# Patient Record
Sex: Female | Born: 1990
Health system: Southern US, Community
[De-identification: ages and names within clinical notes are randomized; demographics above are authoritative.]

## PROBLEM LIST (undated history)

## (undated) ENCOUNTER — Inpatient Hospital Stay (HOSPITAL_COMMUNITY): Payer: Self-pay

## (undated) DIAGNOSIS — N12 Tubulo-interstitial nephritis, not specified as acute or chronic: Secondary | ICD-10-CM

## (undated) DIAGNOSIS — K219 Gastro-esophageal reflux disease without esophagitis: Secondary | ICD-10-CM

## (undated) DIAGNOSIS — G5603 Carpal tunnel syndrome, bilateral upper limbs: Secondary | ICD-10-CM

## (undated) DIAGNOSIS — E039 Hypothyroidism, unspecified: Secondary | ICD-10-CM

## (undated) DIAGNOSIS — Z87448 Personal history of other diseases of urinary system: Secondary | ICD-10-CM

## (undated) DIAGNOSIS — T7840XA Allergy, unspecified, initial encounter: Secondary | ICD-10-CM

## (undated) HISTORY — DX: Hypothyroidism, unspecified: E03.9

## (undated) HISTORY — DX: Gastro-esophageal reflux disease without esophagitis: K21.9

## (undated) HISTORY — DX: Allergy, unspecified, initial encounter: T78.40XA

## (undated) HISTORY — DX: Tubulo-interstitial nephritis, not specified as acute or chronic: N12

---
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
## 2008-02-11 ENCOUNTER — Ambulatory Visit (HOSPITAL_COMMUNITY): Admission: RE | Admit: 2008-02-11 | Discharge: 2008-02-11 | Payer: Self-pay | Admitting: Obstetrics and Gynecology

## 2008-04-03 ENCOUNTER — Emergency Department (HOSPITAL_COMMUNITY): Admission: EM | Admit: 2008-04-03 | Discharge: 2008-04-03 | Payer: Self-pay | Admitting: Emergency Medicine

## 2008-08-03 ENCOUNTER — Inpatient Hospital Stay (HOSPITAL_COMMUNITY): Admission: RE | Admit: 2008-08-03 | Discharge: 2008-08-05 | Payer: Self-pay | Admitting: Obstetrics and Gynecology

## 2008-08-09 ENCOUNTER — Observation Stay (HOSPITAL_COMMUNITY): Admission: AD | Admit: 2008-08-09 | Discharge: 2008-08-10 | Payer: Self-pay | Admitting: Obstetrics and Gynecology

## 2008-11-22 ENCOUNTER — Encounter: Payer: Self-pay | Admitting: Endocrinology

## 2009-01-25 ENCOUNTER — Inpatient Hospital Stay (HOSPITAL_COMMUNITY): Admission: AD | Admit: 2009-01-25 | Discharge: 2009-01-25 | Payer: Self-pay | Admitting: Obstetrics & Gynecology

## 2009-03-04 ENCOUNTER — Emergency Department (HOSPITAL_COMMUNITY): Admission: EM | Admit: 2009-03-04 | Discharge: 2009-03-04 | Payer: Self-pay | Admitting: Family Medicine

## 2009-03-07 ENCOUNTER — Encounter: Payer: Self-pay | Admitting: Endocrinology

## 2009-03-13 ENCOUNTER — Ambulatory Visit: Payer: Self-pay | Admitting: Endocrinology

## 2009-03-13 DIAGNOSIS — N390 Urinary tract infection, site not specified: Secondary | ICD-10-CM | POA: Insufficient documentation

## 2009-03-13 DIAGNOSIS — E039 Hypothyroidism, unspecified: Secondary | ICD-10-CM

## 2009-03-13 HISTORY — DX: Hypothyroidism, unspecified: E03.9

## 2009-04-17 ENCOUNTER — Ambulatory Visit: Payer: Self-pay | Admitting: Endocrinology

## 2009-04-17 LAB — CONVERTED CEMR LAB: TSH: 16.1 microintl units/mL — ABNORMAL HIGH (ref 0.35–5.50)

## 2009-05-31 ENCOUNTER — Ambulatory Visit: Payer: Self-pay | Admitting: Endocrinology

## 2009-06-01 LAB — CONVERTED CEMR LAB: TSH: 2.29 microintl units/mL (ref 0.35–5.50)

## 2009-08-03 ENCOUNTER — Emergency Department (HOSPITAL_COMMUNITY): Admission: EM | Admit: 2009-08-03 | Discharge: 2009-08-03 | Payer: Self-pay | Admitting: Family Medicine

## 2009-12-15 ENCOUNTER — Ambulatory Visit: Payer: Self-pay | Admitting: Family Medicine

## 2009-12-15 ENCOUNTER — Encounter: Payer: Self-pay | Admitting: Family Medicine

## 2009-12-15 LAB — CONVERTED CEMR LAB: TSH: 1.29 microintl units/mL (ref 0.700–6.400)

## 2009-12-18 ENCOUNTER — Encounter: Payer: Self-pay | Admitting: Family Medicine

## 2010-08-08 ENCOUNTER — Telehealth: Payer: Self-pay | Admitting: Family Medicine

## 2010-08-31 ENCOUNTER — Encounter: Payer: Self-pay | Admitting: Family Medicine

## 2010-11-07 ENCOUNTER — Inpatient Hospital Stay (HOSPITAL_COMMUNITY)
Admission: RE | Admit: 2010-11-07 | Discharge: 2010-11-09 | Payer: Self-pay | Source: Home / Self Care | Attending: Obstetrics and Gynecology | Admitting: Obstetrics and Gynecology

## 2010-12-25 NOTE — Letter (Signed)
Summary: Generic Letter  [facility]     [place], [place]    Phone:   Fax:     08/31/2010  Landa Stockley [address] [place], [place]  [address]  Dear Ms. Oleski,    I am writing to inform you that you need to make an appointment to come in for your yearly well-woman visit, which includes a PAP smear. Your most recent PAP smear in 9/10 was normal, but we like to do them every year. Please call our office to schedule an appointment with me, Dr. Fara Boros, your new primary care doctor.  I look forward to meeting you!       Sincerely,   Demetria Pore MD  Appended Document: Generic Letter mailed.

## 2010-12-25 NOTE — Assessment & Plan Note (Signed)
Summary: np,df   Vital Signs:  Patient profile:   20 year old female Height:      62.5 inches Weight:      170 pounds Temp:     98.1 degrees F oral Pulse rate:   61 / minute BP sitting:   110 / 72  (right arm) Cuff size:   regular  Vitals Entered By: Tessie Fass CMA (December 15, 2009 1:45 PM) CC: discuss birth control Is Patient Diabetic? No Pain Assessment Patient in pain? no        CC:  discuss birth control.  History of Present Illness: Kristy Wright is a new patient to our practice.  SHe comes here to establish care and wishes to discuss her thyroid and birth control.  PMH, Meds, FH, SH reviewed and updated.   1) Thyroid - has hypothyroidism since after the birth of her 3rd son in 9/09.  Was being followed by [facility] but for insurance reasons states can't go there anymore unless sent by a PCP.  On levothyroxine .  Has some difficulty remembering to take them and notices increase in fatigue and even depression when she forgets for several days.  She is unclear what the etiology of her hypothyroidism is.  2) birth control - has used nuvaring in the past.  INterested in other options.  Discussed pills, patch, depo, mirena with patient and their pluses/minuses.  LMP 11-20-09. 3) Prevention/general health issues - Last pap at Dr. Ebony Hail office September 2010 and normal per her report.  Denies ever having had an abnormal pap smear.   Habits & Providers  Alcohol-Tobacco-Diet     Tobacco Status: never  Allergies: No Known Drug Allergies  Past History:  Past Medical History: HYPOTHYROIDISM (ICD-244.9) No history of abnormal pap smears  Past Surgical History: none  Family History: GRANDMOTHER WITH CVA AND HTN sister has uncertain type of thyroid condition  does not know parents' history.  see social.  Social History: At age 46 began living with her older sister.  States her mom was never really involved and she doesn't get along with her father.   States they were "crazy parents".  Doesn't know their history.  She has 3 sons born in 2006, 2007, and 2009.  Never smoked.  No drugs or alcohol. Works at [employer].  Completed 10th grade but did not graduate from high school. Smoking Status:  never  Physical Exam  General:  overweight, alert, NAD, vitals reviewed.  Neck:  enlarged thyroid, larger on left.  No masses appreciated. Not tender.  Lungs:  Normal respiratory effort, chest expands symmetrically. Lungs are clear to auscultation, no crackles or wheezes. Heart:  Normal rate and regular rhythm. S1 and S2 normal without gallop, murmur, click, rub or other extra sounds.   Impression & Recommendations:  Problem # 1:  HYPOTHYROIDISM (ICD-244.9) Assessment Unchanged  Last checked July 2010 but dose changed in August.  Will recheck TSH today.  Her updated medication list for this problem includes:    Levothyroxine Sodium 150 Mcg Tabs (Levothyroxine sodium) .Marland Kitchen... 1 qd    Her updated medication list for this problem includes:    Levothyroxine Sodium 150 Mcg Tabs (Levothyroxine sodium) .Marland Kitchen... 1 qd  Orders: [facility] ([id number])  Problem # 2:  CONTRACEPTIVE MANAGEMENT (ICD-V25.09) After discussion of options, patient wishes to continue Nuvaring.  Needs to follow-up in September for pap.   Complete Medication List: 1)  Levothyroxine Sodium 150 Mcg Tabs (Levothyroxine sodium) .Marland Kitchen.. 1 qd 2)  Nuvaring 0.12-0.015 Mg/24hr  Ring (Etonogestrel-ethinyl estradiol) .... Insert one ring in vagina for three weeks, remove for one week, put in new ring as directed disp: 3 month supply  Patient Instructions: 1)  I will let you know when your bloodwork returns. 2)  As long as your blood work is good, you need to return in September for you next pap smear. 3)  It was very nice to meet you today! Prescriptions: NUVARING 0.12-0.015 MG/24HR RING (ETONOGESTREL-ETHINYL ESTRADIOL) insert one ring in vagina for three weeks, remove for one week, put in new ring  as directed disp: 3 month supply  #1 x 3   Entered and Authorized by:   Ardeen Garland  MD   Signed by:   Ardeen Garland  MD on 12/15/2009   Method used:   Print then Give to Patient   RxID:   [id number]

## 2010-12-25 NOTE — Progress Notes (Signed)
  Phone Note Call from Patient   Caller: Patient Call For: [phone] Summary of Call: Having problems keeping bread products on stomach.  Please call back.   Initial call taken by: Britta Mccreedy mcgregor  Follow-up for Phone Call        states any type of bread.pizza, tortillas make her vomit. feels like it is hung in her throat. she has tried chewing small pieces at a time. has been going on x 5 months. better after she vomits. told her to make sure she drinks plenty of water when she eats after each bite. advised avoiding bread until seen. can eat rice, potatos etc. she agreed. wanted early am appt next Tuesday. did not want to wait until Oct. appt made cancel oct appt if she does not need anything else Follow-up by: Golden Circle RN,  August 08, 2010 2:54 PM

## 2010-12-25 NOTE — Letter (Signed)
Summary: Generic Letter  [facility]  [address]   [place], [place] [address]   Phone: [phone]  Fax: [phone]    12/18/2009  Suly Riendeau [address] [place], [place]  [address]  Dear Ms. Reller,  Your thyroid function is normal.  Your current dose of levothyroxine (synthroid) is working well.  If you have any questions, please call our office.          Sincerely,   Ardeen Garland  MD  Appended Document: Generic Letter mailed.

## 2011-01-10 ENCOUNTER — Encounter: Payer: Self-pay | Admitting: *Deleted

## 2011-01-18 ENCOUNTER — Telehealth: Payer: Self-pay | Admitting: Family Medicine

## 2011-01-18 NOTE — Telephone Encounter (Signed)
Pt needs to speak with RN about her thyroid medication, is having a problem getting it

## 2011-01-18 NOTE — Telephone Encounter (Signed)
States she has been out of thyroid meds for over 3 months. Her other md will not fill as she has not been there in over a year. Told her same thing here. Will need to be seen for labs & appropriate dosing of med. She was tearful & states she is home with her children, including a 42 month old & no way to get to md. Spouse is at work. Told her to use UC which is open until 8 & open on weekends. After she gets this done, call for routine visit with pcp here. She agreed with plan

## 2011-01-24 ENCOUNTER — Emergency Department (HOSPITAL_COMMUNITY)
Admission: EM | Admit: 2011-01-24 | Discharge: 2011-01-25 | Disposition: A | Discharge status: Home / Self Care | Payer: Medicaid Other | Attending: Emergency Medicine | Admitting: Emergency Medicine

## 2011-01-24 DIAGNOSIS — N898 Other specified noninflammatory disorders of vagina: Secondary | ICD-10-CM | POA: Insufficient documentation

## 2011-01-24 DIAGNOSIS — E039 Hypothyroidism, unspecified: Secondary | ICD-10-CM | POA: Insufficient documentation

## 2011-01-24 DIAGNOSIS — R5381 Other malaise: Secondary | ICD-10-CM | POA: Insufficient documentation

## 2011-01-24 DIAGNOSIS — Z79899 Other long term (current) drug therapy: Secondary | ICD-10-CM | POA: Insufficient documentation

## 2011-01-24 LAB — POCT I-STAT, CHEM 8
Calcium, Ion: 1.21 mmol/L (ref 1.12–1.32)
Creatinine, Ser: 0.8 mg/dL (ref 0.4–1.2)
Glucose, Bld: 83 mg/dL (ref 70–99)
Hemoglobin: 12.6 g/dL (ref 12.0–15.0)
Potassium: 3.8 mEq/L (ref 3.5–5.1)

## 2011-02-04 LAB — CBC
Hemoglobin: 9 g/dL — ABNORMAL LOW (ref 12.0–15.0)
Hemoglobin: 9.9 g/dL — ABNORMAL LOW (ref 12.0–15.0)
MCV: 87.8 fL (ref 78.0–100.0)
MCV: 88.5 fL (ref 78.0–100.0)
Platelets: 155 10*3/uL (ref 150–400)
RDW: 14 % (ref 11.5–15.5)
RDW: 14.4 % (ref 11.5–15.5)
WBC: 6.7 10*3/uL (ref 4.0–10.5)
WBC: 8.8 10*3/uL (ref 4.0–10.5)

## 2011-02-04 LAB — RPR: RPR Ser Ql: NONREACTIVE

## 2011-02-24 ENCOUNTER — Emergency Department (HOSPITAL_COMMUNITY)
Admission: EM | Admit: 2011-02-24 | Discharge: 2011-02-24 | Disposition: A | Discharge status: Home / Self Care | Payer: Self-pay | Attending: Emergency Medicine | Admitting: Emergency Medicine

## 2011-02-24 ENCOUNTER — Emergency Department (HOSPITAL_COMMUNITY): Payer: Self-pay

## 2011-02-24 DIAGNOSIS — Z79899 Other long term (current) drug therapy: Secondary | ICD-10-CM | POA: Insufficient documentation

## 2011-02-24 DIAGNOSIS — M79609 Pain in unspecified limb: Secondary | ICD-10-CM | POA: Insufficient documentation

## 2011-02-24 DIAGNOSIS — E039 Hypothyroidism, unspecified: Secondary | ICD-10-CM | POA: Insufficient documentation

## 2011-02-24 DIAGNOSIS — W010XXA Fall on same level from slipping, tripping and stumbling without subsequent striking against object, initial encounter: Secondary | ICD-10-CM | POA: Insufficient documentation

## 2011-02-24 DIAGNOSIS — S92309A Fracture of unspecified metatarsal bone(s), unspecified foot, initial encounter for closed fracture: Secondary | ICD-10-CM | POA: Insufficient documentation

## 2011-02-24 DIAGNOSIS — M7989 Other specified soft tissue disorders: Secondary | ICD-10-CM | POA: Insufficient documentation

## 2011-03-06 LAB — POCT I-STAT, CHEM 8
Glucose, Bld: 84 mg/dL (ref 70–99)
HCT: 39 % (ref 36.0–46.0)
Sodium: 140 mEq/L (ref 135–145)
TCO2: 33 mmol/L (ref 0–100)

## 2011-03-06 LAB — DIFFERENTIAL
Basophils Absolute: 0 10*3/uL (ref 0.0–0.1)
Eosinophils Absolute: 0.2 10*3/uL (ref 0.0–0.7)
Eosinophils Relative: 2 % (ref 0–5)
Monocytes Absolute: 0.4 10*3/uL (ref 0.1–1.0)
Monocytes Relative: 6 % (ref 3–12)
Neutrophils Relative %: 53 % (ref 43–77)

## 2011-03-06 LAB — CBC
Hemoglobin: 12.5 g/dL (ref 12.0–15.0)
MCHC: 34.4 g/dL (ref 30.0–36.0)
MCV: 89.3 fL (ref 78.0–100.0)
WBC: 6.8 10*3/uL (ref 4.0–10.5)

## 2011-03-06 LAB — POCT PREGNANCY, URINE: Preg Test, Ur: NEGATIVE

## 2011-03-06 LAB — TSH: TSH: 195.134 u[IU]/mL — ABNORMAL HIGH (ref 0.350–4.500)

## 2011-03-07 LAB — URINALYSIS, ROUTINE W REFLEX MICROSCOPIC
Bilirubin Urine: NEGATIVE
Hgb urine dipstick: NEGATIVE
Ketones, ur: NEGATIVE mg/dL
Nitrite: NEGATIVE
Protein, ur: NEGATIVE mg/dL
Specific Gravity, Urine: 1.02 (ref 1.005–1.030)
Urobilinogen, UA: 0.2 mg/dL (ref 0.0–1.0)

## 2011-03-07 LAB — URINE CULTURE

## 2011-03-07 LAB — POCT PREGNANCY, URINE: Preg Test, Ur: NEGATIVE

## 2011-03-07 LAB — URINE MICROSCOPIC-ADD ON

## 2011-03-07 LAB — GC/CHLAMYDIA PROBE AMP, GENITAL: Chlamydia, DNA Probe: NEGATIVE

## 2011-03-07 LAB — WET PREP, GENITAL

## 2011-03-18 ENCOUNTER — Ambulatory Visit: Payer: Self-pay | Admitting: Family Medicine

## 2011-03-25 ENCOUNTER — Other Ambulatory Visit: Payer: Self-pay

## 2011-03-25 MED ORDER — LEVOTHYROXINE SODIUM 150 MCG PO TABS: 150.0000 ug | ORAL_TABLET | Freq: Every day | ORAL | Status: DC

## 2011-03-25 NOTE — Telephone Encounter (Signed)
Pt requested refill until scheduled ROV

## 2011-04-01 ENCOUNTER — Encounter: Payer: Self-pay | Admitting: *Deleted

## 2011-04-02 ENCOUNTER — Ambulatory Visit (INDEPENDENT_AMBULATORY_CARE_PROVIDER_SITE_OTHER): Payer: Self-pay | Admitting: Endocrinology

## 2011-04-02 ENCOUNTER — Ambulatory Visit: Payer: Self-pay | Admitting: Endocrinology

## 2011-04-02 ENCOUNTER — Encounter: Payer: Self-pay | Admitting: Endocrinology

## 2011-04-02 VITALS — BP 104/70 | HR 54 | Temp 98.4°F | Ht 65.0 in | Wt 162.4 lb

## 2011-04-02 DIAGNOSIS — E042 Nontoxic multinodular goiter: Secondary | ICD-10-CM

## 2011-04-02 DIAGNOSIS — E039 Hypothyroidism, unspecified: Secondary | ICD-10-CM

## 2011-04-02 MED ORDER — LEVOTHYROXINE SODIUM 100 MCG PO TABS: 100.0000 ug | ORAL_TABLET | Freq: Every day | ORAL | Status: DC

## 2011-04-02 NOTE — Progress Notes (Signed)
  Subjective:    Patient ID: Kristy Wright, female    DOB: 1991-05-13, 20 y.o.   MRN: [id number]  HPI Pt is here to f/u hypothyroidism.   She is 5 mos postpartum.  With the pregnancy, she gained 30-40 lbs, and has since re-lost 18 of this.  She reports fatigue.   She was seen at [facility] 2 mos ago.   Past Medical History  Diagnosis Date  . HYPOTHYROIDISM 03/13/2009    No past surgical history on file.  History   Social History  . Marital Status: Single    Spouse Name: N/A    Number of Children: 3  . Years of Education: N/A   Occupational History  . Not on file.   Social History Main Topics  . Smoking status: Never Smoker   . Smokeless tobacco: Not on file  . Alcohol Use: No  . Drug Use: No  . Sexually Active: Not on file   Other Topics Concern  . Not on file   Social History Narrative   At age 59 began living with her older sister. States her mom was never really involved and she doesn't get along with her father. States they were "crazy parents". Doesn't know their history. She has 3 sons born in 2006, 2007, and 2009. Works at [employer]. Completed 10th grade but did not graduate from high school.    No current outpatient prescriptions on file prior to visit.    No Known Allergies  Family History  Problem Relation Age of Onset  . Thyroid disease Sister     uncertain type  . Stroke Other     Grandmother-CVA  . Hypertension Other     Grandmother    BP 104/70  Pulse 54  Temp(Src) 98.4 F (36.9 C) (Oral)  Ht 5\' 5"  (1.651 m)  Wt 162 lb 6.4 oz (73.664 kg)  BMI 27.02 kg/m2  SpO2 97%    Review of Systems Denies leg swelling.     Objective:   Physical Exam GENERAL: no distress General:  normal appearance.   Neck:  thyroid is approx 5x normal size, with a multinodular surface.    Lab Results  Component Value Date   TSH <0.008* 01/24/2011     Assessment & Plan:  Hypothyroidism, overreplaced Multinodular goiter, unchanged Postpartum state.  This  limits eval of sxs.

## 2011-04-02 NOTE — Patient Instructions (Signed)
Skip 5 days of thyroid medication, then resume at 100 mcg/d.   Go to lab in 4-6 weeks for a repeat test.  please call [phone] to hear your test results.  You will be prompted to enter the 9-digit "MRN" number that appears at the top left of this page, followed by #.  Then you will hear the message.

## 2011-04-05 ENCOUNTER — Ambulatory Visit: Payer: Self-pay | Admitting: Endocrinology

## 2011-04-06 DIAGNOSIS — E042 Nontoxic multinodular goiter: Secondary | ICD-10-CM | POA: Insufficient documentation

## 2011-04-09 NOTE — H&P (Signed)
Kristy Wright, Kristy Wright               ACCOUNT NO.:  [id number]   MEDICAL RECORD NO.:  [id number]          PATIENT TYPE:  OBV   LOCATION:  9308                          [facility]   PHYSICIAN:  Malachi Pro. Ambrose Mantle, M.D. DATE OF BIRTH:  [birth year]   DATE OF ADMISSION:  08/09/2008  DATE OF DISCHARGE:                              HISTORY & PHYSICAL   PRESENT ILLNESS:  This is a 20 year old American [place] female para 3-0-  0-3 who had an uncomplicated vaginal delivery on August 03, 2008, and  was discharged from the hospital on August 05, 2008.  She did well at  home until August 08, 2008, when she awakened with a scratchy throat  and had some cough.  On the evening of August 08, 2008, she developed  a fever to 101.1.  She came to our office on the day of admission  denying nausea, vomiting and diarrhea and denying urinary tract  symptoms.  A urinalysis was completely normal.  She was having chills  while she was in the office.   PAST MEDICAL HISTORY:  No known allergies.  No operations.  She has had  frequent UTIs.   FAMILY HISTORY:  Maternal grandmother with high blood pressure, heart  disease, and mother with possible cervical cancer.   ALCOHOL TOBACCO AND DRUGS:  None.   The patient has had three vaginal deliveries.   PHYSICAL EXAMINATION:  VITAL SIGNS:  Temperature in our office was 98.7,  pulse 66, respirations slightly increased at 18.  GENERAL:  The patient is shivering.  HEENT:  No cranial abnormalities.  Extraocular movements intact.  Nose  and pharynx clear.  There is no sign of any exudate in the pharynx.  NECK:  Supple without thyromegaly.  BREASTS:  Very large, but they are not discolored at all, and there is  no specific tenderness.  LUNGS:  Clear to auscultation.  HEART:  Normal size and sounds.  No murmurs.  ABDOMEN:  Soft and nontender.  No masses are palpable.  LOWER EXTREMITIES:  Legs are negative.  PELVIC:  The vulva and vagina are clean.  There is  a slight amount of  lochia present.  The cervix is clean.  The uterus is anterior, almost  normal size in spite of the fact that she delivered 6 days ago.  The  adnexa showed no masses.   ADMITTING IMPRESSION:  Possible flu.  The patient is admitted for  observation, to obtain cultures of the urine and blood, and to treat  with Tamiflu and Tylenol for any fever.      Malachi Pro. Ambrose Mantle, M.D.  Electronically Signed     TFH/MEDQ  D:  08/09/2008  T:  08/09/2008  Job:  [id number]

## 2011-04-09 NOTE — Discharge Summary (Signed)
NAMEKERYN, NESSLER               ACCOUNT NO.:  [id number]   MEDICAL RECORD NO.:  [id number]          PATIENT TYPE:  INP   LOCATION:  9115                          [facility]ILITY:  [facility]   PHYSICIAN:  Malachi Pro. Ambrose Mantle, M.D. DATE OF BIRTH:  12-19-1990   DATE OF ADMISSION:  08/03/2008  DATE OF DISCHARGE:  08/05/2008                               DISCHARGE SUMMARY   This is a 20 year old American [place] female para 2-0-0-2, gravida 3,  EDC August 07, 2008, admitted for induction of labor.  Blood group  and type A+, negative antibody, nonreactive serology, rubella immune,  hepatitis B surface antigen negative, HIV negative, GC and chlamydia  negative, cystic fibrosis negative, 1-hour glucola 91, group B strep  negative, quad screen negative.  Vaginal ultrasound on January 21, 2008, crown-rump length 4.77 cm, 11 weeks 4 days, [facility] August 07, 2008.  The patient had a UTI at approximately 15 weeks' gestation  treated with Keflex and Macrobid.  Ultrasound on March 11, 2008,  gestational age [redacted] weeks 0 days, [facility] August 05, 2008.  Prenatal care,  otherwise, uncomplicated.   PAST MEDICAL HISTORY:  No known allergies.  No operations.   ILLNESSES:  Frequent UTIs.   FAMILY HISTORY:  Maternal grandmother with high blood pressure, heart  disease.  Mother with possible cervical cancer.   Alcohol, tobacco, and drugs none.   OBSTETRIC HISTORY:  In November 2006, a 7-pound-9-ounce female vacuum-  assisted and shoulder dystocia.  In October 2007, a 7-pound-3-ounce  female.   PHYSICAL EXAMINATION:  On admission revealed normal vital signs.  Heart  and lungs were normal.  The abdomen was soft.  Fundal height 38 cm.   On July 28, 2008, fetal heart tones were normal.  The cervix was a  loose fingertip 40% vertex at -3.  Artificial rupture of the membranes  produced clear fluid.  The patient was on Pitocin.  She reached 4 cm,  received an epidural, and progressed rapidly to full dilatation.  She  delivered spontaneously OA over an intact perineum by Dr. Ambrose Mantle.  A  living female infant, Apgars of 9 at 1 and 9 at 5 minutes.  Baby's weight  was 7 pounds 8 ounces.  Placenta was intact.  Uterus normal.  Blood loss  about 400 mL.  Postpartum, the patient did well and was discharged on  the second postpartum day.   LABORATORY DATA:  Initial hemoglobin 9.8, hematocrit 29.4, white count  8400, platelet count 177,000.  RPR was nonreactive.  Followup hemoglobin  9.4.   FINAL DIAGNOSIS:  Intrauterine pregnancy 39 plus weeks delivered,  occipitoanterior.   OPERATION:  Spontaneous delivery, occipitoanterior.   FINAL CONDITION:  Improved.   INSTRUCTIONS:  Our regular discharge instruction booklet.  The patient  is advised to take ferrous sulfate twice a day for 3 months, Micronor 1  a day, Motrin 600 mg 30 tablets 1 every 6 hours as needed for pain, and  Percocet 5/325, #30 tablets 1 every 4-6 hours as needed for pain.  She  is to return in 6 weeks for followup examination.  Malachi Pro. Ambrose Mantle, M.D.  Electronically Signed     TFH/MEDQ  D:  08/05/2008  T:  08/05/2008  Job:  [id number]

## 2011-04-09 NOTE — Discharge Summary (Signed)
Kristy Wright, Kristy Wright               ACCOUNT NO.:  [id number]   MEDICAL RECORD NO.:  [id number]          PATIENT TYPE:  OBV   LOCATION:  9308                          [facility]   PHYSICIAN:  Malachi Pro. Ambrose Mantle, M.D. DATE OF BIRTH:  February 19, 1991   DATE OF ADMISSION:  08/09/2008  DATE OF DISCHARGE:  08/10/2008                               DISCHARGE SUMMARY   This is a 20 year old American [place] female para 3-0-0-3 who was  admitted to the hospital on August 09, 2008, with shaking chills and  fever to 102.3 degrees.  Cath urine was negative.  Blood cultures were  obtained.  CBC and comprehensive metabolic profile were obtained.  The  patient was begun on Tamiflu because of her history of scratchy throat,  cough, and fever.  On the day after admission, she remained afebrile  throughout her hospital course.  She was admitted at 102.3 and since  then has not had a fever over slightly above 100 and at 12 noon today is  98.7.  Blood cultures are pending.  Urine culture is pending.  Comprehensive metabolic profile was normal except for sodium of 134,  albumin of 3.0.  White count was normal at 10,100, hemoglobin was 11.8.   Physical exam was essentially normal except when I admitted the patient  was having shaking chills and fever.   IMPRESSION:  Influenza.   OPERATIONS:  None.   FINAL CONDITION:  Improved.   INSTRUCTIONS:  Include Tamiflu 75 mg twice a day for 3-1/2 more days.  Call with any fever above 100.4.  Call with any unusual symptoms.  Return to the office in 6 weeks if her fever stays away and her symptoms  continue to improve.      Malachi Pro. Ambrose Mantle, M.D.  Electronically Signed     TFH/MEDQ  D:  08/10/2008  T:  08/10/2008  Job:  [id number]

## 2011-04-12 NOTE — Discharge Summary (Signed)
Kristy Wright, Kristy Wright NO.:  [id number]   MEDICAL RECORD NO.:  [id number]            PATIENT TYPE:   LOCATION:                                 FACILITY:   PHYSICIAN:  Zenaida Niece, M.D.     DATE OF BIRTH:   DATE OF ADMISSION:  10/15/2005  DATE OF DISCHARGE:  10/17/2005                                 DISCHARGE SUMMARY   ADMISSION DIAGNOSIS:  Intrauterine pregnancy at 40 weeks, possible small for  gestational age.   DISCHARGE DIAGNOSIS:  Intrauterine pregnancy at 40 weeks, possible small for  gestational age.   PROCEDURES:  On October 16, 2005, she had a vacuum-assisted vaginal  delivery and repair of lacerations by Dr. Ambrose Mantle.   HISTORY AND PHYSICAL:  This is a 20 year old gravida 1, para 0 with an EGA  of [redacted] weeks who presents for induction by Dr. Ambrose Mantle for favorable cervix  and possible small for gestational age infant. Prenatal care essentially  uncomplicated except for recently measuring small.   PRENATAL LABS:  Blood type is A+ with a negative antibody screen, RPR  nonreactive, rubella immune, hepatitis B surface antigen negative, HIV  negative, gonorrhea and chlamydia negative, triple screen was declined, 1-  hour Glucola 76, group B strep is negative.   PAST HISTORY:  Is essentially noncontributory.   PHYSICAL EXAM:  VITAL SIGNS:  She is afebrile with stable vital signs.  ABDOMEN:  Gravid with a fundal height of 35 cm and fetal heart tracing was  normal.  PELVIC:  Cervix was FT, 25-50% effaced and -2 station, vertex presentation.   HOSPITAL COURSE:  The patient was admitted and started on Pitocin. When she  progressed to 2 cm, Dr. Ambrose Mantle performed amniotomy also for augmentation.  She progressed to complete and pushed but due to fetal bradycardia, Dr.  Ambrose Mantle performed a vacuum-assisted vaginal delivery. This delivered a viable  female infant with Apgars of 08/09, weight 7 pounds 9 ounces. There was a mild  shoulder dystocia which resolved with  McRobert's maneuver and wood screw. A  second-degree midline perineal and left labial lacerations were repaired  with 3-0 Vicryl. Postpartum, she had no significant complications and  requested discharge on postpartum day #1. Her baby was also felt to be  stable enough for discharge and she was felt to be stable enough for  discharge home. Predelivery hemoglobin 12.3, postdelivery 11.5.   DISCHARGE INSTRUCTIONS:  Regular diet, pelvic rest and follow-up in 6 weeks.   DISCHARGE MEDICATIONS:  1.  Percocet #20 one to two p.o. q. 4-6 hours p.r.n. pain.  2.  Over-the-counter ibuprofen as needed.   She is also given our discharge pamphlet.      Zenaida Niece, M.D.  Electronically Signed     TDM/MEDQ  D:  11/13/2005  T:  11/14/2005  Job:  [id number]

## 2011-04-12 NOTE — Discharge Summary (Signed)
Kristy Wright, Kristy Wright               ACCOUNT NO.:  [id number]   MEDICAL RECORD NO.:  [id number]          PATIENT TYPE:  INP   LOCATION:  9102                          [facility]   PHYSICIAN:  Malachi Pro. Ambrose Mantle, M.D. DATE OF BIRTH:  1991-11-21   DATE OF ADMISSION:  09/16/2006  DATE OF DISCHARGE:  09/18/2006                                 DISCHARGE SUMMARY   This is a 20 year old [place] single female, para 1-0-0-1, gravida  2, [facility] September 21, 2006, by ultrasound and 18+ week ultrasound, admitted for  induction of labor.  Blood group and type A+, negative antibody.  Nonreactive serology.  Rubella immune.  Hepatitis B surface antigen  negative.  HIV negative.  GC and chlamydia negative.  One-hour Glucola 83.  Group B strep negative.  Prenatal care began at our office on June 10, 2006.  Ultrasound on Apr 24, 2006, had shown average gestational age of [redacted] weeks 4  days, [facility] September 21, 2006.  Prenatal care was uncomplicated.  The patient  was admitted on the morning of admission and begun on Pitocin.  By 8:28 a.m.  the Pitocin was at 8 milliunits a minute.  Contractions were every 4  minutes.   PAST MEDICAL HISTORY:  No known allergies.  No operations.  Frequent UTIs.   Alcohol, tobacco and drugs:  None.   FAMILY HISTORY:  Maternal grandmother with heart disease, high blood  pressure and diabetes.  Mother with possible cervical cancer.   OBSTETRIC HISTORY:  In November 2006 she had a 7 pound 9 ounce female  delivered vaginally with vacuum assistance and mild shoulder dystocia.   On admission her vital signs were normal.  Heart and lungs were normal.  The  abdomen was soft.  Fundal right on September 10, 2006, was 37 cm.  Fetal heart  tones were normal.  There were good accelerations and no decelerations.  Cervix was 2 cm, 40%, vertex at a -2.  Artificial rupture of the membranes  produced clear fluid.  At 8:20 a.m., the patient began having some late  decelerations of the fetal  heart rate, good variability remained.  Position  was altered and the decelerations corrected.  The patient requested an  epidural when she reached 3 cm.  The epidural was given.  By 12:20 p.m. the  cervix was 4-5 cm.  She progressed to full dilatation with the vertex  directly OP.  She pushed well and the vertex rotated to LOA.  She delivered  spontaneously LOA over an intact perineum by Dr. Ambrose Mantle a living female infant  at 7 pounds 3 ounces with Apgars of 8 at one and 9 at five minutes.  Placenta was delivered intact.  The uterus was normal.  Superficial upper  labial lacerations were hemostatic and not repaired.  Blood loss about 500  mL.  Postpartum the patient did well and was discharged on the second  postpartum day.   LABORATORY DATA:  Initial hemoglobin 11.5, hematocrit 33.0, white count  8200, platelet count 120,000.  Follow-up hemoglobin 10.9, platelet count  102,000, RPR nonreactive.   FINAL DIAGNOSIS:  Intrauterine pregnancy, 39+ weeks, delivered left occiput  anterior.   OPERATION:  Spontaneous delivery, left occiput anterior.   FINAL CONDITION:  Improved.   Instructions include our regular discharge instruction booklet.  Percocet  5/325 mg 24 tablets, one every 4-6 hours as needed for pain at discharge.  The patient is advised birth control options.  She elects to use condoms and  foam prior to insertion of an IUD, and I have informed her that I do not  insert the IUD less than 9 weeks postpartum.      Malachi Pro. Ambrose Mantle, M.D.  Electronically Signed     TFH/MEDQ  D:  09/18/2006  T:  09/19/2006  Job:  [id number]

## 2011-06-26 ENCOUNTER — Inpatient Hospital Stay (INDEPENDENT_AMBULATORY_CARE_PROVIDER_SITE_OTHER)
Admission: RE | Admit: 2011-06-26 | Discharge: 2011-06-26 | Disposition: A | Discharge status: Home / Self Care | Payer: Self-pay | Source: Ambulatory Visit | Attending: Family Medicine | Admitting: Family Medicine

## 2011-06-26 DIAGNOSIS — H109 Unspecified conjunctivitis: Secondary | ICD-10-CM

## 2011-08-15 LAB — ABO/RH: ABO/RH(D): A POS

## 2011-08-15 LAB — CBC
HCT: 36.2
Hemoglobin: 12.7
MCV: 86.3
RDW: 13.6

## 2011-08-15 LAB — POCT PREGNANCY, URINE: Operator id: 181461

## 2011-08-15 LAB — URINALYSIS, ROUTINE W REFLEX MICROSCOPIC
Bilirubin Urine: NEGATIVE
Hgb urine dipstick: NEGATIVE
Ketones, ur: NEGATIVE
Specific Gravity, Urine: 1.025
Urobilinogen, UA: 0.2

## 2011-08-15 LAB — WET PREP, GENITAL
Clue Cells Wet Prep HPF POC: NONE SEEN
Trich, Wet Prep: NONE SEEN

## 2011-08-26 LAB — COMPREHENSIVE METABOLIC PANEL
AST: 30
Albumin: 3 — ABNORMAL LOW
Calcium: 8.9
Creatinine, Ser: 0.69
GFR calc Af Amer: >60
Total Protein: 6.9

## 2011-08-26 LAB — URINE CULTURE
Colony Count: NO GROWTH
Special Requests: NEGATIVE

## 2011-08-26 LAB — URINALYSIS, MICROSCOPIC ONLY
Glucose, UA: NEGATIVE
Ketones, ur: 15 — AB
Protein, ur: NEGATIVE

## 2011-08-26 LAB — CULTURE, BLOOD (ROUTINE X 2): Culture: NO GROWTH

## 2011-08-26 LAB — CBC
MCHC: 32.5
MCV: 86.8
Platelets: 190

## 2011-08-28 LAB — CBC
HCT: 28.2 — ABNORMAL LOW
Hemoglobin: 9.8 — ABNORMAL LOW
MCHC: 33.3
MCV: 87.3
Platelets: 160
RBC: 3.39 — ABNORMAL LOW
WBC: 9.4

## 2011-09-02 LAB — GC/CHLAMYDIA PROBE AMP, GENITAL: GC Probe Amp, Genital: NEGATIVE

## 2011-09-02 LAB — POCT URINALYSIS DIP (DEVICE)
Bilirubin Urine: NEGATIVE
Glucose, UA: NEGATIVE
Ketones, ur: NEGATIVE
Nitrite: NEGATIVE
Protein, ur: NEGATIVE
Urobilinogen, UA: 0.2
pH: 5.5

## 2011-09-05 ENCOUNTER — Telehealth: Payer: Self-pay | Admitting: Family Medicine

## 2011-09-05 ENCOUNTER — Inpatient Hospital Stay (INDEPENDENT_AMBULATORY_CARE_PROVIDER_SITE_OTHER)
Admission: RE | Admit: 2011-09-05 | Discharge: 2011-09-05 | Disposition: A | Discharge status: Home / Self Care | Payer: Self-pay | Source: Ambulatory Visit | Attending: Family Medicine | Admitting: Family Medicine

## 2011-09-05 DIAGNOSIS — N390 Urinary tract infection, site not specified: Secondary | ICD-10-CM

## 2011-09-05 DIAGNOSIS — R109 Unspecified abdominal pain: Secondary | ICD-10-CM

## 2011-09-05 LAB — POCT URINALYSIS DIP (DEVICE)
Bilirubin Urine: NEGATIVE
Glucose, UA: NEGATIVE mg/dL
Hgb urine dipstick: NEGATIVE
Ketones, ur: NEGATIVE mg/dL
Nitrite: NEGATIVE
Specific Gravity, Urine: 1.02 (ref 1.005–1.030)
pH: 7 (ref 5.0–8.0)

## 2011-09-05 NOTE — Telephone Encounter (Signed)
Spoke with patient .  She will not be able to go to Urgent Care until her husband gets home at 5:00.  Advised her to take it easy and if she is having any dizziness now to be careful changing positions. Reports pain on right side just under rib cage area when she coughs or sneezes.  Advised to go to UC as soon as possible today.

## 2011-09-05 NOTE — Telephone Encounter (Signed)
Woke up in the middle of night with severe belly pain under her belly button.  When she coughs, she has sharp pain on the right side.  It caused her to pass out.  She is going to plan on going to Urgent Care this evening but still wants to talk to someone.

## 2011-09-07 LAB — URINE CULTURE
Colony Count: >=100000
Culture  Setup Time: 201210112046

## 2011-11-21 ENCOUNTER — Other Ambulatory Visit: Payer: Self-pay | Admitting: Endocrinology

## 2011-11-21 MED ORDER — LEVOTHYROXINE SODIUM 100 MCG PO TABS: 100.0000 ug | ORAL_TABLET | Freq: Every day | ORAL | Status: DC

## 2011-11-21 NOTE — Telephone Encounter (Signed)
Rx sent to [facility] on [address]. Pt informed.

## 2011-11-21 NOTE — Telephone Encounter (Signed)
The pt called and is hoping to get a refill of Synthroid.  She has made an appt for Jan 4th.  Her callback number is [phone]  Thanks!

## 2011-11-29 ENCOUNTER — Ambulatory Visit: Payer: Medicaid Other | Admitting: Endocrinology

## 2012-01-31 ENCOUNTER — Encounter (HOSPITAL_COMMUNITY): Payer: Self-pay

## 2012-01-31 ENCOUNTER — Emergency Department (INDEPENDENT_AMBULATORY_CARE_PROVIDER_SITE_OTHER)
Admission: EM | Admit: 2012-01-31 | Discharge: 2012-01-31 | Disposition: A | Discharge status: Home Health | Payer: Self-pay | Source: Home / Self Care | Attending: Emergency Medicine | Admitting: Emergency Medicine

## 2012-01-31 DIAGNOSIS — O210 Mild hyperemesis gravidarum: Secondary | ICD-10-CM

## 2012-01-31 DIAGNOSIS — O2390 Unspecified genitourinary tract infection in pregnancy, unspecified trimester: Secondary | ICD-10-CM

## 2012-01-31 DIAGNOSIS — N390 Urinary tract infection, site not specified: Secondary | ICD-10-CM

## 2012-01-31 DIAGNOSIS — O2340 Unspecified infection of urinary tract in pregnancy, unspecified trimester: Secondary | ICD-10-CM

## 2012-01-31 DIAGNOSIS — O219 Vomiting of pregnancy, unspecified: Secondary | ICD-10-CM

## 2012-01-31 DIAGNOSIS — Z331 Pregnant state, incidental: Secondary | ICD-10-CM

## 2012-01-31 LAB — POCT URINALYSIS DIP (DEVICE)
Bilirubin Urine: NEGATIVE
Hgb urine dipstick: NEGATIVE
Nitrite: NEGATIVE
Protein, ur: NEGATIVE mg/dL
pH: 5.5 (ref 5.0–8.0)

## 2012-01-31 MED ORDER — ONDANSETRON HCL 4 MG PO TABS: 4.0000 mg | ORAL_TABLET | Freq: Three times a day (TID) | ORAL | Status: AC | PRN

## 2012-01-31 MED ORDER — NITROFURANTOIN MONOHYD MACRO 100 MG PO CAPS: 100.0000 mg | ORAL_CAPSULE | Freq: Two times a day (BID) | ORAL | Status: AC

## 2012-01-31 MED ORDER — PRENATAL VITAMINS PLUS 27-1 MG PO TABS: 1.0000 | ORAL_TABLET | Freq: Every day | ORAL | Status: DC

## 2012-01-31 NOTE — ED Provider Notes (Signed)
History     CSN: [id number]  Arrival date & time 01/31/12  1407   First MD Initiated Contact with Patient 01/31/12 1423      Chief Complaint  Patient presents with  . Emesis    (Consider location/radiation/quality/duration/timing/severity/associated sxs/prior treatment) HPI Comments: Patient reports intermittent nausea, decreased appetite, and states she vomits when she eats large amounts of foods starting over the past week. Is able to eat small amounts of bland foods. No fevers, abdominal pain, urinary complaints, back pain, vaginal bleeding, vaginal discharge. No change in urine output. Patient states that her younger son has vomiting and diarrhea currently. Patient's LMP 1/29., But patient states that she has abnormal  menses  due to her hypothyroidism.  Patient states she ran out of her thyroid medication last month, and is requesting refills.   ROS as noted in HPI. All other ROS negative.   The history is provided by the patient.    Past Medical History  Diagnosis Date  . HYPOTHYROIDISM 03/13/2009    History reviewed. No pertinent past surgical history.  Family History  Problem Relation Age of Onset  . Thyroid disease Sister     uncertain type  . Stroke Other     Grandmother-CVA  . Hypertension Other     Grandmother    History  Substance Use Topics  . Smoking status: Never Smoker   . Smokeless tobacco: Not on file  . Alcohol Use: No    OB History    Grav Para Term Preterm Abortions TAB SAB Ect Mult Living                  Review of Systems  Allergies  Review of patient's allergies indicates no known allergies.  Home Medications   Current Outpatient Rx  Name Route Sig Dispense Refill  . LEVOTHYROXINE SODIUM 100 MCG PO TABS Oral Take 1 tablet (100 mcg total) by mouth daily. 30 tablet 2  . NITROFURANTOIN MONOHYD MACRO 100 MG PO CAPS Oral Take 1 capsule (100 mg total) by mouth 2 (two) times daily. 10 capsule 0  . ONDANSETRON HCL 4 MG PO TABS Oral Take 1  tablet (4 mg total) by mouth every 8 (eight) hours as needed for nausea. May cause constipation 20 tablet 0  . PRENATAL VITAMINS PLUS 27-1 MG PO TABS Oral Take 1 tablet by mouth daily. 1 tab po once daily 30 tablet 0    BP 113/71  Pulse 82  Temp(Src) 98.3 F (36.8 C) (Oral)  Resp 16  SpO2 99%  LMP 12/24/2011  Physical Exam  Nursing note and vitals reviewed. Constitutional: She is oriented to person, place, and time. She appears well-developed and well-nourished. No distress.  HENT:  Head: Normocephalic and atraumatic.       Mucous membranes moist.  Eyes: Conjunctivae and EOM are normal.  Neck: Normal range of motion. Neck supple.  Cardiovascular: Normal rate, regular rhythm, normal heart sounds and intact distal pulses.   Pulmonary/Chest: Effort normal and breath sounds normal.  Abdominal: Soft. Bowel sounds are normal. She exhibits no distension. There is no tenderness. There is no rebound, no guarding and no CVA tenderness.  Musculoskeletal: Normal range of motion.  Neurological: She is alert and oriented to person, place, and time.  Skin: Skin is warm and dry.       Good skin turgor  Psychiatric: She has a normal mood and affect. Her behavior is normal. Judgment and thought content normal.    ED Course  Procedures (including  critical care time)  Labs Reviewed  POCT PREGNANCY, URINE - Abnormal; Notable for the following:    Preg Test, Ur POSITIVE (*)    All other components within normal limits  POCT URINALYSIS DIP (DEVICE) - Abnormal; Notable for the following:    Leukocytes, UA TRACE (*) Biochemical Testing Only. Please order routine urinalysis from main lab if confirmatory testing is needed.   All other components within normal limits   No results found.   1. Pregnancy as incidental finding   2. Nausea and vomiting in pregnancy prior to [redacted] weeks gestation   3. UTI in pregnancy       MDM  Discussed lab findings with the patient.  patient is 5 weeks, 3 days  pregnant by LMP. Patient is G4 P4. H&P most consistent with morning sickness, normal pregnancy. No evidence of dehydration, hyperemesis gravidarum. No history of STDs, ectopic pregnancies. Patient with no abdominal pain, vaginal bleeding. home with prenatal vitamins and Zofran. Will send her home with Macrobid for the bacteruria. Patient states she has an OB/GYN in with whom she will followup. Sending her home with a copy of her lab results. Per chart review, patient has 2 more refills of her Synthroid. Patient did not know that she had these available.   Domenick Gong, MD 01/31/12 [phone]

## 2012-01-31 NOTE — ED Notes (Addendum)
C/o vomiting whenever she eats since Friday- states has been intermittent and she has occasionally felt light headed. Last vomited this am.  Denies diarrhea , fever or abdominal pain.  States initially she had chills.  Her son recently had diarrhea and vomiting.

## 2012-01-31 NOTE — Discharge Instructions (Signed)
Followup with your OB/GYN for ongoing care. Ginger is often very useful for nausea. Make sure you finish the Macrobid even if you feel better. Return if you're unable to keep any fluids down, if you stop urinating, or for any other concerns.  Go to www.goodrx.com to look up your medications. This will give you a list of where you can find your prescriptions at the most affordable prices.

## 2012-03-05 ENCOUNTER — Inpatient Hospital Stay (HOSPITAL_COMMUNITY)
Admission: AD | Admit: 2012-03-05 | Discharge: 2012-03-05 | Disposition: A | Discharge status: Home / Self Care | Payer: Medicaid Other | Source: Ambulatory Visit | Attending: Obstetrics & Gynecology | Admitting: Obstetrics & Gynecology

## 2012-03-05 ENCOUNTER — Encounter (HOSPITAL_COMMUNITY): Payer: Self-pay

## 2012-03-05 ENCOUNTER — Inpatient Hospital Stay (HOSPITAL_COMMUNITY): Payer: Medicaid Other

## 2012-03-05 DIAGNOSIS — B9689 Other specified bacterial agents as the cause of diseases classified elsewhere: Secondary | ICD-10-CM | POA: Insufficient documentation

## 2012-03-05 DIAGNOSIS — O4690 Antepartum hemorrhage, unspecified, unspecified trimester: Secondary | ICD-10-CM

## 2012-03-05 DIAGNOSIS — N760 Acute vaginitis: Secondary | ICD-10-CM | POA: Insufficient documentation

## 2012-03-05 DIAGNOSIS — E039 Hypothyroidism, unspecified: Secondary | ICD-10-CM | POA: Insufficient documentation

## 2012-03-05 DIAGNOSIS — O2390 Unspecified genitourinary tract infection in pregnancy, unspecified trimester: Secondary | ICD-10-CM | POA: Insufficient documentation

## 2012-03-05 DIAGNOSIS — O209 Hemorrhage in early pregnancy, unspecified: Secondary | ICD-10-CM | POA: Insufficient documentation

## 2012-03-05 DIAGNOSIS — A499 Bacterial infection, unspecified: Secondary | ICD-10-CM | POA: Insufficient documentation

## 2012-03-05 HISTORY — DX: Hypothyroidism, unspecified: E03.9

## 2012-03-05 LAB — URINALYSIS, ROUTINE W REFLEX MICROSCOPIC
Bilirubin Urine: NEGATIVE
Ketones, ur: NEGATIVE mg/dL
Leukocytes, UA: NEGATIVE
Nitrite: NEGATIVE
Protein, ur: NEGATIVE mg/dL

## 2012-03-05 LAB — WET PREP, GENITAL
Trich, Wet Prep: NONE SEEN
Yeast Wet Prep HPF POC: NONE SEEN

## 2012-03-05 LAB — CBC
HCT: 33.6 % — ABNORMAL LOW (ref 36.0–46.0)
MCH: 29.2 pg (ref 26.0–34.0)
MCHC: 33.6 g/dL (ref 30.0–36.0)
RDW: 13.6 % (ref 11.5–15.5)

## 2012-03-05 LAB — POCT PREGNANCY, URINE: Preg Test, Ur: POSITIVE — AB

## 2012-03-05 MED ORDER — METRONIDAZOLE 500 MG PO TABS: 500.0000 mg | ORAL_TABLET | Freq: Two times a day (BID) | ORAL | Status: AC

## 2012-03-05 NOTE — MAU Note (Signed)
Pt states upper abd pain that began this am, LMP-12/18/2011, unsure if she is pregnant, denies vag d/c changes, noted spotting light yesterday. Pain is intermittent and feels like a tightening. No prior hx of ptl.

## 2012-03-05 NOTE — Discharge Instructions (Signed)
Vaginal Bleeding During Pregnancy  A small amount of bleeding from the vagina can happen anytime during pregnancy. Be sure to tell your doctor about all vaginal bleeding.   HOME CARE   Get plenty of rest and sleep.   Count the number of pads you use each day. Do not use tampons.   Save any tissue you pass for your doctor to see.   Do not exercise   Do not do any heavy lifting.   Avoid going up and down stairs. If you must climb stairs, go slowly.   Do not have sex (intercourse) or orgasms until approved by your doctor.   Do not douche.   Only take medicine as told by your doctor. Do not take aspirin.   Eat healthy.   Always keep your follow-up appointments.  GET HELP RIGHT AWAY IF:    You feel the baby moving less or not moving at all.   The bleeding gets worse.   You have very painful cramps or pain in your stomach or back.   You pass large clots or anything that looks like tissue.   You have a temperature by mouth above 102 F (38.9 C).   You feel very weak.   You have chills.   You feel dizzy or pass out (faint).   You have a gush of fluid from the vagina.  MAKE SURE YOU:    Understand these instructions.   Will watch your condition.   Will get help right away if you are not doing well or get worse.  Document Released: 08/20/2008 Document Revised: 10/31/2011 Document Reviewed: 10/17/2009  [employer] Patient Information 2012 [employer].

## 2012-03-05 NOTE — MAU Provider Note (Signed)
History   Pt presents today c/o vag bleeding yesterday. She states the bleeding was more than spotting. Her last episode of intercourse was 2 days ago. She now c/o upper abd pain when she walks and she also feels dizzy. She does report some nausea. She denies fever or any other sx at this time.  CSN: [id number]  Arrival date and time: 03/05/12 1814   None     Chief Complaint  Patient presents with  . Abdominal Pain   HPI  OB History    Grav Para Term Preterm Abortions TAB SAB Ect Mult Living   5 4 4       4       Past Medical History  Diagnosis Date  . HYPOTHYROIDISM 03/13/2009  . Thyroid activity decreased   . Thyroid activity decreased     History reviewed. No pertinent past surgical history.  Family History  Problem Relation Age of Onset  . Thyroid disease Sister     uncertain type  . Stroke Other     Grandmother-CVA  . Hypertension Other     Grandmother    History  Substance Use Topics  . Smoking status: Never Smoker   . Smokeless tobacco: Never Used  . Alcohol Use: No    Allergies: No Known Allergies  Prescriptions prior to admission  Medication Sig Dispense Refill  . levothyroxine (SYNTHROID, LEVOTHROID) 100 MCG tablet Take 1 tablet (100 mcg total) by mouth daily.  30 tablet  2  . Prenatal Vit-Fe Fumarate-FA (PRENATAL MULTIVITAMIN) TABS Take 1 tablet by mouth at bedtime.      Marland Kitchen DISCONTD: Prenatal Vit-Fe Fumarate-FA (PRENATAL VITAMINS PLUS) 27-1 MG TABS Take 1 tablet by mouth daily. 1 tab po once daily  30 tablet  0    Review of Systems  Constitutional: Negative for fever and chills.  Eyes: Negative for blurred vision and double vision.  Respiratory: Negative for cough, hemoptysis, sputum production, shortness of breath and wheezing.   Cardiovascular: Negative for chest pain and palpitations.  Gastrointestinal: Positive for nausea and abdominal pain. Negative for vomiting, diarrhea, constipation and blood in stool.  Genitourinary: Negative for  dysuria, urgency, frequency and hematuria.  Neurological: Positive for dizziness. Negative for headaches.  Psychiatric/Behavioral: Negative for depression and suicidal ideas.   Physical Exam   Blood pressure 112/73, pulse 69, temperature 97.2 F (36.2 C), temperature source Oral, resp. rate 20, height 5\' 5"  (1.651 m), weight 165 lb (74.844 kg), last menstrual period 12/18/2011, SpO2 100.00%.  Physical Exam  Nursing note and vitals reviewed. Constitutional: She is oriented to person, place, and time. She appears well-developed and well-nourished. No distress.  HENT:  Head: Normocephalic and atraumatic.  Eyes: EOM are normal. Pupils are equal, round, and reactive to light.  GI: Soft. She exhibits no distension and no mass. There is no tenderness. There is no rebound and no guarding.  Genitourinary: No bleeding around the vagina. Vaginal discharge found.       Cervix Lg/closed. No bleeding noted. Copious amount of yellowish vag dc present.  Neurological: She is alert and oriented to person, place, and time.  Skin: Skin is warm and dry. She is not diaphoretic.  Psychiatric: She has a normal mood and affect. Her behavior is normal. Judgment and thought content normal.    MAU Course  Procedures  Wet prep and GC/Chlamydia culture done.  Results for orders placed during the hospital encounter of 03/05/12 (from the past 24 hour(s))  URINALYSIS, ROUTINE W REFLEX MICROSCOPIC  Status: Abnormal   Collection Time   03/05/12  6:41 PM      Component Value Range   Color, Urine YELLOW  YELLOW    APPearance CLEAR  CLEAR    Specific Gravity, Urine >1.030 (*) 1.005 - 1.030    pH 6.0  5.0 - 8.0    Glucose, UA NEGATIVE  NEGATIVE (mg/dL)   Hgb urine dipstick NEGATIVE  NEGATIVE    Bilirubin Urine NEGATIVE  NEGATIVE    Ketones, ur NEGATIVE  NEGATIVE (mg/dL)   Protein, ur NEGATIVE  NEGATIVE (mg/dL)   Urobilinogen, UA 0.2  0.0 - 1.0 (mg/dL)   Nitrite NEGATIVE  NEGATIVE    Leukocytes, UA NEGATIVE   NEGATIVE   POCT PREGNANCY, URINE     Status: Abnormal   Collection Time   03/05/12  6:46 PM      Component Value Range   Preg Test, Ur POSITIVE (*) NEGATIVE   CBC     Status: Abnormal   Collection Time   03/05/12  8:53 PM      Component Value Range   WBC 5.7  4.0 - 10.5 (K/uL)   RBC 3.87  3.87 - 5.11 (MIL/uL)   Hemoglobin 11.3 (*) 12.0 - 15.0 (g/dL)   HCT 16.1 (*) 09.6 - 46.0 (%)   MCV 86.8  78.0 - 100.0 (fL)   MCH 29.2  26.0 - 34.0 (pg)   MCHC 33.6  30.0 - 36.0 (g/dL)   RDW 04.5  40.9 - 81.1 (%)   Platelets 157  150 - 400 (K/uL)  WET PREP, GENITAL     Status: Abnormal   Collection Time   03/05/12  9:03 PM      Component Value Range   Yeast Wet Prep HPF POC NONE SEEN  NONE SEEN    Trich, Wet Prep NONE SEEN  NONE SEEN    Clue Cells Wet Prep HPF POC MODERATE (*) NONE SEEN    WBC, Wet Prep HPF POC MANY (*) NONE SEEN     US shows single IUP with good cardiac activity. NL cervical length. Assessment and Plan  Bleeding in preg: discussed with pt at length. No evidence of bleeding at this time. She will f/u with her OB provider.   BV: will tx with Flagyl. Warned of antabuse reaction. Discussed diet, activity, risks, and precautions.  Clinton Gallant. Early Ord III, DrHSc, MPAS, PA-C  03/05/2012, 8:52 PM

## 2012-03-06 LAB — GC/CHLAMYDIA PROBE AMP, GENITAL: Chlamydia, DNA Probe: NEGATIVE

## 2012-03-07 ENCOUNTER — Encounter (HOSPITAL_COMMUNITY): Payer: Self-pay | Admitting: Emergency Medicine

## 2012-03-07 ENCOUNTER — Emergency Department (HOSPITAL_COMMUNITY)
Admission: EM | Admit: 2012-03-07 | Discharge: 2012-03-07 | Disposition: A | Discharge status: Home / Self Care | Payer: Medicaid Other | Attending: Emergency Medicine | Admitting: Emergency Medicine

## 2012-03-07 DIAGNOSIS — E039 Hypothyroidism, unspecified: Secondary | ICD-10-CM | POA: Insufficient documentation

## 2012-03-07 DIAGNOSIS — J029 Acute pharyngitis, unspecified: Secondary | ICD-10-CM | POA: Insufficient documentation

## 2012-03-07 LAB — RAPID STREP SCREEN (MED CTR MEBANE ONLY): Streptococcus, Group A Screen (Direct): NEGATIVE

## 2012-03-07 MED ORDER — LIDOCAINE VISCOUS 2 % MT SOLN: 20.0000 mL | OROMUCOSAL | Status: AC | PRN

## 2012-03-07 NOTE — ED Notes (Signed)
Mother here with son. States she has had sore throat x 2 days with decreased intake. Fever has been "off and on" Denies N/V/D.

## 2012-03-07 NOTE — ED Provider Notes (Signed)
History     CSN: [id number]  Arrival date & time 03/07/12  1607   First MD Initiated Contact with Patient 03/07/12 1750      Chief Complaint  Patient presents with  . Sore Throat  . Fever  . Headache    (Consider location/radiation/quality/duration/timing/severity/associated sxs/prior treatment) HPI  Kristy Wright is a 21 y.o. female Pt presents with complaints of sore throat for two days. She states that she is drinking fluids normal but has been eating less. Pt has not had fevers, chills, nasuea or vomiting. She is handling secretions well and does not appear to be in distress. Denies ear pain, cough. Pt states her son was being seen and she decided to get checked as well.  Past Medical History  Diagnosis Date  . HYPOTHYROIDISM 03/13/2009  . Thyroid activity decreased   . Thyroid activity decreased     History reviewed. No pertinent past surgical history.  Family History  Problem Relation Age of Onset  . Thyroid disease Sister     uncertain type  . Stroke Other     Grandmother-CVA  . Hypertension Other     Grandmother    History  Substance Use Topics  . Smoking status: Never Smoker   . Smokeless tobacco: Never Used  . Alcohol Use: No    OB History    Grav Para Term Preterm Abortions TAB SAB Ect Mult Living   5 4 4       4       Review of Systems  All other systems reviewed and are negative.    Allergies  Review of patient's allergies indicates no known allergies.  Home Medications   Current Outpatient Rx  Name Route Sig Dispense Refill  . LEVOTHYROXINE SODIUM 100 MCG PO TABS Oral Take 1 tablet (100 mcg total) by mouth daily. 30 tablet 2  . LIDOCAINE VISCOUS 2 % MT SOLN Oral Take 20 mLs by mouth as needed for pain (gargle as long as possible, then spit). 100 mL 0  . METRONIDAZOLE 500 MG PO TABS Oral Take 1 tablet (500 mg total) by mouth 2 (two) times daily. 14 tablet 0    BP 102/67  Pulse 78  Temp(Src) 98.1 F (36.7 C) (Oral)  Resp 16  SpO2  99%  LMP 12/18/2011  Physical Exam  Nursing note and vitals reviewed. Constitutional: She appears well-developed and well-nourished. No distress.  HENT:  Head: Normocephalic and atraumatic.  Right Ear: External ear normal.  Left Ear: External ear normal.  Nose: Nose normal.  Mouth/Throat: Oropharynx is clear and moist. No oropharyngeal exudate.  Eyes: Pupils are equal, round, and reactive to light.  Neck: Normal range of motion. Neck supple.  Cardiovascular: Normal rate and regular rhythm.   Pulmonary/Chest: Effort normal.  Abdominal: Soft.  Neurological: She is alert.  Skin: Skin is warm and dry.    ED Course  Procedures (including critical care time)   Labs Reviewed  RAPID STREP SCREEN   US Ob Comp Less 14 Wks  03/06/2012  *RADIOLOGY REPORT*  Clinical Data: Positive pregnancy test, vaginal bleeding, pain  OBSTETRIC <14 WK ULTRASOUND  Technique:  Transabdominal ultrasound was performed for evaluation of the gestation as well as the maternal uterus and adnexal regions.  Comparison:  None.  Intrauterine gestational sac: Visualized/normal in shape. Yolk sac: Not visualized Embryo: Visualized Cardiac Activity: Visualized Heart Rate: 169 bpm  Biparietal diameter:  2.4 cm, 14 weeks 1 day  Femur length:  1.3 cm, 13 weeks 5 days  Crown-rump length was not utilized due to fetal position and suspected inaccuracy of measurements.  [place] EDC: 13 weeks 6 days composite ultrasound age corresponds to an [facility] 09/02/2012 by ultrasound  Maternal uterus/Adnexae: Right ovary not visualized.  No adnexal mass.  Left ovary is normal.  No subchorionic hemorrhage or other acute finding.  No free fluid.  IMPRESSION: Single live intrauterine gestation with average ultrasound age by composite biometry 13 weeks 6 days.  This suggests inaccuracy of the LMP.  Original Report Authenticated By: Harrel Lemon, M.D.     1. Pharyngitis       MDM  pts exam benign, strep screen negative. Pt given Rx for lidocaine  solution to gargle and spit with. Pt can see her PCP if symptoms change or worsen. Most likely viral etiology.        Dorthula Matas, PA 03/09/12 [id number]

## 2012-03-07 NOTE — Discharge Instructions (Signed)
Salt Water Gargle This solution will help make your mouth and throat feel better. HOME CARE INSTRUCTIONS   Mix 1 teaspoon of salt in 8 ounces of warm water.   Gargle with this solution as much or often as you need or as directed. Swish and gargle gently if you have any sores or wounds in your mouth.   Do not swallow this mixture.  Document Released: 08/15/2004 Document Revised: 10/31/2011 Document Reviewed: 01/06/2009 [facility] Patient Information 2012 [place], [place].Pharyngitis, Viral and Bacterial Pharyngitis is soreness (inflammation) or infection of the pharynx. It is also called a sore throat. CAUSES  Most sore throats are caused by viruses and are part of a cold. However, some sore throats are caused by strep and other bacteria. Sore throats can also be caused by post nasal drip from draining sinuses, allergies and sometimes from sleeping with an open mouth. Infectious sore throats can be spread from person to person by coughing, sneezing and sharing cups or eating utensils. TREATMENT  Sore throats that are viral usually last 3-4 days. Viral illness will get better without medications (antibiotics). Strep throat and other bacterial infections will usually begin to get better about 24-48 hours after you begin to take antibiotics. HOME CARE INSTRUCTIONS   If the caregiver feels there is a bacterial infection or if there is a positive strep test, they will prescribe an antibiotic. The full course of antibiotics must be taken. If the full course of antibiotic is not taken, you or your child may become ill again. If you or your child has strep throat and do not finish all of the medication, serious heart or kidney diseases may develop.   Drink enough water and fluids to keep your urine clear or pale yellow.   Only take over-the-counter or prescription medicines for pain, discomfort or fever as directed by your caregiver.   Get lots of rest.   Gargle with salt water ( tsp. of salt in a  glass of water) as often as every 1-2 hours as you need for comfort.   Hard candies may soothe the throat if individual is not at risk for choking. Throat sprays or lozenges may also be used.  SEEK MEDICAL CARE IF:   Large, tender lumps in the neck develop.   A rash develops.   Green, yellow-brown or bloody sputum is coughed up.   Your baby is older than 3 months with a rectal temperature of 100.5 F (38.1 C) or higher for more than 1 day.  SEEK IMMEDIATE MEDICAL CARE IF:   A stiff neck develops.   You or your child are drooling or unable to swallow liquids.   You or your child are vomiting, unable to keep medications or liquids down.   You or your child has severe pain, unrelieved with recommended medications.   You or your child are having difficulty breathing (not due to stuffy nose).   You or your child are unable to fully open your mouth.   You or your child develop redness, swelling, or severe pain anywhere on the neck.   You have a fever.   Your baby is older than 3 months with a rectal temperature of 102 F (38.9 C) or higher.   Your baby is 74 months old or younger with a rectal temperature of 100.4 F (38 C) or higher.  MAKE SURE YOU:   Understand these instructions.   Will watch your condition.   Will get help right away if you are not doing well  or get worse.  Document Released: 11/11/2005 Document Revised: 10/31/2011 Document Reviewed: 02/08/2008 [facility] Patient Information 2012 [place], [place].Viral Pharyngitis Viral pharyngitis is a viral infection that produces redness, pain, and swelling (inflammation) of the throat. It can spread from person to person (contagious). CAUSES Viral pharyngitis is caused by inhaling a large amount of certain germs called viruses. Many different viruses cause viral pharyngitis. SYMPTOMS Symptoms of viral pharyngitis include:  Sore throat.   Tiredness.   Stuffy nose.   Low-grade fever.   Congestion.   Cough.    TREATMENT Treatment includes rest, drinking plenty of fluids, and the use of over-the-counter medication (approved by your caregiver). HOME CARE INSTRUCTIONS   Drink enough fluids to keep your urine clear or pale yellow.   Eat soft, cold foods such as ice cream, frozen ice pops, or gelatin dessert.   Gargle with warm salt water (1 tsp salt per 1 qt of water).   If over age 45, throat lozenges may be used safely.   Only take over-the-counter or prescription medicines for pain, discomfort, or fever as directed by your caregiver. Do not take aspirin.  To help prevent spreading viral pharyngitis to others, avoid:  Mouth-to-mouth contact with others.   Sharing utensils for eating and drinking.   Coughing around others.  SEEK MEDICAL CARE IF:   You are better in a few days, then become worse.   You have a fever or pain not helped by pain medicines.   There are any other changes that concern you.  Document Released: 08/21/2005 Document Revised: 10/31/2011 Document Reviewed: 01/17/2011 [facility] Patient Information 2012 [place], [place].

## 2012-03-08 NOTE — MAU Provider Note (Signed)
Medical Screening exam and patient care preformed by advanced practice provider.  Agree with the above management.  

## 2012-03-09 NOTE — ED Provider Notes (Signed)
Medical screening examination/treatment/procedure(s) were performed by non-physician practitioner and as supervising physician I was immediately available for consultation/collaboration.  Juliet Rude. Rubin Payor, MD 03/09/12 1500

## 2012-03-13 LAB — OB RESULTS CONSOLE GC/CHLAMYDIA: Chlamydia: NEGATIVE

## 2012-03-13 LAB — OB RESULTS CONSOLE HEPATITIS B SURFACE ANTIGEN: Hepatitis B Surface Ag: NEGATIVE

## 2012-03-13 LAB — OB RESULTS CONSOLE ABO/RH

## 2012-03-13 LAB — OB RESULTS CONSOLE RUBELLA ANTIBODY, IGM: Rubella: IMMUNE

## 2012-03-13 LAB — OB RESULTS CONSOLE ANTIBODY SCREEN: Antibody Screen: NEGATIVE

## 2012-05-11 ENCOUNTER — Emergency Department (HOSPITAL_COMMUNITY)
Admission: EM | Admit: 2012-05-11 | Discharge: 2012-05-11 | Disposition: A | Discharge status: Home / Self Care | Payer: Medicaid Other | Attending: Emergency Medicine | Admitting: Emergency Medicine

## 2012-05-11 ENCOUNTER — Encounter (HOSPITAL_COMMUNITY): Payer: Self-pay | Admitting: *Deleted

## 2012-05-11 ENCOUNTER — Emergency Department (HOSPITAL_COMMUNITY): Payer: Medicaid Other

## 2012-05-11 DIAGNOSIS — IMO0002 Reserved for concepts with insufficient information to code with codable children: Secondary | ICD-10-CM | POA: Insufficient documentation

## 2012-05-11 DIAGNOSIS — S9030XA Contusion of unspecified foot, initial encounter: Secondary | ICD-10-CM | POA: Insufficient documentation

## 2012-05-11 DIAGNOSIS — Z8249 Family history of ischemic heart disease and other diseases of the circulatory system: Secondary | ICD-10-CM | POA: Insufficient documentation

## 2012-05-11 DIAGNOSIS — S9032XA Contusion of left foot, initial encounter: Secondary | ICD-10-CM

## 2012-05-11 DIAGNOSIS — Z8349 Family history of other endocrine, nutritional and metabolic diseases: Secondary | ICD-10-CM | POA: Insufficient documentation

## 2012-05-11 DIAGNOSIS — Z331 Pregnant state, incidental: Secondary | ICD-10-CM | POA: Insufficient documentation

## 2012-05-11 DIAGNOSIS — E039 Hypothyroidism, unspecified: Secondary | ICD-10-CM | POA: Insufficient documentation

## 2012-05-11 DIAGNOSIS — Z823 Family history of stroke: Secondary | ICD-10-CM | POA: Insufficient documentation

## 2012-05-11 NOTE — ED Provider Notes (Signed)
History     CSN: [id number]  Arrival date & time 05/11/12  Kristy Wright   First MD Initiated Contact with Patient 05/11/12 1957      Chief Complaint  Patient presents with  . Foot Injury    (Consider location/radiation/quality/duration/timing/severity/associated sxs/prior treatment) HPI Comments: Patient here with left foot pain after having dropped a glass jar on her foot - states pain with ambulation and palpation of the foot - slight numbness to left MTP joint at 3rd, 4th and 5th toes - mild swelling - she is currently pregnant.  Patient is a 21 y.o. female presenting with foot injury. The history is provided by the patient. No language interpreter was used.  Foot Injury  The incident occurred 3 to 5 hours ago. The incident occurred at home. The injury mechanism was a direct blow. The pain is present in the left foot. The quality of the pain is described as aching. The pain is at a severity of 6/10. The pain is moderate. The pain has been constant since onset. Associated symptoms include numbness and inability to bear weight. Pertinent negatives include no loss of motion, no muscle weakness, no loss of sensation and no tingling. She reports no foreign bodies present. The symptoms are aggravated by bearing weight. She has tried nothing for the symptoms. The treatment provided no relief.    Past Medical History  Diagnosis Date  . HYPOTHYROIDISM 03/13/2009  . Thyroid activity decreased   . Thyroid activity decreased     History reviewed. No pertinent past surgical history.  Family History  Problem Relation Age of Onset  . Thyroid disease Sister     uncertain type  . Stroke Other     Grandmother-CVA  . Hypertension Other     Grandmother    History  Substance Use Topics  . Smoking status: Never Smoker   . Smokeless tobacco: Never Used  . Alcohol Use: No    OB History    Grav Para Term Preterm Abortions TAB SAB Ect Mult Living   5 4 4       4       Review of Systems    Constitutional: Negative for fever and chills.  HENT: Negative for neck pain.   Eyes: Negative for pain.  Respiratory: Negative for chest tightness and shortness of breath.   Cardiovascular: Negative for chest pain.  Gastrointestinal: Negative for abdominal distention.  Genitourinary: Negative for dysuria.  Musculoskeletal: Positive for arthralgias. Negative for back pain.  Neurological: Positive for numbness. Negative for tingling and headaches.  All other systems reviewed and are negative.    Allergies  Review of patient's allergies indicates no known allergies.  Home Medications   Current Outpatient Rx  Name Route Sig Dispense Refill  . LEVOTHYROXINE SODIUM 100 MCG PO TABS Oral Take 100 mcg by mouth daily.    Marland Kitchen PRENATAL 1 PO Oral Take 1 tablet by mouth daily.      BP 115/66  Pulse 88  Temp 98.4 F (36.9 C) (Oral)  Resp 18  SpO2 98%  LMP 12/18/2011  Physical Exam  Nursing note and vitals reviewed. Constitutional: She is oriented to person, place, and time. She appears well-developed and well-nourished. No distress.  HENT:  Head: Normocephalic and atraumatic.  Right Ear: External ear normal.  Left Ear: External ear normal.  Nose: Nose normal.  Mouth/Throat: Oropharynx is clear and moist. No oropharyngeal exudate.  Eyes: Conjunctivae are normal. Pupils are equal, round, and reactive to light. No scleral icterus.  Neck:  Normal range of motion. Neck supple.  Cardiovascular: Normal rate, regular rhythm and normal heart sounds.  Exam reveals no gallop and no friction rub.   No murmur heard. Pulmonary/Chest: Effort normal and breath sounds normal. No respiratory distress. She has no wheezes. She has no rales. She exhibits no tenderness.  Abdominal: Soft. Bowel sounds are normal. She exhibits no distension. There is no tenderness.  Musculoskeletal:       Left foot: She exhibits decreased range of motion, tenderness and swelling. She exhibits normal capillary refill and no  deformity.       Feet:  Lymphadenopathy:    She has no cervical adenopathy.  Neurological: She is alert and oriented to person, place, and time. No cranial nerve deficit.  Skin: Skin is warm and dry. No rash noted. No erythema. No pallor.  Psychiatric: She has a normal mood and affect. Her behavior is normal. Judgment and thought content normal.    ED Course  Procedures (including critical care time)  Labs Reviewed - No data to display Dg Foot Complete Left  05/11/2012  *RADIOLOGY REPORT*  Clinical Data: Dropped object on foot.  Foot injury and pain.  LEFT FOOT - COMPLETE 3+ VIEW  Comparison: 02/24/2011  Findings: No evidence of acute fracture or dislocation.  Old healed fracture deformity of the fifth metatarsal shaft is noted.  No other significant bone abnormality identified.  IMPRESSION: No acute findings.  Original Report Authenticated By: Danae Orleans, M.D.     Left foot contusion   MDM  Patient here with left foot contusion - no sign of fracture - will place in post op shoe - ace wrap and RICE        Scarlette Calico C. Ewing, [place] 05/11/12 2007

## 2012-05-11 NOTE — ED Notes (Signed)
A large glass jar fell onto her lt foot today.  Painful and swollen.lt foot.  edc oct 10th.

## 2012-05-11 NOTE — ED Notes (Signed)
See my note is triaged this pt

## 2012-05-11 NOTE — Discharge Instructions (Signed)
Bone Bruise  A bone bruise is a small hidden fracture of the bone. It typically occurs with bones located close to the surface of the skin.  SYMPTOMS  The pain lasts longer than a normal bruise.   The bruised area is difficult to use.   There may be discoloration or swelling of the bruised area.   When a bone bruise is found with injury to the anterior cruciate ligament (in the knee) there is often an increased:   Amount of fluid in the knee   Time the fluid in the knee lasts.   Number of days until you are walking normally and regaining the motion you had before the injury.   Number of days with pain from the injury.  DIAGNOSIS  It can only be seen on X-rays known as MRIs. This stands for magnetic resonance imaging. A regular X-ray taken of a bone bruise would appear to be normal. A bone bruise is a common injury in the knee and the heel bone (calcaneus). The problems are similar to those produced by stress fractures, which are bone injuries caused by overuse. A bone bruise may also be a sign of other injuries. For example, bone bruises are commonly found where an anterior cruciate ligament (ACL) in the knee has been pulled away from the bone (ruptured). A ligament is a tough fibrous material that connects bones together to make our joints stable. Bruises of the bone last a lot longer than bruises of the muscle or tissues beneath the skin. Bone bruises can last from days to months and are often more severe and painful than other bruises. TREATMENT Because bone bruises are sudden injuries you cannot often prevent them, other than by being extremely careful. Some things you can do to improve the condition are:  Apply ice to the sore area for 15 to 20 minutes, 3 to 4 times per day while awake for the first 2 days. Put the ice in a plastic bag, and place a towel between the bag of ice and your skin.   Keep your bruised area raised (elevated) when possible to lessen swelling.   For activity:     Use crutches when necessary; do not put weight on the injured leg until you are no longer tender.   You may walk on your affected part as the pain allows, or as instructed.   Start weight bearing gradually on the bruised part.   Continue to use crutches or a cane until you can stand without causing pain, or as instructed.   If a plaster splint was applied, wear the splint until you are seen for a follow-up examination. Rest it on nothing harder than a pillow the first 24 hours. Do not put weight on it. Do not get it wet. You may take it off to take a shower or bath.   If an air splint was applied, more air may be blown into or out of the splint as needed for comfort. You may take it off at night and to take a shower or bath.   Wiggle your toes in the splint several times per day if you are able.   You may have been given an elastic bandage to use with the plaster splint or alone. The splint is too tight if you have numbness, tingling or if your foot becomes cold and blue. Adjust the bandage to make it comfortable.   Only take over-the-counter or prescription medicines for pain, discomfort, or fever as directed by   your caregiver.   Follow all instructions for follow up with your caregiver. This includes any orthopedic referrals, physical therapy, and rehabilitation. Any delay in obtaining necessary care could result in a delay or failure of the bones to heal.  SEEK MEDICAL CARE IF:   You have an increase in bruising, swelling, or pain.   You notice coldness of your toes.   You do not get pain relief with medications.  SEEK IMMEDIATE MEDICAL CARE IF:   Your toes are numb or blue.   You have severe pain not controlled with medications.   If any of the problems that caused you to seek care are becoming worse.  Document Released: 02/01/2004 Document Revised: 10/31/2011 Document Reviewed: 06/15/2008 Exit[employer] Patient Information 2012 [employer].  Contusion A contusion is a deep  bruise. Contusions are the result of an injury that caused bleeding under the skin. The contusion may turn blue, purple, or yellow. Minor injuries will give you a painless contusion, but more severe contusions may stay painful and swollen for a few weeks.  CAUSES  A contusion is usually caused by a blow, trauma, or direct force to an area of the body. SYMPTOMS   Swelling and redness of the injured area.   Bruising of the injured area.   Tenderness and soreness of the injured area.   Pain.  DIAGNOSIS  The diagnosis can be made by taking a history and physical exam. An X-ray, CT scan, or MRI may be needed to determine if there were any associated injuries, such as fractures. TREATMENT  Specific treatment will depend on what area of the body was injured. In general, the best treatment for a contusion is resting, icing, elevating, and applying cold compresses to the injured area. Over-the-counter medicines may also be recommended for pain control. Ask your caregiver what the best treatment is for your contusion. HOME CARE INSTRUCTIONS   Put ice on the injured area.   Put ice in a plastic bag.   Place a towel between your skin and the bag.   Leave the ice on for 15 to 20 minutes, 3 to 4 times a day.   Only take over-the-counter or prescription medicines for pain, discomfort, or fever as directed by your caregiver. Your caregiver may recommend avoiding anti-inflammatory medicines (aspirin, ibuprofen, and naproxen) for 48 hours because these medicines may increase bruising.   Rest the injured area.   If possible, elevate the injured area to reduce swelling.  SEEK IMMEDIATE MEDICAL CARE IF:   You have increased bruising or swelling.   You have pain that is getting worse.   Your swelling or pain is not relieved with medicines.  MAKE SURE YOU:   Understand these instructions.   Will watch your condition.   Will get help right away if you are not doing well or get worse.  Document  Released: 08/21/2005 Document Revised: 10/31/2011 Document Reviewed: 09/16/2011 [employer] Patient Information 2012 [employer]. 

## 2012-05-11 NOTE — Progress Notes (Signed)
Orthopedic Tech Progress Note Patient Details:  KEYNA BLIZARD 1991-04-11 [id number]  Ortho Devices Type of Ortho Device: Postop boot Ortho Device/Splint Location: left foot Ortho Device/Splint Interventions: Application   Yuna Pizzolato 05/11/2012, 8:21 PM

## 2012-05-11 NOTE — ED Notes (Signed)
fht 2' below the umbilicus rate 134 and bounding.  No problems with this pregnancy thus far.  This is her 6th preg all living and well

## 2012-05-12 NOTE — ED Provider Notes (Signed)
Medical screening examination/treatment/procedure(s) were performed by non-physician practitioner and as supervising physician I was immediately available for consultation/collaboration.  Etan Vasudevan L Travis Mastel, MD 05/12/12 1943 

## 2012-06-25 ENCOUNTER — Encounter (HOSPITAL_COMMUNITY): Payer: Self-pay | Admitting: *Deleted

## 2012-06-25 ENCOUNTER — Inpatient Hospital Stay (HOSPITAL_COMMUNITY)
Admission: AD | Admit: 2012-06-25 | Discharge: 2012-06-25 | Disposition: A | Discharge status: Home / Self Care | Payer: Medicaid Other | Source: Ambulatory Visit | Attending: Obstetrics and Gynecology | Admitting: Obstetrics and Gynecology

## 2012-06-25 DIAGNOSIS — O99891 Other specified diseases and conditions complicating pregnancy: Secondary | ICD-10-CM | POA: Insufficient documentation

## 2012-06-25 DIAGNOSIS — O26899 Other specified pregnancy related conditions, unspecified trimester: Secondary | ICD-10-CM

## 2012-06-25 DIAGNOSIS — R109 Unspecified abdominal pain: Secondary | ICD-10-CM | POA: Insufficient documentation

## 2012-06-25 LAB — POCT FERN TEST: Fern Test: NEGATIVE

## 2012-06-25 LAB — AMNISURE RUPTURE OF MEMBRANE (ROM) NOT AT ARMC: Amnisure ROM: NEGATIVE

## 2012-06-25 LAB — URINALYSIS, ROUTINE W REFLEX MICROSCOPIC
Bilirubin Urine: NEGATIVE
Ketones, ur: 15 mg/dL — AB
Leukocytes, UA: NEGATIVE
Nitrite: NEGATIVE
Urobilinogen, UA: 0.2 mg/dL (ref 0.0–1.0)
pH: 6 (ref 5.0–8.0)

## 2012-06-25 NOTE — MAU Provider Note (Signed)
History     CSN: [id number]  Arrival date and time: 06/25/12 1657   First Provider Initiated Contact with Patient 06/25/12 1754      Chief Complaint  Patient presents with  . Possible rupture of membranes    HPI Kristy Wright is a 21 y.o. female @ [redacted]w[redacted]d gestation who presents to MAU for leaking of fluids and contractions. The symptoms started earlier today. The history was provided by the patient.   OB History    Grav Para Term Preterm Abortions TAB SAB Ect Mult Living   5 4 4       4       Past Medical History  Diagnosis Date  . HYPOTHYROIDISM 03/13/2009  . Thyroid activity decreased   . Thyroid activity decreased     History reviewed. No pertinent past surgical history.  Family History  Problem Relation Age of Onset  . Thyroid disease Sister     uncertain type  . Stroke Other     Grandmother-CVA  . Hypertension Other     Grandmother    History  Substance Use Topics  . Smoking status: Never Smoker   . Smokeless tobacco: Never Used  . Alcohol Use: No    Allergies: No Known Allergies  Prescriptions prior to admission  Medication Sig Dispense Refill  . acetaminophen (TYLENOL) 325 MG tablet Take 650 mg by mouth every 6 (six) hours as needed. For headaches      . levothyroxine (SYNTHROID, LEVOTHROID) 100 MCG tablet Take 100 mcg by mouth daily.      . Prenatal Vit-Fe Fumarate-FA (PRENATAL MULTIVITAMIN) TABS Take 1 tablet by mouth daily.        ROS Physical Exam   Blood pressure 114/64, pulse 100, temperature 98.8 F (37.1 C), temperature source Oral, resp. rate 16, height 5\' 5"  (1.651 m), weight 186 lb 6 oz (84.539 kg), last menstrual period 12/18/2011, SpO2 100.00%.  Physical Exam  Constitutional: She is oriented to person, place, and time. She appears well-developed and well-nourished. No distress.  HENT:  Head: Normocephalic and atraumatic.  Eyes: EOM are normal.  Neck: Neck supple.  Cardiovascular: Normal rate.   Respiratory: Effort normal.  GI:  Soft.       Minimal tenderness with palpation bilateral abdomen.  Genitourinary: Vagina normal.  Musculoskeletal: Normal range of motion.  Neurological: She is alert and oriented to person, place, and time.  Skin: Skin is warm and dry.  Psychiatric: She has a normal mood and affect. Her behavior is normal. Judgment and thought content normal.    EFM: Baseline 140, moderate variability, no contractions, tracing reassuring for gestational age. Cervical exam  Dilation:  (Outer os 1 cm, inner os closed) Effacement (%): Thick Cervical Position: Posterior Station: -2 Exam by:: Dorrene German Rn  Results for orders placed during the hospital encounter of 06/25/12 (from the past 24 hour(s))  URINALYSIS, ROUTINE W REFLEX MICROSCOPIC     Status: Abnormal   Collection Time   06/25/12  5:10 PM      Component Value Range   Color, Urine YELLOW  YELLOW   APPearance CLEAR  CLEAR   Specific Gravity, Urine >1.030 (*) 1.005 - 1.030   pH 6.0  5.0 - 8.0   Glucose, UA NEGATIVE  NEGATIVE mg/dL   Hgb urine dipstick NEGATIVE  NEGATIVE   Bilirubin Urine NEGATIVE  NEGATIVE   Ketones, ur 15 (*) NEGATIVE mg/dL   Protein, ur NEGATIVE  NEGATIVE mg/dL   Urobilinogen, UA 0.2  0.0 - 1.0  mg/dL   Nitrite NEGATIVE  NEGATIVE   Leukocytes, UA NEGATIVE  NEGATIVE  AMNISURE RUPTURE OF MEMBRANE (ROM)     Status: Normal   Collection Time   06/25/12  6:00 PM      Component Value Range   Amnisure ROM NEGATIVE    POCT FERN TEST     Status: Normal   Collection Time   06/25/12  6:15 PM      Component Value Range   Fern Test Negative     MAU Course: Consult with Dr. Jackelyn Knife. Will d/c home to follow up in the office.  Procedures  Follow-up Information    Schedule an appointment as soon as possible for a visit with Zenaida Niece, MD.   Contact information:   [address] [place], [address] [place] [address] [phone]        Assessment: Abdominal pain in pregnency  Plan:  Tylenol, rest, increase po  fluids, follow up in the office.   Return here as needed.   NEESE,HOPE, RN, FNP, [facility] 06/25/2012, 6:09 PM

## 2012-06-25 NOTE — Progress Notes (Signed)
FHT from today reviewed.  Reactive NST, occ ctx. 

## 2012-06-25 NOTE — MAU Note (Signed)
Pt states beginning last pm, had ctx's and noted small gush of fluid around 1600-1700, notes small gushes of clear non odorous fluid with fetal movement.

## 2012-08-17 ENCOUNTER — Telehealth (HOSPITAL_COMMUNITY): Payer: Self-pay | Admitting: *Deleted

## 2012-08-17 ENCOUNTER — Encounter (HOSPITAL_COMMUNITY): Payer: Self-pay | Admitting: *Deleted

## 2012-08-17 NOTE — Telephone Encounter (Signed)
Preadmission screen  

## 2012-08-18 ENCOUNTER — Telehealth (HOSPITAL_COMMUNITY): Payer: Self-pay | Admitting: *Deleted

## 2012-08-18 NOTE — Telephone Encounter (Signed)
Preadmission screen  

## 2012-08-19 ENCOUNTER — Emergency Department (HOSPITAL_COMMUNITY)
Admission: EM | Admit: 2012-08-19 | Discharge: 2012-08-20 | Disposition: A | Discharge status: Home / Self Care | Payer: Medicaid Other | Attending: Emergency Medicine | Admitting: Emergency Medicine

## 2012-08-19 ENCOUNTER — Encounter (HOSPITAL_COMMUNITY): Payer: Self-pay | Admitting: Adult Health

## 2012-08-19 DIAGNOSIS — Z803 Family history of malignant neoplasm of breast: Secondary | ICD-10-CM | POA: Insufficient documentation

## 2012-08-19 DIAGNOSIS — G5600 Carpal tunnel syndrome, unspecified upper limb: Secondary | ICD-10-CM | POA: Insufficient documentation

## 2012-08-19 DIAGNOSIS — Z8489 Family history of other specified conditions: Secondary | ICD-10-CM | POA: Insufficient documentation

## 2012-08-19 DIAGNOSIS — Z8249 Family history of ischemic heart disease and other diseases of the circulatory system: Secondary | ICD-10-CM | POA: Insufficient documentation

## 2012-08-19 DIAGNOSIS — Z823 Family history of stroke: Secondary | ICD-10-CM | POA: Insufficient documentation

## 2012-08-19 DIAGNOSIS — Z8049 Family history of malignant neoplasm of other genital organs: Secondary | ICD-10-CM | POA: Insufficient documentation

## 2012-08-19 DIAGNOSIS — Z833 Family history of diabetes mellitus: Secondary | ICD-10-CM | POA: Insufficient documentation

## 2012-08-19 DIAGNOSIS — E039 Hypothyroidism, unspecified: Secondary | ICD-10-CM | POA: Insufficient documentation

## 2012-08-19 MED ORDER — ACETAMINOPHEN 500 MG PO TABS: 500.0000 mg | ORAL_TABLET | Freq: Four times a day (QID) | ORAL | Status: DC | PRN

## 2012-08-19 NOTE — ED Provider Notes (Signed)
History     CSN: [id number]  Arrival date & time 08/19/12  1308   First MD Initiated Contact with Patient 08/19/12 2248      Chief Complaint  Patient presents with  . Numbness   HPI  History provided by the patient. Patient is a 21 year old female who is a G3 P2 currently 8 months pregnant who presents with complaints of right wrist and hand pain with occasional numbness. Patient states symptoms have been increasing in frequency and lasting longer during the day. Pain mostly affect first through third digits but she still some pain to sleep through the hand. She also reports occasional tingling and numbness sensation in her hand as if it was asleep. Patient does report having occasional symptoms during her previous pregnancies but it was never this severe and she never paid much attention to it. Patient has not used any treatment for her symptoms currently. She denies any other aggravating or alleviating factors. She denies any upper arm pain and swelling. Denies any neck pain or stiffness.    Past Medical History  Diagnosis Date  . HYPOTHYROIDISM 03/13/2009  . Thyroid activity decreased   . Thyroid activity decreased   . Pyelonephritis     History reviewed. No pertinent past surgical history.  Family History  Problem Relation Age of Onset  . Thyroid disease Sister     uncertain type  . Hypertension Sister   . Stroke Other     Grandmother-CVA  . Cancer Mother     cervical  . Diabetes Maternal Grandmother   . Heart disease Maternal Grandmother   . Hypertension Maternal Grandmother   . Cancer Paternal Grandmother     breast  . Hypertension Paternal Grandmother   . Diabetes Paternal Grandfather     History  Substance Use Topics  . Smoking status: Never Smoker   . Smokeless tobacco: Never Used  . Alcohol Use: No    OB History    Grav Para Term Preterm Abortions TAB SAB Ect Mult Living   5 4 4       4       Review of Systems  Constitutional: Negative for fever.    Gastrointestinal: Negative for abdominal pain.  Genitourinary: Negative for vaginal bleeding and vaginal discharge.  Musculoskeletal:       Right wrist and hand pain with occasional numbness  Neurological: Positive for numbness. Negative for weakness.    Allergies  Review of patient's allergies indicates no known allergies.  Home Medications   Current Outpatient Rx  Name Route Sig Dispense Refill  . ACETAMINOPHEN 325 MG PO TABS Oral Take 650 mg by mouth every 6 (six) hours as needed. For headaches    . LEVOTHYROXINE SODIUM 100 MCG PO TABS Oral Take 100 mcg by mouth daily.    Marland Kitchen PRENATAL MULTIVITAMIN CH Oral Take 1 tablet by mouth daily.      BP 123/73  Pulse 86  Temp 97.9 F (36.6 C) (Oral)  Resp 16  SpO2 98%  LMP 12/18/2011  Physical Exam  Nursing note and vitals reviewed. Constitutional: She is oriented to person, place, and time. She appears well-developed and well-nourished. No distress.  HENT:  Head: Normocephalic.  Cardiovascular: Normal rate and regular rhythm.   Pulmonary/Chest: Effort normal and breath sounds normal. No respiratory distress. She has no wheezes.  Abdominal:       Gravid  Musculoskeletal: Normal range of motion. She exhibits tenderness. She exhibits no edema.       Positive Tinel sign.  Negative Phalen's test. No deformities of wrist or hand. Normal grip strength. Normal sensations. Normal cap refill.  Neurological: She is alert and oriented to person, place, and time.  Skin: Skin is warm and dry. No rash noted. No erythema.  Psychiatric: She has a normal mood and affect. Her behavior is normal.    ED Course  Procedures    1. Carpal tunnel syndrome       MDM  Patient seen and evaluated. Patient in no acute distress. Patient with clinical findings and symptoms consistent with carpal tunnel syndrome        Angus Seller, PA 08/19/12 2336

## 2012-08-19 NOTE — ED Notes (Addendum)
C/o right hand numbness that lasts all day started one month ago just at night and has progressed to all day. . CMS intact. 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

## 2012-08-19 NOTE — Progress Notes (Signed)
Orthopedic Tech Progress Note Patient Details:  Kristy Wright 10/21/1991 [id number]  Ortho Devices Type of Ortho Device: Velcro wrist splint   Haskell Flirt 08/19/2012, 11:35 PM

## 2012-08-19 NOTE — ED Notes (Signed)
Pt alert, NAD, calm, interactive, ambulatory, CMS intact, ROM decreased d/t "stiffness/soreness", denies pain at this time, mild numbness. radial, ulnar and brachial pulses palpable, cap refill < 2sec, ortho tech to bring wrist splint.

## 2012-08-20 NOTE — ED Provider Notes (Signed)
Medical screening examination/treatment/procedure(s) were performed by non-physician practitioner and as supervising physician I was immediately available for consultation/collaboration.   Gwyneth Sprout, MD 08/20/12 0040

## 2012-08-31 ENCOUNTER — Inpatient Hospital Stay (HOSPITAL_COMMUNITY): Admission: RE | Admit: 2012-08-31 | Payer: Medicaid Other | Source: Ambulatory Visit

## 2012-09-02 ENCOUNTER — Inpatient Hospital Stay (HOSPITAL_COMMUNITY): Admission: RE | Admit: 2012-09-02 | Payer: Medicaid Other | Source: Ambulatory Visit

## 2012-09-03 ENCOUNTER — Other Ambulatory Visit: Payer: Self-pay | Admitting: Obstetrics and Gynecology

## 2012-09-04 ENCOUNTER — Inpatient Hospital Stay (HOSPITAL_COMMUNITY)
Admission: AD | Admit: 2012-09-04 | Discharge: 2012-09-06 | DRG: 775 | Disposition: A | Discharge status: Home / Self Care | Payer: Medicaid Other | Source: Ambulatory Visit | Attending: Obstetrics and Gynecology | Admitting: Obstetrics and Gynecology

## 2012-09-04 ENCOUNTER — Encounter (HOSPITAL_COMMUNITY): Payer: Self-pay | Admitting: *Deleted

## 2012-09-04 DIAGNOSIS — E039 Hypothyroidism, unspecified: Secondary | ICD-10-CM

## 2012-09-04 DIAGNOSIS — E079 Disorder of thyroid, unspecified: Principal | ICD-10-CM | POA: Diagnosis present

## 2012-09-04 LAB — CBC
HCT: 30 % — ABNORMAL LOW (ref 36.0–46.0)
Hemoglobin: 9.8 g/dL — ABNORMAL LOW (ref 12.0–15.0)
MCH: 27.6 pg (ref 26.0–34.0)
MCHC: 32.7 g/dL (ref 30.0–36.0)
RBC: 3.55 MIL/uL — ABNORMAL LOW (ref 3.87–5.11)

## 2012-09-04 LAB — RPR: RPR Ser Ql: NONREACTIVE

## 2012-09-04 MED ORDER — LIDOCAINE HCL (PF) 1 % IJ SOLN
30.0000 mL | INTRAMUSCULAR | Status: DC | PRN
  Filled 2012-09-04: qty 30

## 2012-09-04 MED ORDER — ONDANSETRON HCL 4 MG/2ML IJ SOLN: 4.0000 mg | INTRAMUSCULAR | Status: DC | PRN

## 2012-09-04 MED ORDER — LACTATED RINGERS IV SOLN
INTRAVENOUS | Status: DC
  Administered 2012-09-04 (×2): via INTRAVENOUS

## 2012-09-04 MED ORDER — OXYTOCIN 40 UNITS IN LACTATED RINGERS INFUSION - SIMPLE MED: 62.5000 mL/h | INTRAVENOUS | Status: AC | PRN

## 2012-09-04 MED ORDER — PRENATAL MULTIVITAMIN CH: 1.0000 | ORAL_TABLET | Freq: Every day | ORAL | Status: DC

## 2012-09-04 MED ORDER — OXYTOCIN BOLUS FROM INFUSION
500.0000 mL | Freq: Once | INTRAVENOUS | Status: DC
  Filled 2012-09-04: qty 500

## 2012-09-04 MED ORDER — OXYTOCIN 40 UNITS IN LACTATED RINGERS INFUSION - SIMPLE MED
62.5000 mL/h | Freq: Once | INTRAVENOUS | Status: AC
  Administered 2012-09-04: 999 mL/h via INTRAVENOUS

## 2012-09-04 MED ORDER — OXYCODONE-ACETAMINOPHEN 5-325 MG PO TABS: 1.0000 | ORAL_TABLET | ORAL | Status: DC | PRN

## 2012-09-04 MED ORDER — TETANUS-DIPHTH-ACELL PERTUSSIS 5-2.5-18.5 LF-MCG/0.5 IM SUSP: 0.5000 mL | Freq: Once | INTRAMUSCULAR | Status: DC

## 2012-09-04 MED ORDER — CALCIUM CARBONATE ANTACID 500 MG PO CHEW
1.0000 | CHEWABLE_TABLET | Freq: Three times a day (TID) | ORAL | Status: DC
  Filled 2012-09-04: qty 1

## 2012-09-04 MED ORDER — WITCH HAZEL-GLYCERIN EX PADS: 1.0000 "application " | MEDICATED_PAD | CUTANEOUS | Status: DC | PRN

## 2012-09-04 MED ORDER — OXYCODONE-ACETAMINOPHEN 5-325 MG PO TABS
1.0000 | ORAL_TABLET | ORAL | Status: DC | PRN
Start: 2012-09-04 — End: 2012-09-06
  Administered 2012-09-04 – 2012-09-06 (×6): 1 via ORAL
  Filled 2012-09-04: qty 1
  Filled 2012-09-04 (×3): qty 2
  Filled 2012-09-04: qty 1

## 2012-09-04 MED ORDER — IBUPROFEN 600 MG PO TABS
600.0000 mg | ORAL_TABLET | Freq: Four times a day (QID) | ORAL | Status: DC | PRN
  Administered 2012-09-04: 600 mg via ORAL
  Filled 2012-09-04: qty 1

## 2012-09-04 MED ORDER — SENNOSIDES-DOCUSATE SODIUM 8.6-50 MG PO TABS
2.0000 | ORAL_TABLET | Freq: Every day | ORAL | Status: DC
  Administered 2012-09-04: 2 via ORAL

## 2012-09-04 MED ORDER — MEASLES, MUMPS & RUBELLA VAC ~~LOC~~ INJ: 0.5000 mL | INJECTION | Freq: Once | SUBCUTANEOUS | Status: DC

## 2012-09-04 MED ORDER — DIPHENHYDRAMINE HCL 25 MG PO CAPS: 25.0000 mg | ORAL_CAPSULE | Freq: Four times a day (QID) | ORAL | Status: DC | PRN

## 2012-09-04 MED ORDER — LACTATED RINGERS IV SOLN: INTRAVENOUS | Status: AC

## 2012-09-04 MED ORDER — ONDANSETRON HCL 4 MG PO TABS: 4.0000 mg | ORAL_TABLET | ORAL | Status: DC | PRN

## 2012-09-04 MED ORDER — IBUPROFEN 600 MG PO TABS
600.0000 mg | ORAL_TABLET | Freq: Four times a day (QID) | ORAL | Status: DC
  Administered 2012-09-05 – 2012-09-06 (×6): 600 mg via ORAL
  Filled 2012-09-04 (×7): qty 1

## 2012-09-04 MED ORDER — BENZOCAINE-MENTHOL 20-0.5 % EX AERO: 1.0000 "application " | INHALATION_SPRAY | CUTANEOUS | Status: DC | PRN

## 2012-09-04 MED ORDER — LANOLIN HYDROUS EX OINT: TOPICAL_OINTMENT | CUTANEOUS | Status: DC | PRN

## 2012-09-04 MED ORDER — ZOLPIDEM TARTRATE 5 MG PO TABS: 5.0000 mg | ORAL_TABLET | Freq: Every evening | ORAL | Status: DC | PRN

## 2012-09-04 MED ORDER — PRENATAL MULTIVITAMIN CH
1.0000 | ORAL_TABLET | Freq: Every day | ORAL | Status: DC
  Administered 2012-09-05 – 2012-09-06 (×2): 1 via ORAL
  Filled 2012-09-04 (×2): qty 1

## 2012-09-04 MED ORDER — OXYTOCIN 40 UNITS IN LACTATED RINGERS INFUSION - SIMPLE MED
1.0000 m[IU]/min | INTRAVENOUS | Status: DC
  Administered 2012-09-04: 1 m[IU]/min via INTRAVENOUS
  Filled 2012-09-04: qty 1000

## 2012-09-04 MED ORDER — LEVOTHYROXINE SODIUM 100 MCG PO TABS
100.0000 ug | ORAL_TABLET | Freq: Every day | ORAL | Status: DC
  Administered 2012-09-05 – 2012-09-06 (×2): 100 ug via ORAL
  Filled 2012-09-04 (×3): qty 1

## 2012-09-04 MED ORDER — DIBUCAINE 1 % RE OINT: 1.0000 "application " | TOPICAL_OINTMENT | RECTAL | Status: DC | PRN

## 2012-09-04 MED ORDER — SIMETHICONE 80 MG PO CHEW
80.0000 mg | CHEWABLE_TABLET | ORAL | Status: DC | PRN
  Administered 2012-09-05: 80 mg via ORAL

## 2012-09-04 MED ORDER — LACTATED RINGERS IV SOLN
500.0000 mL | INTRAVENOUS | Status: DC | PRN
  Administered 2012-09-04: 1000 mL via INTRAVENOUS

## 2012-09-04 NOTE — Progress Notes (Signed)
Patient ID: Kristy Wright, female   DOB: May 08, 1991, 21 y.o.   MRN: [id number] Pitocin at 15 mu/ minute and the contractions are q 2-3 minutes. The cervix is 4 cm 70% effaced and the vertex is at - 2 station. The FHR is normal.

## 2012-09-04 NOTE — Progress Notes (Addendum)
Patient ID: Kristy Wright, female   DOB: 1991/06/28, 21 y.o.   MRN: [id number] Delivery note:  The pt progressed rapidly to full dilatation and with one push del;ivered the vertex but then she quit pushing so the posterior arm was delivered and the remainder of the baby quickly followed. The infant was female and Apgars were 9 and 9 at 1 and 5 minutes. The placenta delivered intact and the uterus was normal. There were no lacerations. EBL 400 cc 's.

## 2012-09-04 NOTE — H&P (Signed)
Kristy Wright, Kristy Wright               ACCOUNT NO.:  [id number]  MEDICAL RECORD NO.:  [id number]  LOCATION:                                 FACILITY:  PHYSICIAN:  Malachi Pro. Ambrose Mantle, M.D. DATE OF BIRTH:  1990/11/26  DATE OF ADMISSION:  09/04/2012 DATE OF DISCHARGE:                             HISTORY & PHYSICAL   HISTORY OF PRESENT ILLNESS:  This is a 21 year old Hispanic female, para 4-0-0-4 gravida 5 with last menstrual period November 28, 2011 [facility] September 03, 2012 with an ultrasound on March 06, 2012 at 13-6/7th weeks confirming her dates.  She is admitted for induction.  Her blood group and type is A positive.  Negative antibody.  Pap smear normal.  Rubella immune.  RPR nonreactive.  Urine culture negative.  Hepatitis B surface antigen negative.  HIV negative.  GC and Chlamydia negative.  TSH 2.82 quad screen was negative.  One hour Glucola 87, group B strep negative. The patient's prenatal course began at 15 weeks and 1 day.  She has had no significant complications.  Her ultrasound at 18 weeks was normal. She received her Tdap vaccine.  She felt, she was leaking fluid at 29 weeks, but this became obvious that she was not leaking fluid.  She has been 2 cm dilated for at least a week and is now admitted for induction of labor.  PAST MEDICAL HISTORY:  Reveals no food allergies, no drug allergies.  No latex allergies.  She has a history of hypothyroidism.  She has had allergic rhinitis fractured foot.  She had pyelonephritis at 14 weeks in 2009.  She has had no surgical procedure.  MEDICATIONS:  Include Synthroid 100 mcg a day and prenatal vitamins.  FAMILY HISTORY:  Sister with thyroid disease and high blood pressure. Maternal grandmother with diabetes, heart disease, high blood pressure. Her mother had cervical cancer.  Maternal grandmother with breast cancer and high blood pressure.  OBSTETRIC AND GYNECOLOGIC HISTORY:  The patient has had four vaginal deliveries in 2006, 2007,  2009, and 2011.  The weights varied from 7.3 to 7.9.  SOCIAL HISTORY:  The patient is active, but does not have a formal exercise program.  She denies tobacco, alcohol, and illicit substance abuse.  She has completed the 10th grade.  She works at [employer].  She is in a relationship for 6 years with the father of the baby and children.  PHYSICAL EXAMINATION:  GENERAL:  A well-developed somewhat obese white Hispanic female, in no distress. HEART:  Normal size and sounds.  No murmurs. LUNGS:  Clear to auscultation. PELVIC:  Fundal height 39.5 cm.  Fetal heart tones normal at her visit on August 28, 2012, weight 197 pounds.  Cervix 2 cm, 50%, vertex at a -2 station.  ADMITTING IMPRESSION:  Intrauterine pregnancy at 39 weeks and 6 days. The patient is admitted for induction of labor.  She also has hypothyroidism.     Malachi Pro. Ambrose Mantle, M.D.     TFH/MEDQ  D:  09/03/2012  T:  09/03/2012  Job:  [id number]

## 2012-09-04 NOTE — Progress Notes (Signed)
Pt feeling nauseous.  Declines medication for N/V.  IV fluid bolus started.

## 2012-09-04 NOTE — Progress Notes (Signed)
Patient ID: Kristy Wright, female   DOB: 1991/01/12, 21 y.o.   MRN: [id number] Cervix 3 cm 40% effaced , slightly posterior and vertex at -3 station.

## 2012-09-04 NOTE — Progress Notes (Signed)
Patient ID: Kristy Wright, female   DOB: 1991/08/26, 21 y.o.   MRN: [id number] Pt is on 13 mu/minute of pitocin and the contractions are q 2-3 minutes. The cervix is 3-4 cm 50% effaced and the vertex is at - 3 station. AROM produced a significant amount of clear fluid.

## 2012-09-04 NOTE — Progress Notes (Signed)
Pt reports SROM.  No fluid seen on perineum even after coughing. No fluid noted with SVE.   Pad placed underneath pt to continue to assess.

## 2012-09-05 LAB — CBC
HCT: 27.6 % — ABNORMAL LOW (ref 36.0–46.0)
Hemoglobin: 8.9 g/dL — ABNORMAL LOW (ref 12.0–15.0)
MCV: 86.8 fL (ref 78.0–100.0)
RDW: 14.4 % (ref 11.5–15.5)

## 2012-09-05 NOTE — Progress Notes (Signed)
Patient ID: Kristy Wright, female   DOB: [birth year], 21 y.o.   MRN: [id number] #1 afebrile BP normal No complaints. HGB 9.8 to 8.9

## 2012-09-06 MED ORDER — LEVOTHYROXINE SODIUM 100 MCG PO TABS: 100.0000 ug | ORAL_TABLET | Freq: Every day | ORAL | Status: DC

## 2012-09-06 MED ORDER — OXYCODONE-ACETAMINOPHEN 5-325 MG PO TABS: 1.0000 | ORAL_TABLET | Freq: Four times a day (QID) | ORAL | Status: DC | PRN

## 2012-09-06 MED ORDER — IBUPROFEN 600 MG PO TABS: 600.0000 mg | ORAL_TABLET | Freq: Four times a day (QID) | ORAL | Status: DC | PRN

## 2012-09-06 NOTE — Progress Notes (Signed)
Patient ID: Kristy Wright, female   DOB: 04-18-1991, 21 y.o.   MRN: [id number] #2 afebrile BP normal for d/c

## 2012-09-06 NOTE — Discharge Summary (Signed)
Kristy Wright, Kristy Wright               ACCOUNT NO.:  [id number]  MEDICAL RECORD NO.:  [id number]  LOCATION:  9131                          FACILITY:  [facility]  PHYSICIAN:  Malachi Pro. Ambrose Mantle, M.D. DATE OF BIRTH:  [birth year]  DATE OF ADMISSION:  09/04/2012 DATE OF DISCHARGE:  09/06/2012                              DISCHARGE SUMMARY   A 21 year old, Hispanic female, para 4-0-0-4, gravida 5, [facility] September 03, 2012, by an ultrasound on March 06, 2012 at 46 and 6/7th weeks gestation, admitted for induction of labor.  Blood group and type A positive, negative antibody.  Pap smear normal.  Rubella immune.  RPR nonreactive.  Urine culture negative.  Hepatitis B surface antigen negative, HIV negative, GC and Chlamydia negative.  TSH 2.82.  Quad screen negative.  One hour Glucola 87, group B strep negative.  The patient's prenatal course is documented in her history and physical. After admission to the hospital, she was placed on Pitocin.  By 12:57 pm, the Pitocin was at 13 milliunits a minute.  Contractions were every 2-3 minutes.  Cervix was 3-4 cm, 50%, vertex at -3.  Artificial rupture of the membranes produced a significant amount of clear fluid.  By 1:47 pm, the Pitocin was at 15 milliunits a minute.  Contractions every 2-3 minutes.  Cervix 4 cm, 70%, vertex at -2.  Fetal heart tones were normal.  The patient progressed rapidly to full dilatation with 1 push, delivered the vertex, but then she quit pushing so the posterior arm was delivered and the remainder of the baby quickly followed.  The infant was female, 8 pounds 12 ounces.  Apgars were 9 and 9 at 1 and 5 minutes. Placenta delivered intact.  Uterus was normal.  There were no lacerations.  Blood loss about 400 mL.  Postpartum, the patient did well and was discharged on the second postpartum day.  Initial hemoglobin was 9.8, hematocrit 30, white count 5400, platelet count 141,000, RPR was nonreactive.  Postpartum hemoglobin 8.9, hematocrit 27.6,  white count 7100, platelet count 138,000.  FINAL DIAGNOSES:  Intrauterine pregnancy at term, delivered vertex, hypothyroidism, anemia.  OPERATIONS:  Spontaneous delivery vertex.  FINAL CONDITION:  Improved.  Instructions include our regular discharge instruction booklet as well as the after visit summary, levothyroxine 100 mcg daily, 30 tablets, 3 refills 1 a day; Percocet 5/325, 30 tablets, 1 every 6 hours as needed for pain; Motrin 600 mg, 30 tablets, 1 every 6 hours as needed for pain. The patient is advised to take ferrous sulfate 325 mg twice daily. Return to the office in 6 weeks for followup examination.  She desires the ParaGard IUD.  I have instructed her to call our office for coverage benefits and to refrain from intercourse until she has the ParaGard inserted.     Malachi Pro. Ambrose Mantle, M.D.     TFH/MEDQ  D:  09/06/2012  T:  09/06/2012  Job:  [id number]

## 2012-09-07 NOTE — Progress Notes (Signed)
Post discharge chart review completed.  

## 2012-09-08 ENCOUNTER — Inpatient Hospital Stay (HOSPITAL_COMMUNITY): Payer: Medicaid Other

## 2012-09-11 ENCOUNTER — Inpatient Hospital Stay (HOSPITAL_COMMUNITY): Admission: RE | Admit: 2012-09-11 | Payer: Medicaid Other | Source: Ambulatory Visit

## 2012-11-11 ENCOUNTER — Emergency Department (HOSPITAL_COMMUNITY)
Admission: EM | Admit: 2012-11-11 | Discharge: 2012-11-11 | Disposition: A | Discharge status: Home / Self Care | Payer: Medicaid Other | Attending: Emergency Medicine | Admitting: Emergency Medicine

## 2012-11-11 ENCOUNTER — Encounter (HOSPITAL_COMMUNITY): Payer: Self-pay | Admitting: Physical Medicine and Rehabilitation

## 2012-11-11 DIAGNOSIS — Z79899 Other long term (current) drug therapy: Secondary | ICD-10-CM | POA: Insufficient documentation

## 2012-11-11 DIAGNOSIS — R52 Pain, unspecified: Secondary | ICD-10-CM | POA: Insufficient documentation

## 2012-11-11 DIAGNOSIS — E039 Hypothyroidism, unspecified: Secondary | ICD-10-CM | POA: Insufficient documentation

## 2012-11-11 DIAGNOSIS — Z87448 Personal history of other diseases of urinary system: Secondary | ICD-10-CM | POA: Insufficient documentation

## 2012-11-11 DIAGNOSIS — R509 Fever, unspecified: Secondary | ICD-10-CM | POA: Insufficient documentation

## 2012-11-11 DIAGNOSIS — R05 Cough: Secondary | ICD-10-CM | POA: Insufficient documentation

## 2012-11-11 DIAGNOSIS — R059 Cough, unspecified: Secondary | ICD-10-CM | POA: Insufficient documentation

## 2012-11-11 DIAGNOSIS — J020 Streptococcal pharyngitis: Secondary | ICD-10-CM | POA: Insufficient documentation

## 2012-11-11 MED ORDER — CEPHALEXIN 500 MG PO CAPS: 500.0000 mg | ORAL_CAPSULE | Freq: Four times a day (QID) | ORAL | Status: DC

## 2012-11-11 NOTE — ED Notes (Signed)
Pt presents to department for evaluation of sore throat, chills and body aches. Ongoing for several day. 7/10 pain at the time. Pt is conscious alert and oriented x4. NAD.

## 2012-11-11 NOTE — ED Provider Notes (Signed)
History    This chart was scribed for Geoffery Lyons, MD, MD by Smitty Pluck, ED Scribe. The patient was seen in room TR04C and the patient's care was started at 6:44PM.   CSN: [id number]  Arrival date & time 11/11/12  1806      Chief Complaint  Patient presents with  . Sore Throat    (Consider location/radiation/quality/duration/timing/severity/associated sxs/prior treatment) The history is provided by the patient. No language interpreter was used.   Kristy Wright is a 21 y.o. female who presents to the Emergency Department complaining of constant, moderate sore throat onset 9 days ago. Pt reports having mild cough, fever, chills and generalized body aches. Pt denies diarrhea, abdominal pain, nausea, vomiting, diarrhea, sick contact and any other symptoms. She rates throat pain at 7/10.   Past Medical History  Diagnosis Date  . HYPOTHYROIDISM 03/13/2009  . Thyroid activity decreased   . Thyroid activity decreased   . Pyelonephritis     No past surgical history on file.  Family History  Problem Relation Age of Onset  . Thyroid disease Sister     uncertain type  . Hypertension Sister   . Stroke Other     Grandmother-CVA  . Cancer Mother     cervical  . Diabetes Maternal Grandmother   . Heart disease Maternal Grandmother   . Hypertension Maternal Grandmother   . Cancer Paternal Grandmother     breast  . Hypertension Paternal Grandmother   . Diabetes Paternal Grandfather     History  Substance Use Topics  . Smoking status: Never Smoker   . Smokeless tobacco: Never Used  . Alcohol Use: No    OB History    Grav Para Term Preterm Abortions TAB SAB Ect Mult Living   5 5 5       5       Review of Systems  Constitutional: Positive for fever and chills.  HENT: Positive for sore throat.   Respiratory: Positive for cough.   Gastrointestinal: Negative for nausea and vomiting.  Neurological: Negative for weakness.  All other systems reviewed and are  negative.    Allergies  Review of patient's allergies indicates no known allergies.  Home Medications   Current Outpatient Rx  Name  Route  Sig  Dispense  Refill  . LEVOTHYROXINE SODIUM 100 MCG PO TABS   Oral   Take 1 tablet (100 mcg total) by mouth daily.   30 tablet   3     BP 114/61  Pulse 100  Temp 98.7 F (37.1 C) (Oral)  Resp 18  SpO2 97%  Breastfeeding? Unknown  Physical Exam  Nursing note and vitals reviewed. Constitutional: She is oriented to person, place, and time. She appears well-developed and well-nourished. No distress.  HENT:  Head: Normocephalic and atraumatic.  Right Ear: External ear normal.  Left Ear: External ear normal.       erythematous oropharynx with minimal exudates.    Eyes: EOM are normal.  Neck: Neck supple. No tracheal deviation present.  Cardiovascular: Normal rate, regular rhythm and normal heart sounds.   Pulmonary/Chest: Effort normal and breath sounds normal. No respiratory distress.  Musculoskeletal: Normal range of motion.  Lymphadenopathy:    She has cervical adenopathy.  Neurological: She is alert and oriented to person, place, and time.  Skin: Skin is warm and dry.  Psychiatric: She has a normal mood and affect. Her behavior is normal.    ED Course  Procedures (including critical care time) DIAGNOSTIC STUDIES: Oxygen  Saturation is 97% on room air, normal by my interpretation.    COORDINATION OF CARE: 6:50 PM Discussed ED treatment with pt      Labs Reviewed  RAPID STREP SCREEN   No results found.   No diagnosis found.    MDM  The strep test is positive.  Will treat with keflex, tylenol, motrin.  Return prn.      I personally performed the services described in this documentation, which was scribed in my presence. The recorded information has been reviewed and is accurate.      Geoffery Lyons, MD 11/11/12 [phone]

## 2013-01-19 ENCOUNTER — Ambulatory Visit: Payer: Medicaid Other | Admitting: Endocrinology

## 2013-01-26 ENCOUNTER — Ambulatory Visit (INDEPENDENT_AMBULATORY_CARE_PROVIDER_SITE_OTHER): Payer: Medicaid Other | Admitting: Endocrinology

## 2013-01-26 ENCOUNTER — Ambulatory Visit: Payer: Medicaid Other | Admitting: Endocrinology

## 2013-01-26 ENCOUNTER — Encounter: Payer: Self-pay | Admitting: Endocrinology

## 2013-01-26 VITALS — BP 130/80 | HR 82 | Wt 165.0 lb

## 2013-01-26 DIAGNOSIS — E042 Nontoxic multinodular goiter: Secondary | ICD-10-CM

## 2013-01-26 LAB — TSH: TSH: 29.53 u[IU]/mL — ABNORMAL HIGH (ref 0.35–5.50)

## 2013-01-26 MED ORDER — LEVOTHYROXINE SODIUM 100 MCG PO TABS: 100.0000 ug | ORAL_TABLET | Freq: Every day | ORAL | Status: DC

## 2013-01-26 NOTE — Patient Instructions (Addendum)
blood tests are being requested for you today.  We'll contact you with results. Also, let's check a thyroid ultrasound.  you will receive a phone call, about a day and time for an appointment.  Based on the result, you may need a thyroid biopsy, which is an easy test.

## 2013-01-26 NOTE — Progress Notes (Signed)
  Subjective:    Patient ID: Kristy Wright, female    DOB: [birth year], 22 y.o.   MRN: [id number]  HPI Pt is here to f/u of chronic primary hypothyroidism (dx'ed 2009).  She is 2 years postpartum.  Synthroid was reduced by dr Ambrose Mantle, due to abnormal blood test, approx 4 mos ago.  She has a few mos of moderate hair loss throughout the head, and assoc blurry vision.  She has a "mirena" device. Past Medical History  Diagnosis Date  . HYPOTHYROIDISM 03/13/2009  . Thyroid activity decreased   . Thyroid activity decreased   . Pyelonephritis     No past surgical history on file.  History   Social History  . Marital Status: Single    Spouse Name: N/A    Number of Children: 3  . Years of Education: N/A   Occupational History  . Not on file.   Social History Main Topics  . Smoking status: Never Smoker   . Smokeless tobacco: Never Used  . Alcohol Use: No  . Drug Use: No  . Sexually Active: Yes    Birth Control/ Protection: None   Other Topics Concern  . Not on file   Social History Narrative   At age 70 began living with her older sister. States her mom was never really involved and she doesn't get along with her father. States they were "crazy parents". Doesn't know their history. She has 3 sons born in 2006, 2007, and 2009. Works at [employer]. Completed 10th grade but did not graduate from high school.    Current Outpatient Prescriptions on File Prior to Visit  Medication Sig Dispense Refill  . cephALEXin (KEFLEX) 500 MG capsule Take 1 capsule (500 mg total) by mouth 4 (four) times daily.  30 capsule  0  . [DISCONTINUED] norethindrone (CAMILA) 0.35 MG tablet Take 1 tablet by mouth daily.         No current facility-administered medications on file prior to visit.    No Known Allergies  Family History  Problem Relation Age of Onset  . Thyroid disease Sister     uncertain type  . Hypertension Sister   . Stroke Other     Grandmother-CVA  . Cancer Mother     cervical  .  Diabetes Maternal Grandmother   . Heart disease Maternal Grandmother   . Hypertension Maternal Grandmother   . Cancer Paternal Grandmother     breast  . Hypertension Paternal Grandmother   . Diabetes Paternal Grandfather    BP 130/80  Pulse 82  Wt 165 lb (74.844 kg)  BMI 27.46 kg/m2  SpO2 98%  Review of Systems She has difficulty with concentration, and hoarseness    Objective:   Physical Exam VITAL SIGNS:  See vs page GENERAL: no distress Neck:  thyroid is approx 5x normal size, with a multinodular surface (L>R)  Lab Results  Component Value Date   TSH 29.53* 01/26/2013      Assessment & Plan:  Chronic primary hypothyroidism, needs increased rx

## 2013-01-27 ENCOUNTER — Telehealth: Payer: Self-pay | Admitting: Endocrinology

## 2013-01-27 NOTE — Telephone Encounter (Signed)
The patient called to return call to [facility].  The patient may be reached at [phone].

## 2013-01-29 ENCOUNTER — Other Ambulatory Visit: Payer: Medicaid Other

## 2013-02-01 ENCOUNTER — Ambulatory Visit
Admission: RE | Admit: 2013-02-01 | Discharge: 2013-02-01 | Disposition: A | Discharge status: Home / Self Care | Payer: Medicaid Other | Source: Ambulatory Visit | Attending: Endocrinology | Admitting: Endocrinology

## 2013-02-03 ENCOUNTER — Telehealth: Payer: Self-pay | Admitting: Endocrinology

## 2013-02-03 NOTE — Telephone Encounter (Signed)
i sent this message to her on mychart: Your thyroid is enlarged and inflamed, but no nodule is found. The enlargement is due to the inflammation, which is also causing the underactive thyroid. i'll see you next time.

## 2013-02-03 NOTE — Telephone Encounter (Signed)
Pt advised and states an understanding 

## 2013-02-03 NOTE — Telephone Encounter (Signed)
Patient calling for thyroid u/s results. Please call patient at [phone] / Sherri S.

## 2013-03-08 ENCOUNTER — Telehealth: Payer: Self-pay | Admitting: Endocrinology

## 2013-03-08 ENCOUNTER — Other Ambulatory Visit: Payer: Self-pay | Admitting: Internal Medicine

## 2013-03-08 DIAGNOSIS — E042 Nontoxic multinodular goiter: Secondary | ICD-10-CM

## 2013-03-08 DIAGNOSIS — R531 Weakness: Secondary | ICD-10-CM

## 2013-03-08 NOTE — Telephone Encounter (Signed)
Noted. I added a vitamin D, CMP and CBC since she did not have labs in a while.

## 2013-03-08 NOTE — Telephone Encounter (Signed)
Pt concerned about hair loss, is this related to thyroid function. She is concerned that her meds are not working. Please call patient @ [phone] / Oneita Kras.

## 2013-03-08 NOTE — Telephone Encounter (Signed)
Please read phone note below and advise. Thank you.

## 2013-03-08 NOTE — Progress Notes (Signed)
It is approximately 6 weeks after her Synthroid dose has been increased, so she can return to have another thyroid test check. Kristy Wright, can you please let patient know to come for labs? I placed the orders in Epic. Thank you.

## 2013-03-08 NOTE — Telephone Encounter (Signed)
Called pt and let her know that it has been approximately 6 weeks since her Synthroid dose has been increased, that Dr Elvera Lennox would like her to return to have another thyroid test check. Advised her to come by any time this week before 4:30, except on Friday that we will be closed for Good Friday. Pt states she will come. Pt concerned that her hair continues to fall out and she feels like her bones are weak. She is concerned. Please advise.

## 2013-03-09 ENCOUNTER — Telehealth: Payer: Self-pay | Admitting: *Deleted

## 2013-03-09 NOTE — Telephone Encounter (Signed)
Called pt and told her that Dr Elvera Lennox  added a vitamin D, CMP and CBC since she did not have labs in a while. Advised her to be sure to come in this week and get her blood work done before 4:30 and that we would be closed on Friday.

## 2013-03-10 ENCOUNTER — Other Ambulatory Visit: Payer: Self-pay | Admitting: Endocrinology

## 2013-03-10 ENCOUNTER — Encounter: Payer: Self-pay | Admitting: Endocrinology

## 2013-03-10 ENCOUNTER — Ambulatory Visit (INDEPENDENT_AMBULATORY_CARE_PROVIDER_SITE_OTHER): Payer: Medicaid Other | Admitting: Endocrinology

## 2013-03-10 VITALS — BP 126/74 | HR 62 | Wt 163.0 lb

## 2013-03-10 DIAGNOSIS — E039 Hypothyroidism, unspecified: Secondary | ICD-10-CM

## 2013-03-10 LAB — TSH: TSH: 44.95 u[IU]/mL — ABNORMAL HIGH (ref 0.35–5.50)

## 2013-03-10 MED ORDER — LEVOTHYROXINE SODIUM 150 MCG PO TABS: 150.0000 ug | ORAL_TABLET | Freq: Every day | ORAL | Status: DC

## 2013-03-10 NOTE — Patient Instructions (Addendum)
blood tests are being requested for you today.  We'll contact you with results. Please see your new doctor at [facility] soon.   I would be happy to see you back here whenever you want.

## 2013-03-10 NOTE — Progress Notes (Signed)
  Subjective:    Patient ID: Kristy Wright, female    DOB: 01-Jul-1991, 22 y.o.   MRN: [id number]  HPI Pt is here to f/u of chronic primary hypothyroidism (dx'ed 2009).  She is 2 years postpartum.  Synthroid was reduced by dr Ambrose Mantle, due to abnormal blood test, in late 2013. However, it was increased again, due to elevated TSH, in march of 2014.  She reports discomfort at the anterior neck.  She reports difficulty with concentration.  She says she takes the synthroid on an empty stomach.   Past Medical History  Diagnosis Date  . HYPOTHYROIDISM 03/13/2009  . Thyroid activity decreased   . Thyroid activity decreased   . Pyelonephritis     No past surgical history on file.  History   Social History  . Marital Status: Single    Spouse Name: N/A    Number of Children: 3  . Years of Education: N/A   Occupational History  . Not on file.   Social History Main Topics  . Smoking status: Never Smoker   . Smokeless tobacco: Never Used  . Alcohol Use: No  . Drug Use: No  . Sexually Active: Yes    Birth Control/ Protection: None   Other Topics Concern  . Not on file   Social History Narrative   At age 84 began living with her older sister. States her mom was never really involved and she doesn't get along with her father. States they were "crazy parents". Doesn't know their history. She has 3 sons born in 2006, 2007, and 2009. Works at [employer]. Completed 10th grade but did not graduate from high school.    Current Outpatient Prescriptions on File Prior to Visit  Medication Sig Dispense Refill  . cephALEXin (KEFLEX) 500 MG capsule Take 1 capsule (500 mg total) by mouth 4 (four) times daily.  30 capsule  0  . levonorgestrel (MIRENA) 20 MCG/24HR IUD 1 each by Intrauterine route once.      Marland Kitchen levothyroxine (SYNTHROID, LEVOTHROID) 100 MCG tablet Take 1 tablet (100 mcg total) by mouth daily.  30 tablet  2  . [DISCONTINUED] norethindrone (CAMILA) 0.35 MG tablet Take 1 tablet by mouth daily.          No current facility-administered medications on file prior to visit.    No Known Allergies  Family History  Problem Relation Age of Onset  . Thyroid disease Sister     uncertain type  . Hypertension Sister   . Stroke Other     Grandmother-CVA  . Cancer Mother     cervical  . Diabetes Maternal Grandmother   . Heart disease Maternal Grandmother   . Hypertension Maternal Grandmother   . Cancer Paternal Grandmother     breast  . Hypertension Paternal Grandmother   . Diabetes Paternal Grandfather     BP 126/74  Pulse 62  Wt 163 lb (73.936 kg)  BMI 27.12 kg/m2  SpO2 98%  Review of Systems Denies sob.    Objective:   Physical Exam VITAL SIGNS:  See vs page GENERAL: no distress Neck: thyroid is approx 5x normal size, with a irregular surface (L>R) Psych: tearful.       Assessment & Plan:  Chronic hypothyroidism, with variable tsh Neck sxs, not thyroid-related

## 2013-05-12 ENCOUNTER — Ambulatory Visit: Payer: Medicaid Other | Admitting: Endocrinology

## 2013-07-22 ENCOUNTER — Telehealth: Payer: Self-pay | Admitting: Endocrinology

## 2013-07-22 MED ORDER — LEVOTHYROXINE SODIUM 150 MCG PO TABS: 150.0000 ug | ORAL_TABLET | Freq: Every day | ORAL | Status: DC

## 2013-07-22 NOTE — Telephone Encounter (Signed)
done

## 2013-07-23 ENCOUNTER — Ambulatory Visit: Payer: Medicaid Other | Admitting: Endocrinology

## 2013-08-08 ENCOUNTER — Encounter (HOSPITAL_COMMUNITY): Payer: Self-pay | Admitting: Nurse Practitioner

## 2013-08-08 ENCOUNTER — Emergency Department (HOSPITAL_COMMUNITY): Payer: Medicaid Other

## 2013-08-08 ENCOUNTER — Emergency Department (HOSPITAL_COMMUNITY)
Admission: EM | Admit: 2013-08-08 | Discharge: 2013-08-08 | Disposition: A | Discharge status: Home / Self Care | Payer: Medicaid Other | Attending: Emergency Medicine | Admitting: Emergency Medicine

## 2013-08-08 DIAGNOSIS — M545 Low back pain, unspecified: Secondary | ICD-10-CM | POA: Insufficient documentation

## 2013-08-08 DIAGNOSIS — R202 Paresthesia of skin: Secondary | ICD-10-CM

## 2013-08-08 DIAGNOSIS — Z79899 Other long term (current) drug therapy: Secondary | ICD-10-CM | POA: Insufficient documentation

## 2013-08-08 DIAGNOSIS — Z87448 Personal history of other diseases of urinary system: Secondary | ICD-10-CM | POA: Insufficient documentation

## 2013-08-08 DIAGNOSIS — R209 Unspecified disturbances of skin sensation: Secondary | ICD-10-CM | POA: Insufficient documentation

## 2013-08-08 DIAGNOSIS — M542 Cervicalgia: Secondary | ICD-10-CM | POA: Insufficient documentation

## 2013-08-08 DIAGNOSIS — E039 Hypothyroidism, unspecified: Secondary | ICD-10-CM | POA: Insufficient documentation

## 2013-08-08 NOTE — ED Notes (Signed)
Pt reports numb tingling sensation in upper back intermittent for past 8 months. Denies pain. Denies bowel/bladder changes, LOC. Denies injuries. Ambulatory.

## 2013-08-08 NOTE — ED Provider Notes (Signed)
CSN: [id number]     Arrival date & time 08/08/13  1212 History  This chart was scribed for non-physician practitioner Coral Ceo, PA-C, working with Glynn Octave, MD by Dorothey Baseman, ED Scribe. This patient was seen in room TR08C/TR08C and the patient's care was started at 1:08 PM.    Chief Complaint  Patient presents with  . Numbness   The history is provided by the patient. No language interpreter was used.   HPI Comments: Kristy Wright is a 22 y.o. female with a PMH of hypothyroidism who presents to the Emergency Department complaining of numbness. Numbness is intermittent and located in her upper back.  Her numbness begins near her left scapula and progresses to the right.  Her paresthesias began about 8 months ago that has been progressively worsening for the past 2 months. She states that the symptoms began after giving birth last October. She reports some associated intermittent neck pain and intermittent lower back pain that is not exacerbated with movement. She denies any potential injury to the area. She denies history of rashes to the area. She denies fever, chills, abdominal pain, nausea, vomiting, diarrhea, sore throat, rhinorrhea, or any other symptoms at this time.   Past Medical History  Diagnosis Date  . HYPOTHYROIDISM 03/13/2009  . Thyroid activity decreased   . Thyroid activity decreased   . Pyelonephritis    History reviewed. No pertinent past surgical history. Family History  Problem Relation Age of Onset  . Thyroid disease Sister     uncertain type  . Hypertension Sister   . Stroke Other     Grandmother-CVA  . Cancer Mother     cervical  . Diabetes Maternal Grandmother   . Heart disease Maternal Grandmother   . Hypertension Maternal Grandmother   . Cancer Paternal Grandmother     breast  . Hypertension Paternal Grandmother   . Diabetes Paternal Grandfather    History  Substance Use Topics  . Smoking status: Never Smoker   . Smokeless tobacco: Never  Used  . Alcohol Use: No   OB History   Grav Para Term Preterm Abortions TAB SAB Ect Mult Living   5 5 5       5      Review of Systems  Constitutional: Negative for fever, chills, diaphoresis, activity change, appetite change and fatigue.  HENT: Positive for neck pain. Negative for ear pain, sore throat, rhinorrhea and neck stiffness.   Eyes: Negative for visual disturbance.  Respiratory: Negative for cough and shortness of breath.   Cardiovascular: Negative for chest pain and leg swelling.  Gastrointestinal: Negative for nausea, vomiting, abdominal pain and diarrhea.  Genitourinary: Negative for dysuria.  Musculoskeletal: Positive for back pain. Negative for gait problem.  Skin: Negative for color change, rash and wound.  Neurological: Positive for numbness. Negative for dizziness, syncope, weakness, light-headedness and headaches.  Psychiatric/Behavioral: Negative for confusion.  All other systems reviewed and are negative.    Allergies  Review of patient's allergies indicates no known allergies.  Home Medications   Current Outpatient Rx  Name  Route  Sig  Dispense  Refill  . levothyroxine (SYNTHROID, LEVOTHROID) 150 MCG tablet   Oral   Take 1 tablet (150 mcg total) by mouth daily before breakfast.   30 tablet   2   . Multiple Vitamins-Calcium (ONE-A-DAY WOMENS PO)   Oral   Take 1 tablet by mouth daily.         Marland Kitchen levonorgestrel (MIRENA) 20 MCG/24HR IUD  Intrauterine   1 each by Intrauterine route once.          Triage Vitals: BP 107/60  Pulse 56  Temp(Src) 98.4 F (36.9 C) (Oral)  Resp 16  SpO2 97%  Filed Vitals:   08/08/13 1215 08/08/13 1510  BP: 107/60 134/88  Pulse: 56 52  Temp: 98.4 F (36.9 C)   TempSrc: Oral   Resp: 16 18  SpO2: 97% 98%     Physical Exam  Nursing note and vitals reviewed. Constitutional: She is oriented to person, place, and time. She appears well-developed and well-nourished. No distress.  HENT:  Head: Normocephalic  and atraumatic.  Right Ear: External ear normal.  Left Ear: External ear normal.  Nose: Nose normal.  Mouth/Throat: Oropharynx is clear and moist. No oropharyngeal exudate.  Eyes: Conjunctivae are normal. Pupils are equal, round, and reactive to light. Right eye exhibits no discharge. Left eye exhibits no discharge.  Neck: Normal range of motion. Neck supple.  No tenderness to palpation to the neck. No step-offs.  No limitations with ROM.    Cardiovascular: Normal rate, regular rhythm, normal heart sounds and intact distal pulses.  Exam reveals no gallop and no friction rub.   No murmur heard. Pulmonary/Chest: Effort normal and breath sounds normal. No respiratory distress. She has no wheezes. She has no rales. She exhibits no tenderness.  Abdominal: Soft. She exhibits no distension. There is no tenderness.  Musculoskeletal: Normal range of motion. She exhibits no edema and no tenderness.  Tenderness to palpation to the mid-thoracic spine. Strong radial pulses. No leg swelling.   Neurological: She is alert and oriented to person, place, and time.  GCS 15. No focal neurological deficits. CN's 2-12 intact. Patient able to ambulate without difficulty or ataxia. Strength 5/5 in the upper and lower extremities bilaterally.  Gross sensation intact in the upper and lower extremities bilaterally.   Skin: Skin is warm and dry. No rash noted. She is not diaphoretic. No erythema.     No rashes, edema, erythema, ecchymosis, or wounds to the back throughout  Psychiatric: She has a normal mood and affect. Her behavior is normal.    ED Course  Procedures (including critical care time)  DIAGNOSTIC STUDIES: Oxygen Saturation is 97% on room air, normal by my interpretation.    COORDINATION OF CARE: 1:13PM- Discussed that symptoms could be due to a bulging disc that may be pinching a nerve. Will order x-rays as per request of the patient. Will order CT of the head as per advice of the attending physician.  Discussed treatment plan with patient at bedside and patient verbalized agreement.   Labs Review Labs Reviewed - No data to display  Imaging Review Dg Thoracic Spine 2 View  08/08/2013   CLINICAL DATA:  22 year old female with numbness and tingling.  EXAM: THORACIC SPINE - 2 VIEW  COMPARISON:  None.  FINDINGS: Bone mineralization is within normal limits. Normal thoracic segmentation. Normal thoracic vertebral height and alignment. Cervicothoracic junction alignment is within normal limits. Preserved disc spaces. Grossly negative visualized thoracic visceral contours.  IMPRESSION: Negative.   Electronically Signed   By: Augusto Gamble M.D.   On: 08/08/2013 14:34   Ct Head Wo Contrast  08/08/2013   *RADIOLOGY REPORT*  Clinical Data: There back numbness for 2 months  CT HEAD WITHOUT CONTRAST  Technique:  Contiguous axial images were obtained from the base of the skull through the vertex without contrast.  Comparison: None.  Findings: The calvarium is intact.  There  is normal sulcation and attenuation with no evidence of hemorrhage, extra-axial fluid, infarct, mass, or hydrocephalus.  IMPRESSION: Negative   Original Report Authenticated By: Esperanza Heir, M.D.    MDM   1. Paresthesias     Kristy Wright is a 21 y.o. female with a PMH of hypothyroidism who presents to the Emergency Department complaining of numbness. Head CT and thoracic spine x-rays ordered to further evaluate.     Etiology of numbness is unclear.  Head CT negative for an acute intracranial process.  Thoracic spine x-rays negative for fracture or dislocation.  Patient was neurovascularly intact.  She was non-toxic in appearance and remained in no acute distress throughout her ED visit.  Patient given referral to neurology and instructed to make appointment as soon as possible for further evaluation.  Patient was instructed to return to the ED if they experience any weakness, fever, loss of bowel/bladder, or other concerns.  Patient was  in agreement with discharge and plan.     Final impressions: 1. Paresthesias     Luiz Iron PA-C   This patient was discussed with Dr. Manus Gunning   I personally performed the services described in this documentation, which was scribed in my presence. The recorded information has been reviewed and is accurate.   Jillyn Ledger, PA-C 08/10/13 2153

## 2013-08-10 NOTE — ED Provider Notes (Signed)
Medical screening examination/treatment/procedure(s) were performed by non-physician practitioner and as supervising physician I was immediately available for consultation/collaboration.   Glynn Octave, MD 08/10/13 2155

## 2013-08-19 ENCOUNTER — Ambulatory Visit: Payer: Medicaid Other | Admitting: Endocrinology

## 2013-08-26 ENCOUNTER — Ambulatory Visit: Payer: Medicaid Other | Attending: Family Medicine | Admitting: Family Medicine

## 2013-08-26 ENCOUNTER — Encounter: Payer: Self-pay | Admitting: Family Medicine

## 2013-08-26 VITALS — BP 104/68 | HR 54 | Temp 97.8°F | Resp 14 | Ht 65.0 in | Wt 156.0 lb

## 2013-08-26 DIAGNOSIS — E039 Hypothyroidism, unspecified: Secondary | ICD-10-CM

## 2013-08-26 DIAGNOSIS — Z23 Encounter for immunization: Secondary | ICD-10-CM

## 2013-08-26 DIAGNOSIS — E042 Nontoxic multinodular goiter: Secondary | ICD-10-CM

## 2013-08-26 DIAGNOSIS — M549 Dorsalgia, unspecified: Secondary | ICD-10-CM | POA: Insufficient documentation

## 2013-08-26 DIAGNOSIS — Z975 Presence of (intrauterine) contraceptive device: Secondary | ICD-10-CM

## 2013-08-26 DIAGNOSIS — G8929 Other chronic pain: Secondary | ICD-10-CM

## 2013-08-26 LAB — COMPLETE METABOLIC PANEL WITH GFR
ALT: 20 U/L (ref 0–35)
Albumin: 4.6 g/dL (ref 3.5–5.2)
CO2: 29 mEq/L (ref 19–32)
Calcium: 9.5 mg/dL (ref 8.4–10.5)
Chloride: 103 mEq/L (ref 96–112)
GFR, Est African American: >89 mL/min
GFR, Est Non African American: >89 mL/min
Potassium: 4.3 mEq/L (ref 3.5–5.3)
Sodium: 139 mEq/L (ref 135–145)
Total Protein: 7.4 g/dL (ref 6.0–8.3)

## 2013-08-26 LAB — CBC
Platelets: 208 10*3/uL (ref 150–400)
RBC: 4.43 MIL/uL (ref 3.87–5.11)
WBC: 5.3 10*3/uL (ref 4.0–10.5)

## 2013-08-26 NOTE — Patient Instructions (Addendum)
Back Pain, Adult Back pain is very common. The pain often gets better over time. The cause of back pain is usually not dangerous. Most people can learn to manage their back pain on their own.  HOME CARE   Stay active. Start with short walks on flat ground if you can. Try to walk farther each day.  Do not sit, drive, or stand in one place for more than 30 minutes. Do not stay in bed.  Do not avoid exercise or work. Activity can help your back heal faster.  Be careful when you bend or lift an object. Bend at your knees, keep the object close to you, and do not twist.  Sleep on a firm mattress. Lie on your side, and bend your knees. If you lie on your back, put a pillow under your knees.  Only take medicines as told by your doctor.  Put ice on the injured area.  Put ice in a plastic bag.  Place a towel between your skin and the bag.  Leave the ice on for 15-20 minutes, 3-4 times a day for the first 2 to 3 days. After that, you can switch between ice and heat packs.  Ask your doctor about back exercises or massage.  Avoid feeling anxious or stressed. Find good ways to deal with stress, such as exercise. GET HELP RIGHT AWAY IF:   Your pain does not go away with rest or medicine.  Your pain does not go away in 1 week.  You have new problems.  You do not feel well.  The pain spreads into your legs.  You cannot control when you poop (bowel movement) or pee (urinate).  Your arms or legs feel weak or lose feeling (numbness).  You feel sick to your stomach (nauseous) or throw up (vomit).  You have belly (abdominal) pain.  You feel like you may pass out (faint). MAKE SURE YOU:   Understand these instructions.  Will watch your condition.  Will get help right away if you are not doing well or get worse. Document Released: 04/29/2008 Document Revised: 02/03/2012 Document Reviewed: 04/01/2011 [facility] Patient Information 2014 [place], [place]. Hypothyroidism The thyroid is a  large gland located in the lower front of your neck. The thyroid gland helps control metabolism. Metabolism is how your body handles food. It controls metabolism with the hormone thyroxine. When this gland is underactive (hypothyroid), it produces too little hormone.  CAUSES These include:   Absence or destruction of thyroid tissue.  Goiter due to iodine deficiency.  Goiter due to medications.  Congenital defects (since birth).  Problems with the pituitary. This causes a lack of TSH (thyroid stimulating hormone). This hormone tells the thyroid to turn out more hormone. SYMPTOMS  Lethargy (feeling as though you have no energy)  Cold intolerance  Weight gain (in spite of normal food intake)  Dry skin  Coarse hair  Menstrual irregularity (if severe, may lead to infertility)  Slowing of thought processes Cardiac problems are also caused by insufficient amounts of thyroid hormone. Hypothyroidism in the newborn is cretinism, and is an extreme form. It is important that this form be treated adequately and immediately or it will lead rapidly to retarded physical and mental development. DIAGNOSIS  To prove hypothyroidism, your caregiver may do blood tests and ultrasound tests. Sometimes the signs are hidden. It may be necessary for your caregiver to watch this illness with blood tests either before or after diagnosis and treatment. TREATMENT  Low levels of thyroid hormone  are increased by using synthetic thyroid hormone. This is a safe, effective treatment. It usually takes about four weeks to gain the full effects of the medication. After you have the full effect of the medication, it will generally take another four weeks for problems to leave. Your caregiver may start you on low doses. If you have had heart problems the dose may be gradually increased. It is generally not an emergency to get rapidly to normal. HOME CARE INSTRUCTIONS   Take your medications as your caregiver suggests. Let  your caregiver know of any medications you are taking or start taking. Your caregiver will help you with dosage schedules.  As your condition improves, your dosage needs may increase. It will be necessary to have continuing blood tests as suggested by your caregiver.  Report all suspected medication side effects to your caregiver. SEEK MEDICAL CARE IF: Seek medical care if you develop:  Sweating.  Tremulousness (tremors).  Anxiety.  Rapid weight loss.  Heat intolerance.  Emotional swings.  Diarrhea.  Weakness. SEEK IMMEDIATE MEDICAL CARE IF:  You develop chest pain, an irregular heart beat (palpitations), or a rapid heart beat. MAKE SURE YOU:   Understand these instructions.  Will watch your condition.  Will get help right away if you are not doing well or get worse. Document Released: 11/11/2005 Document Revised: 02/03/2012 Document Reviewed: 07/01/2008 [facility] Patient Information 2014 [place], [place].

## 2013-08-26 NOTE — Progress Notes (Signed)
PT HERE TO ESTABLISH CARE NEEDS THYROID LEVELS CHECKED TAKING LEVOTHYROXINE 150 MCG NEED REFERRAL FOR UPPER BACK NUMBNESS

## 2013-08-26 NOTE — Progress Notes (Signed)
Patient ID: Kristy Wright, female   DOB: December 12, 1990, 22 y.o.   MRN: [id number]  CC:  Establish Care   HPI: New patient presenting today with multiple complaints.  She reports that she's had an IUD in place for the past 12 months.  She reports that she would like to have it evaluated.  She needs a referral to an oncologist.  The patient also reports that she has hypothyroidism and has been followed by an endocrinologist but has not been seen in the last 6 months.  She reports that she continues to take her medication daily and has not missed any doses of levothyroxine 150 mcg.  The patient also reports that she suffers from chronic thoracic back pain.  She reports that she had an exacerbation when she was pregnant but has had persistent paresthesias in the thoracic spine area and also up between the shoulder blades.  She went to the emergency department and had an x-ray that came back within normal limits.  She was told that she may need to have further imaging if her symptoms persist.  She denies having any weakness. The patient reports that her energy levels have been relatively normal.  No Known Allergies Past Medical History  Diagnosis Date  . HYPOTHYROIDISM 03/13/2009  . Thyroid activity decreased   . Thyroid activity decreased   . Pyelonephritis    Current Outpatient Prescriptions on File Prior to Visit  Medication Sig Dispense Refill  . levothyroxine (SYNTHROID, LEVOTHROID) 150 MCG tablet Take 1 tablet (150 mcg total) by mouth daily before breakfast.  30 tablet  2  . Multiple Vitamins-Calcium (ONE-A-DAY WOMENS PO) Take 1 tablet by mouth daily.      Marland Kitchen levonorgestrel (MIRENA) 20 MCG/24HR IUD 1 each by Intrauterine route once.      . [DISCONTINUED] norethindrone (CAMILA) 0.35 MG tablet Take 1 tablet by mouth daily.         No current facility-administered medications on file prior to visit.   Family History  Problem Relation Age of Onset  . Thyroid disease Sister     uncertain type  .  Hypertension Sister   . Stroke Other     Grandmother-CVA  . Cancer Mother     cervical  . Diabetes Maternal Grandmother   . Heart disease Maternal Grandmother   . Hypertension Maternal Grandmother   . Cancer Paternal Grandmother     breast  . Hypertension Paternal Grandmother   . Diabetes Paternal Grandfather    History   Social History  . Marital Status: Single    Spouse Name: N/A    Number of Children: 3  . Years of Education: N/A   Occupational History  . Not on file.   Social History Main Topics  . Smoking status: Never Smoker   . Smokeless tobacco: Never Used  . Alcohol Use: No  . Drug Use: No  . Sexual Activity: Yes    Birth Control/ Protection: None   Other Topics Concern  . Not on file   Social History Narrative   At age 42 began living with her older sister. States her mom was never really involved and she doesn't get along with her father. States they were "crazy parents". Doesn't know their history. She has 3 sons born in 2006, 2007, and 2009. Works at [employer]. Completed 10th grade but did not graduate from high school.    Review of Systems  Constitutional: Negative for fever, chills, diaphoresis, activity change, appetite change and fatigue.  HENT: Negative  for ear pain, nosebleeds, congestion, facial swelling, rhinorrhea, neck pain, neck stiffness and ear discharge.   Eyes: Negative for pain, discharge, redness, itching and visual disturbance.  Respiratory: Negative for cough, choking, chest tightness, shortness of breath, wheezing and stridor.   Cardiovascular: Negative for chest pain, palpitations and leg swelling.  Gastrointestinal: Negative for abdominal distention.  Genitourinary: Negative for dysuria, urgency, frequency, hematuria, flank pain, decreased urine volume, difficulty urinating and dyspareunia.  Musculoskeletal: Positive for back pain, Negative for joint swelling, arthralgias and gait problem.  Neurological: Negative for dizziness, tremors,  seizures, syncope, facial asymmetry, speech difficulty, weakness, light-headedness, numbness and headaches.  Hematological: Negative for adenopathy. Does not bruise/bleed easily.  Psychiatric/Behavioral: Negative for hallucinations, behavioral problems, confusion, dysphoric mood, decreased concentration and agitation.    Objective:   Filed Vitals:   08/26/13 1018  BP: 104/68  Pulse: 54  Temp: 97.8 F (36.6 C)  Resp: 14    Physical Exam  Constitutional: Appears well-developed and well-nourished. No distress.  HENT: Normocephalic. External right and left ear normal. Oropharynx is clear and moist.  Eyes: Conjunctivae and EOM are normal. PERRLA, no scleral icterus.  Neck: Normal ROM. Neck supple. No JVD. No tracheal deviation. No thyromegaly.  CVS: RRR, S1/S2 +, no murmurs, no gallops, no carotid bruit.  Pulmonary: Effort and breath sounds normal, no stridor, rhonchi, wheezes, rales.  Abdominal: Soft. BS +,  no distension, tenderness, rebound or guarding.  Musculoskeletal: Normal range of motion. No edema and no tenderness.  Lymphadenopathy: No lymphadenopathy noted, cervical, inguinal. Neuro: Alert. Normal reflexes, muscle tone coordination. No cranial nerve deficit. Skin: Skin is warm and dry. No rash noted. Not diaphoretic. No erythema. No pallor.  Psychiatric: Normal mood and affect. Behavior, judgment, thought content normal.   Lab Results  Component Value Date   WBC 7.1 09/05/2012   HGB 8.9* 09/05/2012   HCT 27.6* 09/05/2012   MCV 86.8 09/05/2012   PLT 138* 09/05/2012   Lab Results  Component Value Date   CREATININE 0.8 01/24/2011   BUN 17 01/24/2011   NA 140 01/24/2011   K 3.8 01/24/2011   CL 104 01/24/2011   CO2 24 08/09/2008    No results found for this basename: HGBA1C   Lipid Panel  No results found for this basename: chol, trig, hdl, cholhdl, vldl, ldlcalc    Assessment and plan:   Patient Active Problem List   Diagnosis Date Noted  . Back pain, chronic  08/26/2013  . IUD (intrauterine device) in place 08/26/2013  . Thyroid activity decreased   . Nontoxic multinodular goiter 04/06/2011  . HYPOTHYROIDISM 03/13/2009  . UTI 03/13/2009   IUD (intrauterine device) in place - Plan: Ambulatory referral to Gynecology, Ambulatory referral to Endocrinology, TSH, CBC, COMPLETE METABOLIC PANEL WITH GFR, Flu vaccine greater than 3yo with preservative IM (Fluzone trivalent)  Unspecified hypothyroidism - Plan: TSH, CBC, COMPLETE METABOLIC PANEL WITH GFR, Flu vaccine greater than 3yo with preservative IM (Fluzone trivalent)  Back pain, chronic - Plan: AMB referral to orthopedics  Check TSH and other labs and follow results.   Flu vaccine today  Refer to Endo and GYN  Pt request orthopedics referral for back pain and consideration of MRI study.  Recommended tylenol ES for pain as needed  RTC in 6 weeks for recheck  The patient was given clear instructions to go to ER or return to medical center if symptoms don't improve, worsen or new problems develop.  The patient verbalized understanding.  The patient was told to call  to get any lab results if not heard anything in the next week.    Rodney Langton, MD, CDE, FAAFP [facility] [place], [place]

## 2013-08-27 LAB — TSH: TSH: 2.49 u[IU]/mL (ref 0.350–4.500)

## 2013-08-27 NOTE — Progress Notes (Signed)
Quick Note:  Please inform patient that her thyroid test came back normal. All her other labs came back normal. Recheck labs in 3-4 months.  Rodney Langton, MD, CDE, FAAFP [facility] [place], [place]   ______

## 2013-08-30 ENCOUNTER — Telehealth: Payer: Self-pay | Admitting: Emergency Medicine

## 2013-08-30 ENCOUNTER — Encounter: Payer: Self-pay | Admitting: Family Medicine

## 2013-08-30 NOTE — Telephone Encounter (Signed)
Message copied by Darlis Loan on Mon Aug 30, 2013 11:23 AM ------      Message from: Cleora Fleet      Created: Fri Aug 27, 2013  7:34 AM       Please inform patient that her thyroid test came back normal. All her other labs came back normal.   Recheck labs in 3-4 months.            Rodney Langton, MD, CDE, FAAFP      [facility]      [facility]      [place], [place]        ------

## 2013-08-30 NOTE — Telephone Encounter (Signed)
Test results given

## 2013-09-01 ENCOUNTER — Ambulatory Visit: Payer: Medicaid Other | Admitting: Endocrinology

## 2013-09-02 ENCOUNTER — Ambulatory Visit: Payer: Medicaid Other | Admitting: Endocrinology

## 2013-09-09 ENCOUNTER — Encounter: Payer: Self-pay | Admitting: Sports Medicine

## 2013-09-09 ENCOUNTER — Ambulatory Visit (INDEPENDENT_AMBULATORY_CARE_PROVIDER_SITE_OTHER): Payer: Medicaid Other | Admitting: Sports Medicine

## 2013-09-09 VITALS — BP 110/74 | HR 73 | Ht 65.0 in | Wt 156.0 lb

## 2013-09-09 DIAGNOSIS — M546 Pain in thoracic spine: Secondary | ICD-10-CM

## 2013-09-09 MED ORDER — MELOXICAM 15 MG PO TABS: 15.0000 mg | ORAL_TABLET | Freq: Every day | ORAL | Status: DC

## 2013-09-10 NOTE — Progress Notes (Signed)
  Subjective:    Patient ID: Kristy Wright, female    DOB: 30-Aug-1991, 22 y.o.   MRN: [id number]  HPI chief complaint: Mid and upper back pain  Very pleasant 22 year old female comes in today complaining of 1 year of mid to upper back pain. Symptoms began shortly after she delivered her son. She describes a numbness which radiates in between each scapula and at times to the base of the neck. It is becoming more and more constant. No radiating pain into her arms or legs. No numbness or tingling in her arms or legs. She has tried intermittent doses of over-the-counter Motrin without any help. He had x-rays of her thoracic spine done recently which were basically unremarkable. He was referred to [place] by her primary care physician for further treatment.  Past medical history and current medications are reviewed No known drug allergies    Review of Systems     Objective:   Physical Exam Well-developed, well-nourished. No acute distress. Sitting comfortably in the exam room.  Cervical spine: Full cervical range of motion. No tenderness along cervical midline. Negative Spurling's bilaterally. There is diffuse tenderness to palpation between each scapula. There is no palpable trigger point. No scapular winging. Neurological exam: Full-strength both upper and lower extremity. Reflexes are brisk and equal in upper and lower extremities.       Assessment & Plan:  Mechanical mid back pain  Patient is reassured of her x-rays. Mobic 15 mg daily with food as needed. Patient would benefit most from formal physical therapy. I think she would benefit from a home TENS unit. Followup in one month.

## 2013-09-23 ENCOUNTER — Ambulatory Visit: Payer: Medicaid Other | Attending: Sports Medicine

## 2013-09-23 DIAGNOSIS — R293 Abnormal posture: Secondary | ICD-10-CM | POA: Insufficient documentation

## 2013-09-23 DIAGNOSIS — IMO0001 Reserved for inherently not codable concepts without codable children: Secondary | ICD-10-CM | POA: Insufficient documentation

## 2013-09-23 DIAGNOSIS — M4000 Postural kyphosis, site unspecified: Secondary | ICD-10-CM | POA: Insufficient documentation

## 2013-10-07 ENCOUNTER — Ambulatory Visit (INDEPENDENT_AMBULATORY_CARE_PROVIDER_SITE_OTHER): Payer: Medicaid Other | Admitting: Sports Medicine

## 2013-10-07 VITALS — BP 116/74 | HR 56

## 2013-10-07 DIAGNOSIS — M546 Pain in thoracic spine: Secondary | ICD-10-CM

## 2013-10-07 MED ORDER — GABAPENTIN 100 MG PO CAPS: 100.0000 mg | ORAL_CAPSULE | Freq: Every day | ORAL | Status: DC

## 2013-10-08 ENCOUNTER — Encounter: Payer: Medicaid Other | Admitting: Family Medicine

## 2013-10-08 NOTE — Progress Notes (Signed)
  Subjective:    Patient ID: Kristy Wright, female    DOB: 08/09/1991, 22 y.o.   MRN: [id number]  HPI Patient comes in today for followup on the mid and upper back pain. She was able to attend a single session of physical therapy but did not find it helpful. Mobic has not been helpful either. Her main complaint is not necessarily pain but rather numbness which begins at the base of her neck and radiates into each scapula. No radiating numbness into her arms. Her numbness is most noticeable at rest and actually improves with activity.    Review of Systems     Objective:   Physical Exam Well-developed, sitting comfortably in the exam room.  Full cervical range of motion. No tenderness along cervical midline or paraspinal musculature. No real tenderness or spasm of the trapezius or parascapular musculature. No trigger point. No gross focal neurological deficits of either upper extremity       Assessment & Plan:  Neuropathic mid back pain  Since her main complaint is numbness and not necessarily pain I will have her discontinue the Mobic and start Neurontin 100 mg each bedtime. I've asked her to call me in one week's time for a check on her progress. If she is tolerating the medication but still symptomatic I will likely have her increase the dose. I believe her symptoms are likely mechanical in nature and not necessarily do to cervical pathology.

## 2013-10-11 ENCOUNTER — Ambulatory Visit: Payer: Medicaid Other

## 2013-10-18 ENCOUNTER — Ambulatory Visit: Payer: Medicaid Other | Admitting: Endocrinology

## 2013-12-10 ENCOUNTER — Other Ambulatory Visit: Payer: Self-pay | Admitting: *Deleted

## 2013-12-10 MED ORDER — LEVOTHYROXINE SODIUM 150 MCG PO TABS: 150.0000 ug | ORAL_TABLET | Freq: Every day | ORAL | Status: DC

## 2013-12-21 ENCOUNTER — Ambulatory Visit: Payer: Medicaid Other | Admitting: Endocrinology

## 2013-12-22 ENCOUNTER — Telehealth: Payer: Self-pay

## 2013-12-22 NOTE — Telephone Encounter (Signed)
No set limit.

## 2013-12-22 NOTE — Telephone Encounter (Signed)
Stephanie informed.

## 2013-12-22 NOTE — Telephone Encounter (Signed)
Stephanie sent me a staff messages inquiring if you have a no show limit. This pt has no showed at least 3 times. Please advise, Thanks!

## 2014-01-14 ENCOUNTER — Other Ambulatory Visit (INDEPENDENT_AMBULATORY_CARE_PROVIDER_SITE_OTHER): Payer: Medicaid Other

## 2014-01-14 DIAGNOSIS — R5383 Other fatigue: Secondary | ICD-10-CM

## 2014-01-14 DIAGNOSIS — R531 Weakness: Secondary | ICD-10-CM

## 2014-01-14 DIAGNOSIS — E042 Nontoxic multinodular goiter: Secondary | ICD-10-CM

## 2014-01-14 DIAGNOSIS — R5381 Other malaise: Secondary | ICD-10-CM

## 2014-01-14 LAB — COMPREHENSIVE METABOLIC PANEL
ALT: 18 U/L (ref 0–35)
AST: 19 U/L (ref 0–37)
Albumin: 4.3 g/dL (ref 3.5–5.2)
Alkaline Phosphatase: 64 U/L (ref 39–117)
BILIRUBIN TOTAL: 0.7 mg/dL (ref 0.3–1.2)
BUN: 20 mg/dL (ref 6–23)
CO2: 30 mEq/L (ref 19–32)
Calcium: 9.2 mg/dL (ref 8.4–10.5)
Chloride: 102 mEq/L (ref 96–112)
Creatinine, Ser: 0.6 mg/dL (ref 0.4–1.2)
GFR: 122.86 mL/min (ref >60.00)
GLUCOSE: 84 mg/dL (ref 70–99)
Potassium: 3.9 mEq/L (ref 3.5–5.1)
SODIUM: 137 meq/L (ref 135–145)
Total Protein: 7.8 g/dL (ref 6.0–8.3)

## 2014-01-14 LAB — CBC
HEMATOCRIT: 42.3 % (ref 36.0–46.0)
HEMOGLOBIN: 13.8 g/dL (ref 12.0–15.0)
MCHC: 32.5 g/dL (ref 30.0–36.0)
MCV: 91.6 fl (ref 78.0–100.0)
Platelets: 225 10*3/uL (ref 150.0–400.0)
RBC: 4.62 Mil/uL (ref 3.87–5.11)
RDW: 13.4 % (ref 11.5–14.6)
WBC: 5.7 10*3/uL (ref 4.5–10.5)

## 2014-01-14 LAB — TSH: TSH: 1.21 u[IU]/mL (ref 0.35–5.50)

## 2014-01-14 LAB — T4, FREE: FREE T4: 0.83 ng/dL (ref 0.60–1.60)

## 2014-01-15 LAB — VITAMIN D 25 HYDROXY (VIT D DEFICIENCY, FRACTURES): VIT D 25 HYDROXY: 38 ng/mL (ref 30–89)

## 2014-01-19 ENCOUNTER — Telehealth: Payer: Self-pay | Admitting: *Deleted

## 2014-01-19 NOTE — Telephone Encounter (Signed)
Pt called and lvm stating she had lab work done last week and was requesting results. Please advise.

## 2014-01-19 NOTE — Telephone Encounter (Signed)
Kristy Wright pt - will redirect to him.

## 2014-01-20 NOTE — Telephone Encounter (Signed)
Normal--on my chart

## 2014-01-21 NOTE — Telephone Encounter (Signed)
Pt informed

## 2014-02-03 ENCOUNTER — Other Ambulatory Visit: Payer: Self-pay | Admitting: Endocrinology

## 2014-02-04 ENCOUNTER — Other Ambulatory Visit: Payer: Self-pay | Admitting: *Deleted

## 2014-02-04 MED ORDER — LEVOTHYROXINE SODIUM 150 MCG PO TABS: 150.0000 ug | ORAL_TABLET | Freq: Every day | ORAL | Status: DC

## 2014-05-18 ENCOUNTER — Other Ambulatory Visit: Payer: Self-pay

## 2014-05-18 MED ORDER — LEVOTHYROXINE SODIUM 150 MCG PO TABS: 150.0000 ug | ORAL_TABLET | Freq: Every day | ORAL | Status: DC

## 2014-05-29 ENCOUNTER — Encounter (HOSPITAL_COMMUNITY): Payer: Self-pay | Admitting: Emergency Medicine

## 2014-05-29 ENCOUNTER — Emergency Department (INDEPENDENT_AMBULATORY_CARE_PROVIDER_SITE_OTHER)
Admission: EM | Admit: 2014-05-29 | Discharge: 2014-05-29 | Disposition: A | Discharge status: Home / Self Care | Payer: Medicaid Other | Source: Home / Self Care | Attending: Family Medicine | Admitting: Family Medicine

## 2014-05-29 DIAGNOSIS — J069 Acute upper respiratory infection, unspecified: Secondary | ICD-10-CM

## 2014-05-29 LAB — POCT RAPID STREP A: Streptococcus, Group A Screen (Direct): NEGATIVE

## 2014-05-29 MED ORDER — IPRATROPIUM BROMIDE 0.06 % NA SOLN: 2.0000 | Freq: Four times a day (QID) | NASAL | Status: DC

## 2014-05-29 NOTE — Discharge Instructions (Signed)
Drink plenty of fluids as discussed, use medicine as prescribed, and salt water gargle . Return or see your doctor if further problems

## 2014-05-29 NOTE — ED Provider Notes (Signed)
CSN: [id number]     Arrival date & time 05/29/14  1343 History   First MD Initiated Contact with Patient 05/29/14 1357     Chief Complaint  Patient presents with  . Sore Throat   (Consider location/radiation/quality/duration/timing/severity/associated sxs/prior Treatment) Patient is a 23 y.o. female presenting with pharyngitis. The history is provided by the patient.  Sore Throat This is a new problem. The current episode started 2 days ago. The problem has been gradually worsening. Pertinent negatives include no chest pain, no abdominal pain and no shortness of breath. The symptoms are aggravated by swallowing.    Past Medical History  Diagnosis Date  . HYPOTHYROIDISM 03/13/2009  . Thyroid activity decreased   . Thyroid activity decreased   . Pyelonephritis    History reviewed. No pertinent past surgical history. Family History  Problem Relation Age of Onset  . Thyroid disease Sister     uncertain type  . Hypertension Sister   . Stroke Other     Grandmother-CVA  . Cancer Mother     cervical  . Diabetes Maternal Grandmother   . Heart disease Maternal Grandmother   . Hypertension Maternal Grandmother   . Cancer Paternal Grandmother     breast  . Hypertension Paternal Grandmother   . Diabetes Paternal Grandfather    History  Substance Use Topics  . Smoking status: Never Smoker   . Smokeless tobacco: Never Used  . Alcohol Use: No   OB History   Grav Para Term Preterm Abortions TAB SAB Ect Mult Living   5 5 5       5      Review of Systems  Constitutional: Negative for fever and chills.  HENT: Positive for congestion, postnasal drip and rhinorrhea.   Eyes: Negative.   Respiratory: Positive for cough. Negative for shortness of breath and wheezing.   Cardiovascular: Negative for chest pain and leg swelling.  Gastrointestinal: Negative for abdominal pain.    Allergies  Review of patient's allergies indicates no known allergies.  Home Medications   Prior to  Admission medications   Medication Sig Start Date End Date Taking? Authorizing Provider  gabapentin (NEURONTIN) 100 MG capsule Take 1 capsule (100 mg total) by mouth at bedtime. 10/07/13   Ozzie Hoyleimothy R Draper, DO  ipratropium (ATROVENT) 0.06 % nasal spray Place 2 sprays into both nostrils 4 (four) times daily. 05/29/14   Linna HoffJames D Rylah Fukuda, MD  levonorgestrel (MIRENA) 20 MCG/24HR IUD 1 each by Intrauterine route once.    Historical Provider, MD  levothyroxine (SYNTHROID, LEVOTHROID) 150 MCG tablet Take 1 tablet (150 mcg total) by mouth daily before breakfast. 05/18/14   Romero BellingSean Ellison, MD  meloxicam (MOBIC) 15 MG tablet Take 1 tablet (15 mg total) by mouth daily. 09/09/13   Ralene Corkimothy R Draper, DO  Multiple Vitamins-Calcium (ONE-A-DAY WOMENS PO) Take 1 tablet by mouth daily.    Historical Provider, MD   BP 108/58  Pulse 66  Temp(Src) 98.2 F (36.8 C) (Oral)  Resp 16  SpO2 100% Physical Exam  Nursing note and vitals reviewed. Constitutional: She is oriented to person, place, and time. She appears well-developed and well-nourished. No distress.  HENT:  Right Ear: External ear normal.  Left Ear: External ear normal.  Nose: Mucosal edema and rhinorrhea present.  Mouth/Throat: Oropharynx is clear and moist.  Neck: Normal range of motion. Neck supple.  Cardiovascular: Normal heart sounds.   Pulmonary/Chest: Effort normal and breath sounds normal.  Lymphadenopathy:    She has no cervical adenopathy.  Neurological:  She is alert and oriented to person, place, and time.  Skin: Skin is warm and dry.    ED Course  Procedures (including critical care time) Labs Review Labs Reviewed  POCT RAPID STREP A ([facility] CARE ONLY)   Strep neg. Imaging Review No results found.   MDM   1. URI (upper respiratory infection)       Linna HoffJames D Halo Laski, MD 05/29/14 1425

## 2014-05-29 NOTE — ED Notes (Signed)
Pt  Reports      Symptoms  Of  sorethroat         Congested      Headache  And  A  Runny  Nose    With  Symptoms  X  2  Days  - she  Is  Sitting  Upright on  Exam table  Appearing  In no  Acute  Distress     Speaking in complete  sentances

## 2014-05-31 LAB — CULTURE, GROUP A STREP

## 2014-06-07 ENCOUNTER — Ambulatory Visit: Payer: Medicaid Other | Admitting: Endocrinology

## 2014-06-07 DIAGNOSIS — Z0289 Encounter for other administrative examinations: Secondary | ICD-10-CM

## 2014-06-21 ENCOUNTER — Encounter (HOSPITAL_COMMUNITY): Payer: Self-pay | Admitting: Emergency Medicine

## 2014-06-21 ENCOUNTER — Emergency Department (HOSPITAL_COMMUNITY)
Admission: EM | Admit: 2014-06-21 | Discharge: 2014-06-22 | Disposition: A | Discharge status: Home / Self Care | Payer: Medicaid Other | Attending: Emergency Medicine | Admitting: Emergency Medicine

## 2014-06-21 ENCOUNTER — Emergency Department (HOSPITAL_COMMUNITY): Payer: Medicaid Other

## 2014-06-21 DIAGNOSIS — M79609 Pain in unspecified limb: Secondary | ICD-10-CM | POA: Insufficient documentation

## 2014-06-21 DIAGNOSIS — E039 Hypothyroidism, unspecified: Secondary | ICD-10-CM | POA: Insufficient documentation

## 2014-06-21 DIAGNOSIS — M7981 Nontraumatic hematoma of soft tissue: Secondary | ICD-10-CM | POA: Insufficient documentation

## 2014-06-21 DIAGNOSIS — M25476 Effusion, unspecified foot: Secondary | ICD-10-CM | POA: Insufficient documentation

## 2014-06-21 DIAGNOSIS — M79671 Pain in right foot: Secondary | ICD-10-CM

## 2014-06-21 DIAGNOSIS — Z79899 Other long term (current) drug therapy: Secondary | ICD-10-CM | POA: Insufficient documentation

## 2014-06-21 DIAGNOSIS — M25473 Effusion, unspecified ankle: Secondary | ICD-10-CM | POA: Insufficient documentation

## 2014-06-21 DIAGNOSIS — Z87448 Personal history of other diseases of urinary system: Secondary | ICD-10-CM | POA: Insufficient documentation

## 2014-06-21 NOTE — ED Provider Notes (Signed)
CSN: [id number]     Arrival date & time 06/21/14  2102 History   First MD Initiated Contact with Patient 06/21/14 2231     This chart was scribed for non-physician practitioner, Kristy ForthHannah Chidiebere Wynn PA-C working with Kristy GivensIva L Knapp, MD by Kristy Wright, ED Scribe. This patient was seen in room TR10C/TR10C and the patient's care was started at 11:17 PM.   Chief Complaint  Patient presents with  . Foot Pain   Patient is a 23 y.o. female presenting with lower extremity pain. The history is provided by the patient and medical records. No language interpreter was used.  Foot Pain This is a new problem. The current episode started 3 to 5 hours ago. The problem occurs constantly. The problem has not changed since onset.Nothing aggravates the symptoms. Nothing relieves the symptoms. She has tried nothing for the symptoms.    HPI Comments: Kristy Wright is a 23 y.o. Female with a PMHx of hypothyroidism who presents to the Emergency Department complaining of constant, moderate pain to the lateral aspect of the R foot onset this afternoon around 3 PM that is unchaged. No recent injury or trauma. However, she admits to running daily. This pain is exacerbated with ambulation. No alleviating factors at this time. She has not tried anything OTC or any home remedies to help manage symptoms. At this time she denies any fever or chills. No numbness, loss of sensation, or weakness. Pt reports a fracture to the area of discomfort 2 years ago. No known allergies to medications. She has no other pertinent past medical history. No other concerns this visit.   Past Medical History  Diagnosis Date  . HYPOTHYROIDISM 03/13/2009  . Thyroid activity decreased   . Thyroid activity decreased   . Pyelonephritis    History reviewed. No pertinent past surgical history. Family History  Problem Relation Age of Onset  . Thyroid disease Sister     uncertain type  . Hypertension Sister   . Stroke Other     Grandmother-CVA  .  Cancer Mother     cervical  . Diabetes Maternal Grandmother   . Heart disease Maternal Grandmother   . Hypertension Maternal Grandmother   . Cancer Paternal Grandmother     breast  . Hypertension Paternal Grandmother   . Diabetes Paternal Grandfather    History  Substance Use Topics  . Smoking status: Never Smoker   . Smokeless tobacco: Never Used  . Alcohol Use: No   OB History   Grav Para Term Preterm Abortions TAB SAB Ect Mult Living   5 5 5       5      Review of Systems  Constitutional: Negative for fever and chills.  Gastrointestinal: Negative for nausea and vomiting.  Musculoskeletal: Positive for arthralgias (R foot) and joint swelling. Negative for back pain, neck pain and neck stiffness.  Skin: Negative for wound.  Neurological: Negative for weakness and numbness.  Hematological: Does not bruise/bleed easily.  Psychiatric/Behavioral: The patient is not nervous/anxious.   All other systems reviewed and are negative.     Allergies  Review of patient's allergies indicates no known allergies.  Home Medications   Prior to Admission medications   Medication Sig Start Date End Date Taking? Authorizing Provider  levothyroxine (SYNTHROID, LEVOTHROID) 150 MCG tablet Take 1 tablet (150 mcg total) by mouth daily before breakfast. 05/18/14  Yes Kristy BellingSean Ellison, MD  Multiple Vitamins-Calcium (ONE-A-DAY WOMENS PO) Take 1 tablet by mouth daily.   Yes Historical Provider, MD  levonorgestrel (MIRENA) 20 MCG/24HR IUD 1 each by Intrauterine route once.    Historical Provider, MD   Triage Vitals: BP 114/71  Pulse 83  Temp(Src) 98.6 F (37 C) (Oral)  Resp 20  Wt 170 lb 7 oz (77.31 kg)  SpO2 100%   Physical Exam  Nursing note and vitals reviewed. Constitutional: She appears well-developed and well-nourished. No distress.  HENT:  Head: Normocephalic and atraumatic.  Eyes: Conjunctivae are normal.  Neck: Normal range of motion.  Cardiovascular: Normal rate, regular rhythm and  intact distal pulses.   Capillary refill < 3 sec  Pulmonary/Chest: Effort normal and breath sounds normal.  Musculoskeletal: She exhibits tenderness. She exhibits no edema.  Swelling to lateral portion of R foot and base of 5th metatarsal and along plantar surface of foot Swelling and mild ecchymosis to dorsum of R foot Full range of motion of the right hip, right knee, right ankle and all toes on the right foot  Neurological: She is alert. Coordination normal.  Sensation intact to dull and sharp in the right lower extremity Strength 5/5 in the right lower extremity including dorsiflexion and plantar flexion Patient able to ambulate on the medial side of her foot but unable to weight-bear with foot flat  Skin: Skin is warm and dry. She is not diaphoretic.  No tenting of the skin  Psychiatric: She has a normal mood and affect.    ED Course  Procedures (including critical care time)  DIAGNOSTIC STUDIES: Oxygen Saturation is 100% on RA, Normal by my interpretation.    COORDINATION OF CARE: 11:17 PM-Discussed treatment plan with pt at bedside and pt agreed to plan.     Labs Review Labs Reviewed - No data to display  Imaging Review Dg Foot Complete Right  06/21/2014   CLINICAL DATA:  Right lateral foot pain and swelling since earlier today. No recent injury. History of previous fractures.  EXAM: RIGHT FOOT COMPLETE - 3+ VIEW  COMPARISON:  None.  FINDINGS: There is no evidence of fracture or dislocation. There is no evidence of arthropathy or other focal bone abnormality. Soft tissues are unremarkable.  IMPRESSION: Negative.   Electronically Signed   By: Burman Nieves M.D.   On: 06/21/2014 21:54     EKG Interpretation None      MDM   Final diagnoses:  Foot pain, right   Kristy Wright presents with pain to the lateral aspect of the right foot with associated swelling.  Patient X-Ray negative for obvious fracture or dislocation; however clinically I believe the patient has a  stress fracture of the fifth metatarsal. Patient without pain in her foot while at rest.  Patient given crutches and cam walker. Pt advised to follow up with orthopedics for further evaluation and treatment options. Patient given brace while in ED, conservative therapy recommended and discussed. Patient will be dc home & is agreeable with above plan.  BP 92/73  Pulse 68  Temp(Src) 98.6 F (37 C) (Oral)  Resp 20  Wt 170 lb 7 oz (77.31 kg)  SpO2 99%  I personally performed the services described in this documentation, which was scribed in my presence. The recorded information has been reviewed and is accurate.    Dahlia Client Brylea Pita, PA-C 06/22/14 0010

## 2014-06-21 NOTE — ED Notes (Signed)
Ortho tech paged  

## 2014-06-21 NOTE — Discharge Instructions (Signed)
1. Medications: ibuprofen for pain control, usual home medications 2. Treatment: rest, drink plenty of fluids, use boot and crutches 3. Follow Up: Please followup with orthopedics for further evaluation of your suspected stress fracture   Metatarsal Stress Fracture When too much stress is put on the foot, as in running and jumping sports, the center shaft of the bones of the forefoot is very susceptible to stress fractures (break in bone). This is because of repetitive stress on the bone. This injury is more common if osteoporosis is present or if inadequate running shoes are used. Rapid increase in running distances are often the cause. Running distances should be gradually increased to avoid this problem. Shoes should be used which adequately cushion the foot. Shoes should absorb the shocks of the activity.  DIAGNOSIS  Usually the diagnosis is made by history. The foot progressively becomes sorer with activities. X-rays may be negative (show no break) within the first 2 to 3 weeks of the beginning of pain. A later X-ray may show signs of healing bone (callus formation). A bone scan or MRI will usually make the diagnosis earlier. TREATMENT AND HOME CARE INSTRUCTIONS  Treatment may or may not include a cast, removable fracture boot, or walking shoe. Casts are used for short periods of time to prevent muscle atrophy (muscle wasting).  Activities should be stopped until further advised by your caregiver.  Wear shoes with adequate shock absorbing abilities.  Alternative exercise may be undertaken while waiting for healing. These may include bicycling and swimming, or as your caregiver suggests. If you do not have a cast or splint:  You may walk on your injured foot as tolerated or advised.  Do not put any weight on your injured foot for as long as directed by your caregiver. Slowly increase the amount of time you walk on the foot as the pain allows or as advised.  Use crutches until you can bear  weight without pain. A gradual increase in weight bearing may help.  Apply ice to the injury for 15-20 minutes each hour while awake for the first 2 days. Put the ice in a plastic bag and place a towel between the bag of ice and your skin.  Only take over-the-counter or prescription medicines for pain, discomfort, or fever as directed by your caregiver. SEEK IMMEDIATE MEDICAL CARE IF:   Pain is becoming worse rather than better, or if pain is uncontrolled with medications.  You have increased swelling or redness in the foot. MAKE SURE YOU:   Understand these instructions.  Will watch your condition.  Will get help right away if you are not doing well or get worse. Document Released: 11/08/2000 Document Revised: 02/03/2012 Document Reviewed: 09/06/2008 [facility] Patient Information 2015 [employer] [place]. This information is not intended to replace advice given to you by your health care provider. Make sure you discuss any questions you have with your health care provider.

## 2014-06-21 NOTE — ED Notes (Signed)
Pt. reports right foot pain onset this afternoon , denies injury or fall , ambulatory .

## 2014-06-21 NOTE — Progress Notes (Signed)
Orthopedic Tech Progress Note Patient Details:  Kristy CampiRebecca A Wright 1991/01/16 [id number]  Ortho Devices Type of Ortho Device: CAM walker;Crutches Ortho Device/Splint Interventions: Application   [name], [name] M 06/21/2014, 11:41 PM

## 2014-06-21 NOTE — ED Notes (Signed)
Patient transported to X-ray 

## 2014-06-21 NOTE — ED Notes (Signed)
Back from xray

## 2014-06-22 NOTE — ED Provider Notes (Signed)
Medical screening examination/treatment/procedure(s) were performed by non-physician practitioner and as supervising physician I was immediately available for consultation/collaboration.   EKG Interpretation None      Arihanna Estabrook, MD, FACEP   Kellon Chalk L Saylee Sherrill, MD 06/22/14 0026 

## 2014-08-12 ENCOUNTER — Telehealth: Payer: Self-pay | Admitting: Endocrinology

## 2014-08-12 MED ORDER — LEVOTHYROXINE SODIUM 150 MCG PO TABS: 150.0000 ug | ORAL_TABLET | Freq: Every day | ORAL | Status: DC

## 2014-08-12 NOTE — Telephone Encounter (Signed)
Patient would like her Levothyroxine called in to [facility]   She has a scheduled appointment to see Dr. Everardo All on 08/22/14   Thank you

## 2014-08-12 NOTE — Telephone Encounter (Signed)
Rx sent for a 30 day supply.  

## 2014-08-22 ENCOUNTER — Ambulatory Visit: Payer: Medicaid Other | Admitting: Endocrinology

## 2014-08-22 ENCOUNTER — Telehealth: Payer: Self-pay | Admitting: Endocrinology

## 2014-08-22 NOTE — Telephone Encounter (Signed)
Patient no showed today's appt. Please advise on how to follow up. °A. No follow up necessary. °B. Follow up urgent. Contact patient immediately. °C. Follow up necessary. Contact patient and schedule visit in ___ days. °D. Follow up advised. Contact patient and schedule visit in ____weeks. ° °

## 2014-08-22 NOTE — Telephone Encounter (Signed)
Follow up advised. Contact patient and schedule visit in 2 weeks. 

## 2014-08-23 NOTE — Telephone Encounter (Signed)
Called pt. She states she is waiting on Medicaid to come into effect. Pt did not want to make an appointment right now. Pt states she will call back once her medicaid is ready.

## 2014-09-09 ENCOUNTER — Other Ambulatory Visit: Payer: Self-pay | Admitting: Endocrinology

## 2014-09-09 NOTE — Telephone Encounter (Signed)
Please refill x 1 Ov is due  

## 2014-09-09 NOTE — Telephone Encounter (Signed)
Please advise if ok to refill pt has not been seen since 02/2013. Thanks!

## 2014-09-09 NOTE — Telephone Encounter (Signed)
Rx sent 

## 2014-09-26 ENCOUNTER — Encounter (HOSPITAL_COMMUNITY): Payer: Self-pay | Admitting: Emergency Medicine

## 2014-10-17 ENCOUNTER — Telehealth: Payer: Self-pay | Admitting: Endocrinology

## 2014-10-17 MED ORDER — LEVOTHYROXINE SODIUM 150 MCG PO TABS: ORAL_TABLET | ORAL | Status: DC

## 2014-10-17 NOTE — Telephone Encounter (Signed)
Patient need a refill of Levothyroxine 150 mg

## 2014-10-17 NOTE — Telephone Encounter (Signed)
Rx sent to pharmacy   

## 2015-01-14 IMAGING — CR DG THORACIC SPINE 2V
3 series · 3 of 3 positions shown · non-contrast
Comparison: None.

CLINICAL DATA: 22-year-old female with numbness and tingling.

EXAM:
THORACIC SPINE - 2 VIEW

[t t-spine a.p.]
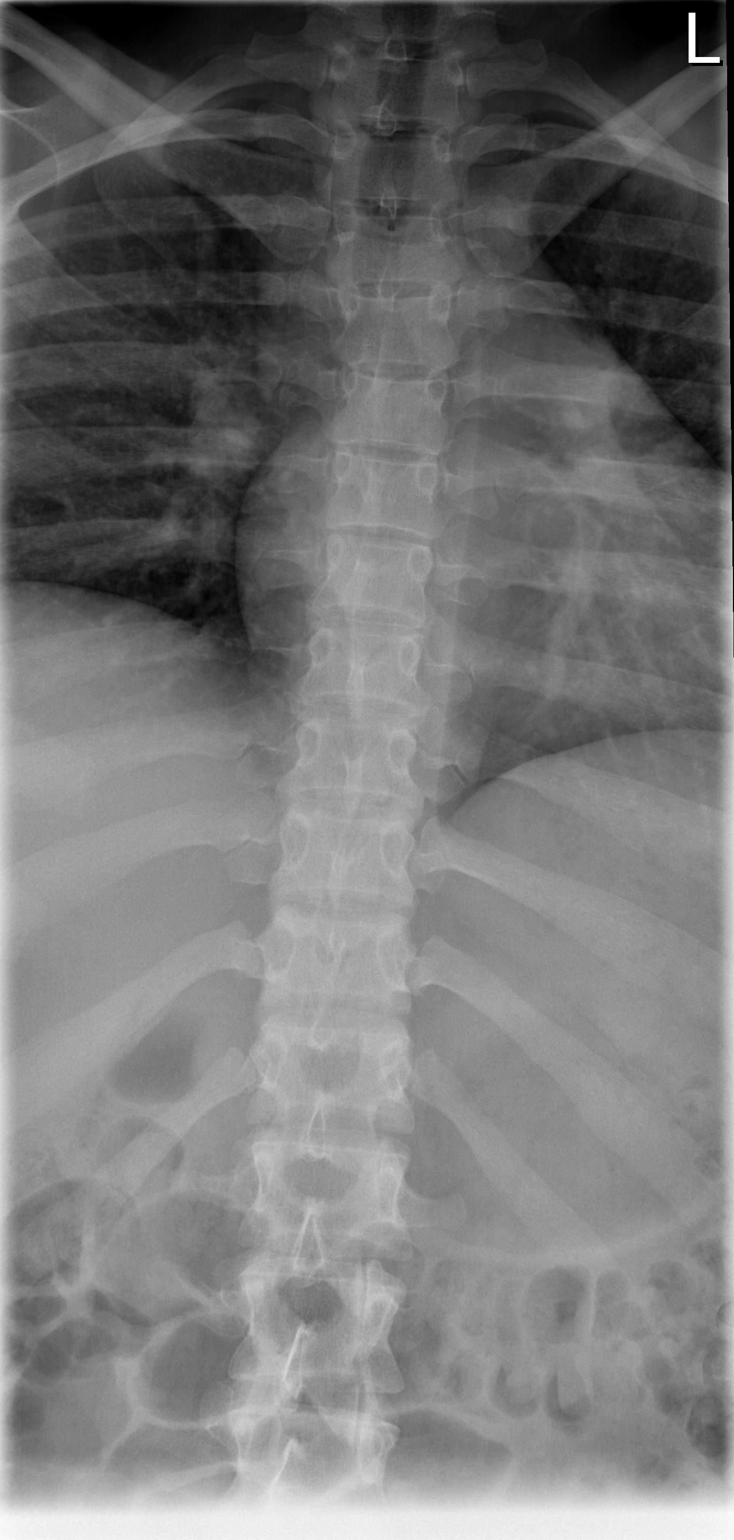

[t t-spine lat]
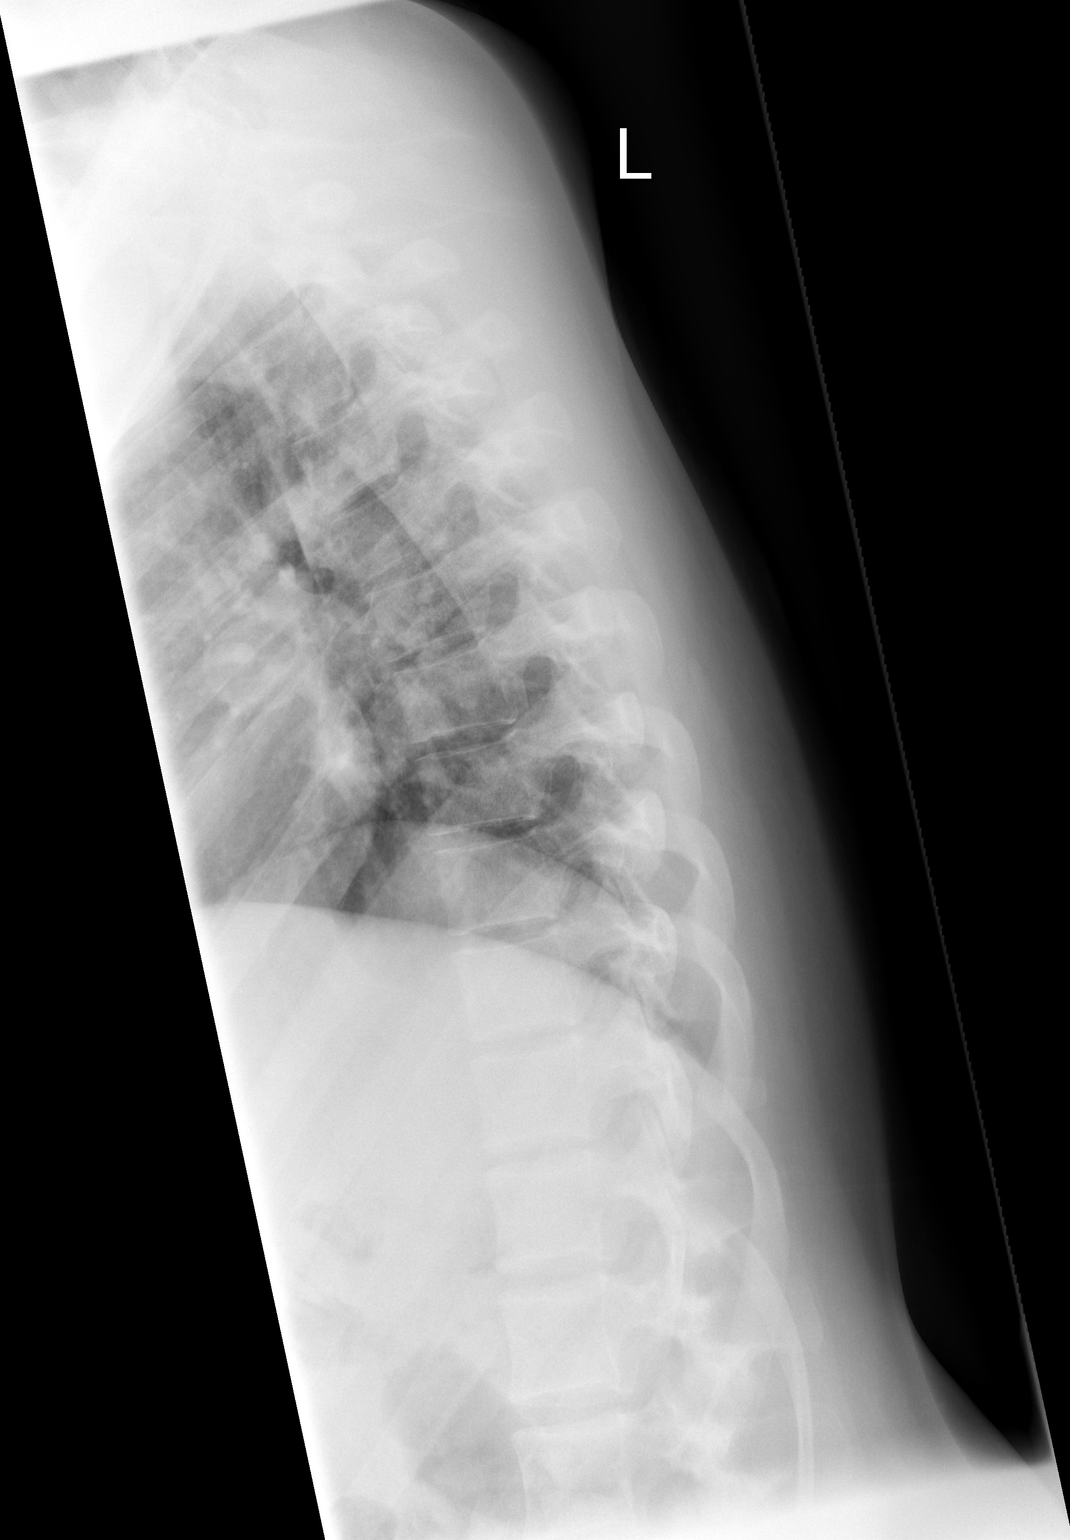

[t swimmers]
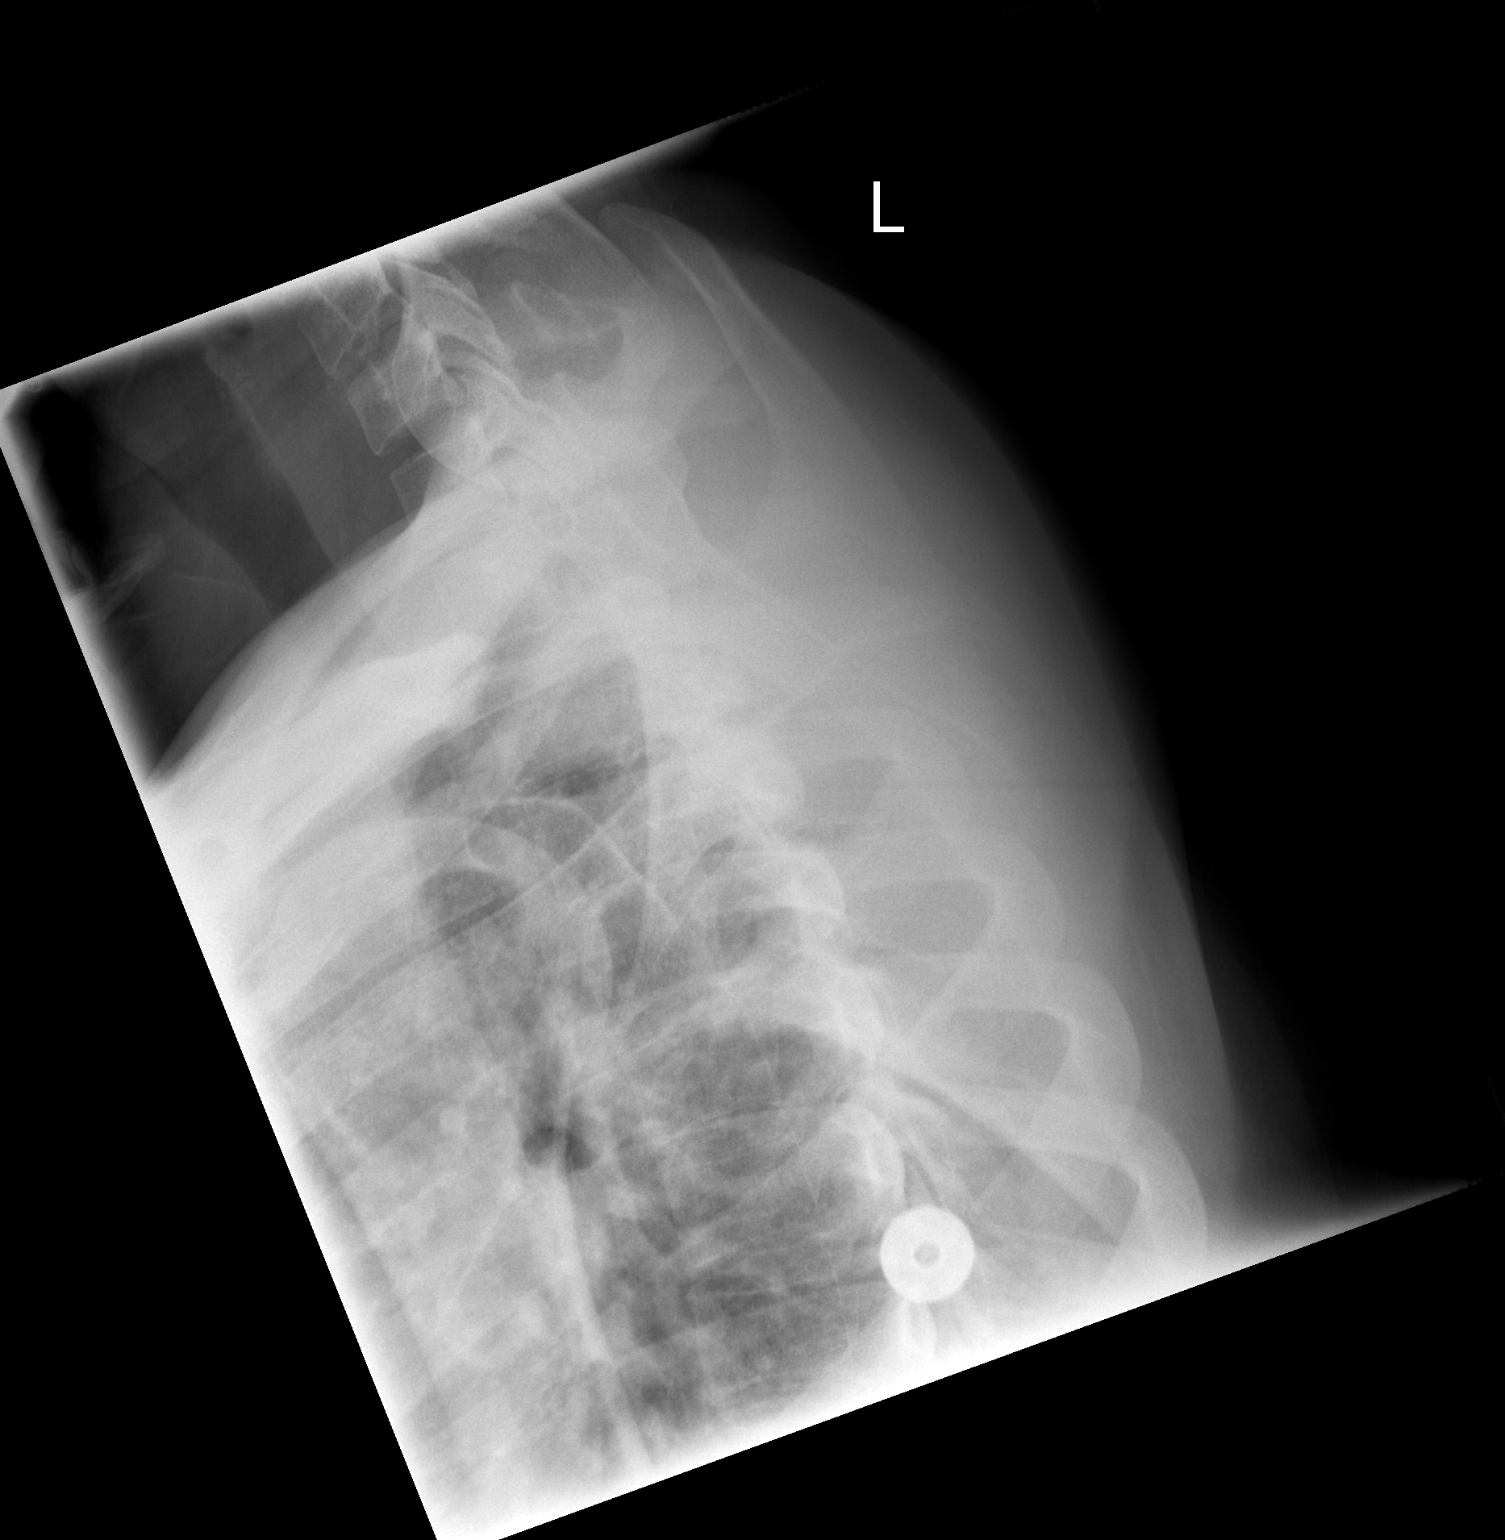

[3 of 3 positions shown; findings below may reference images not displayed]

FINDINGS: Bone mineralization is within normal limits. Normal thoracic
segmentation. Normal thoracic vertebral height and alignment.
Cervicothoracic junction alignment is within normal limits.
Preserved disc spaces. Grossly negative visualized thoracic visceral
contours.
IMPRESSION: Negative.

## 2015-01-14 IMAGING — CT CT HEAD W/O CM
2 series · 16 of 30 positions shown, 20 images · non-contrast
Comparison: None.

CLINICAL DATA: There back numbness for 2 months

CT HEAD WITHOUT CONTRAST
TECHNIQUE: Contiguous axial images were obtained from the base of
the skull through the vertex without contrast.

[Series 2: head w/o · axial · non-contrast · 0.49mm/px · z∈[+136,+256]mm · 13 of 30 slices shown, 17 images]
[im 3/30  brain]
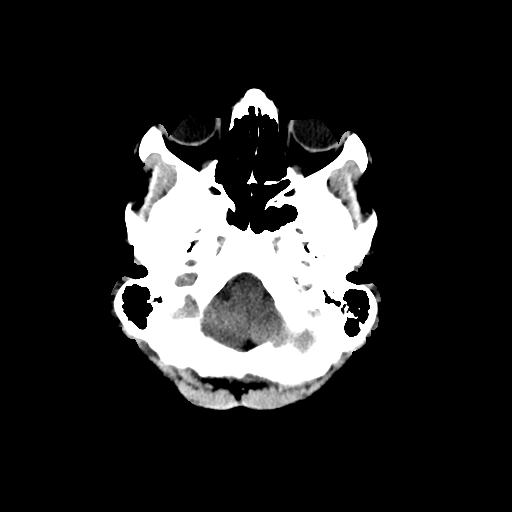
[im 3/30  bone]
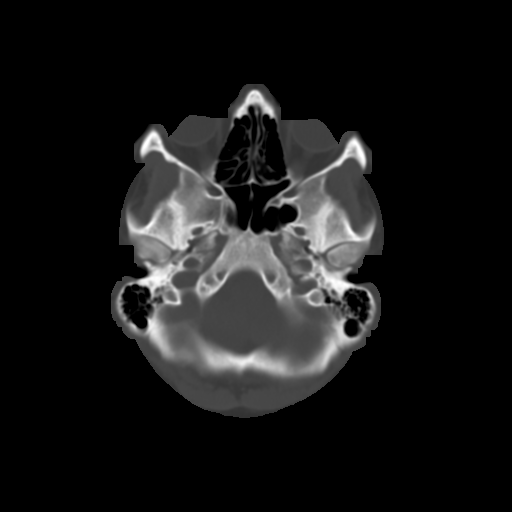
[im 5/30  brain]
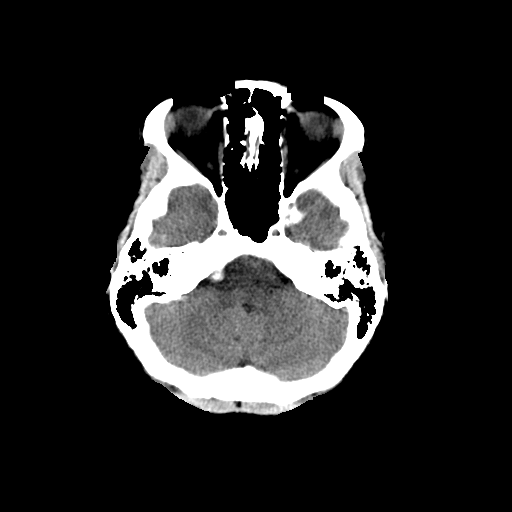
[im 7/30  brain]
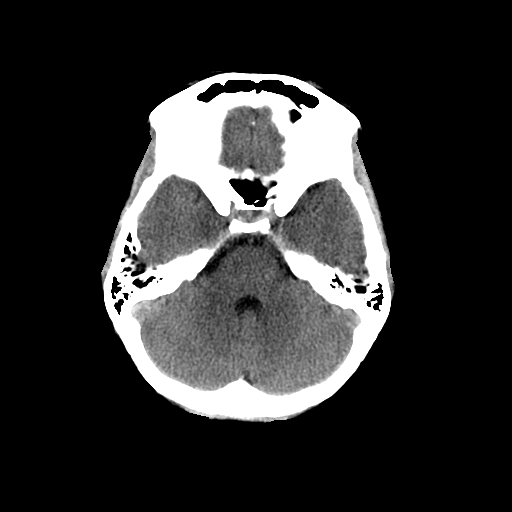
[im 9/30  brain]
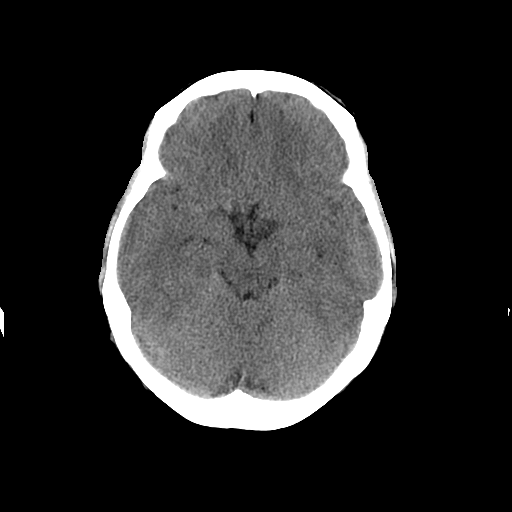
[im 11/30  brain]
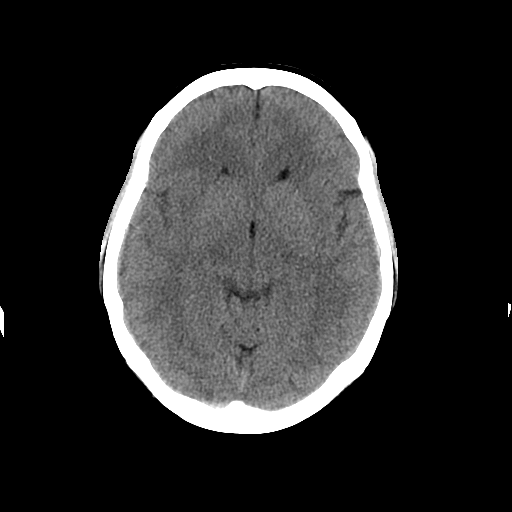
[im 11/30  bone]
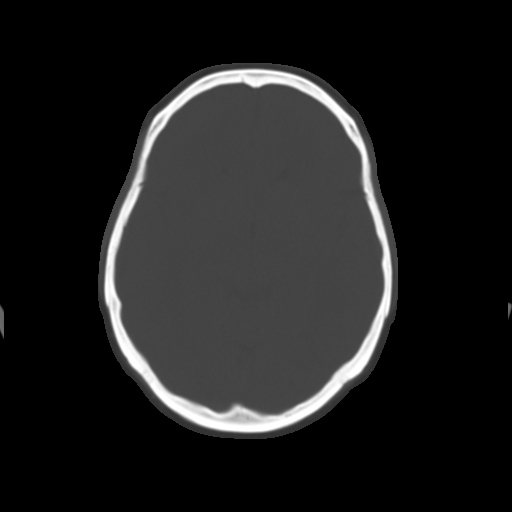
[im 13/30  brain]
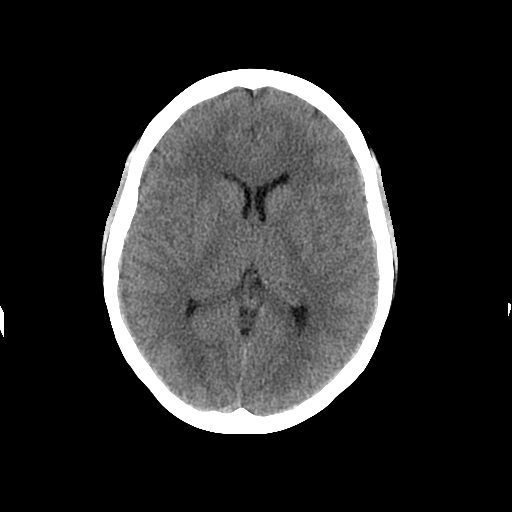
[im 15/30  brain]
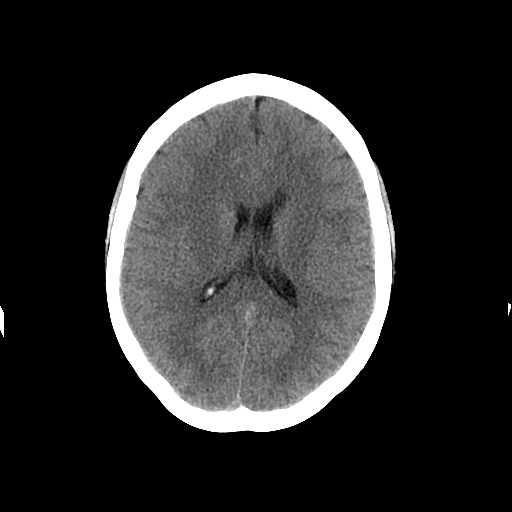
[im 17/30  brain]
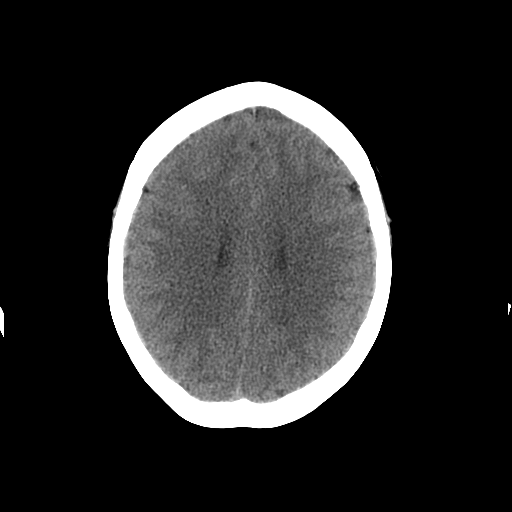
[im 19/30  brain]
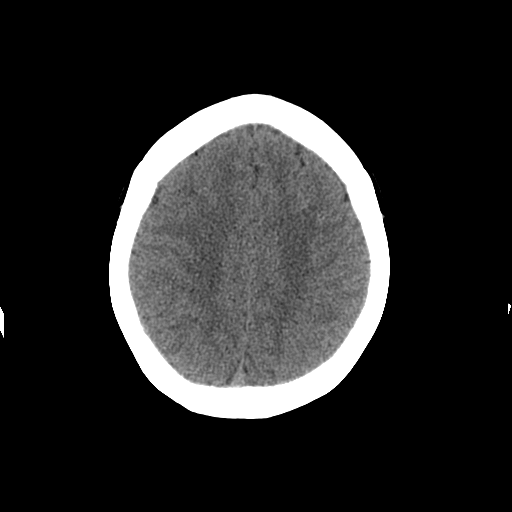
[im 19/30  bone]
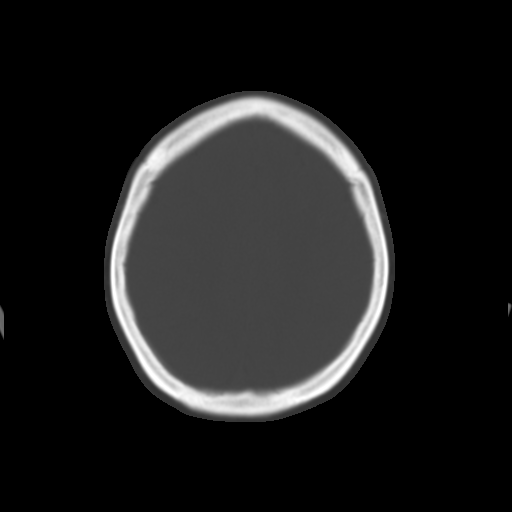
[im 21/30  brain]
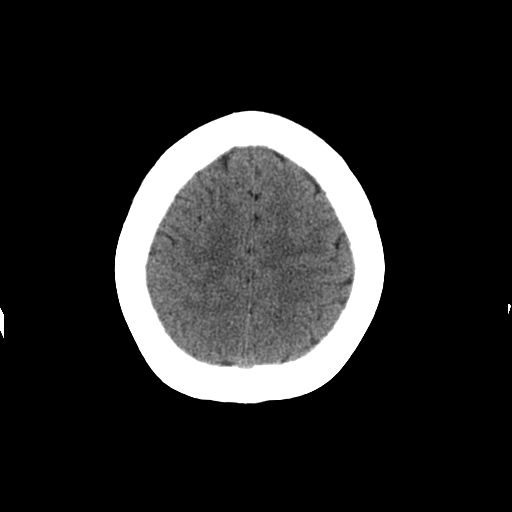
[im 23/30  brain]
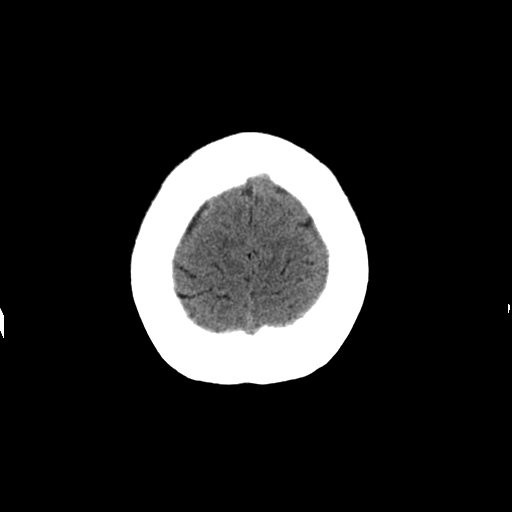
[im 25/30  brain]
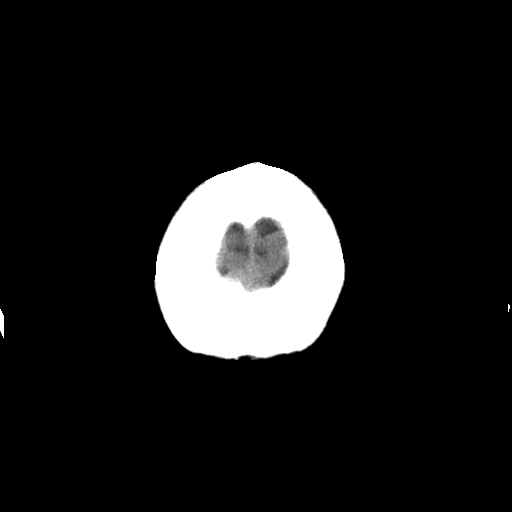
[im 27/30  brain]
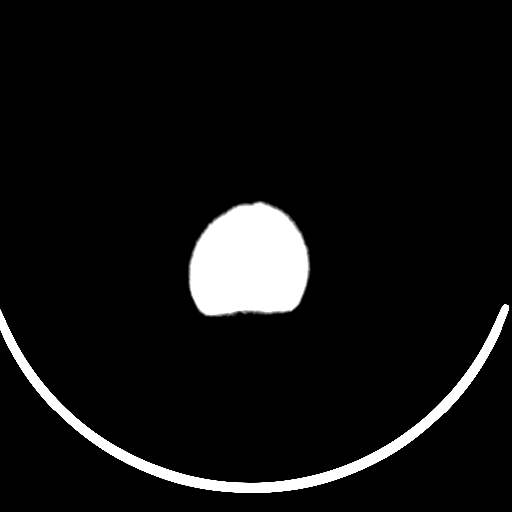
[im 27/30  bone]
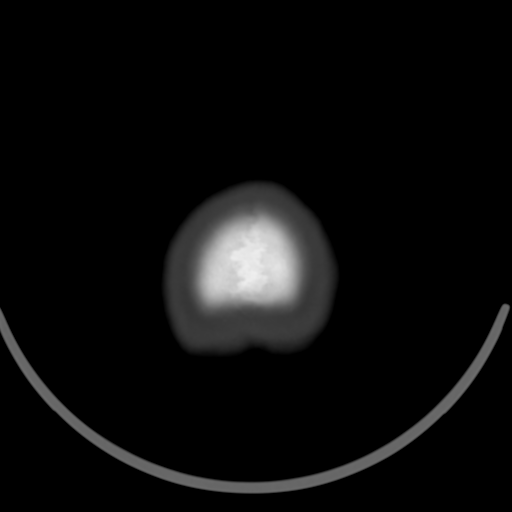

[Series 3: head w/o bone · axial · non-contrast · 0.49mm/px · z∈[+136,+176]mm · 3 of 30 slices shown]
[im 3/30  bone]
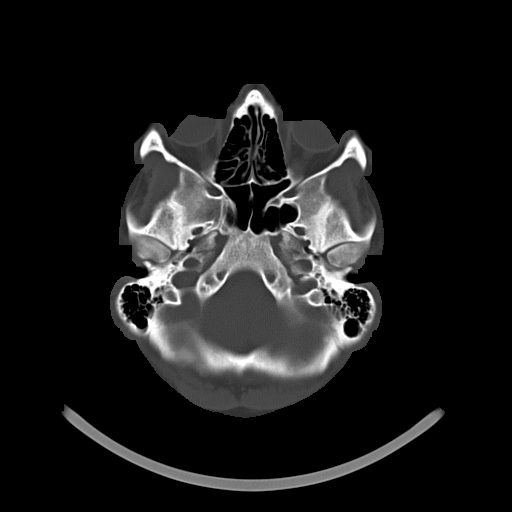
[im 7/30  bone]
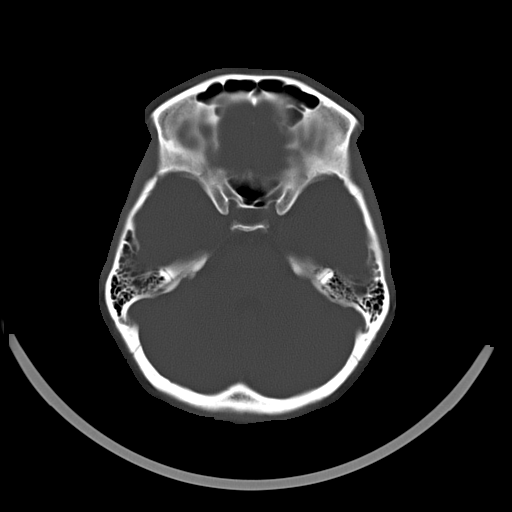
[im 11/30  bone]
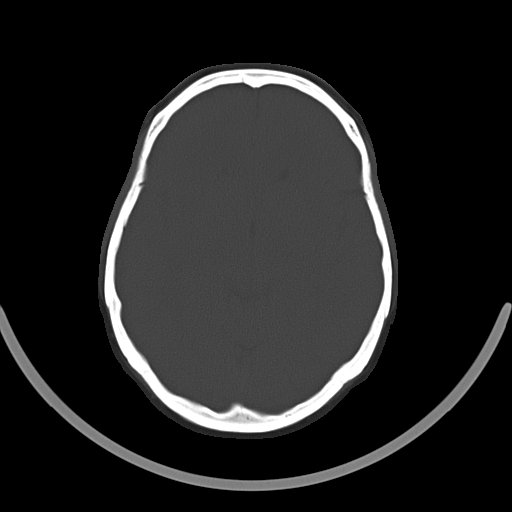

[16 of 30 positions shown; findings below may reference images not displayed]

FINDINGS: The calvarium is intact.  There is normal sulcation and
attenuation with no evidence of hemorrhage, extra-axial fluid,
infarct, mass, or hydrocephalus.
IMPRESSION: Negative

## 2015-02-06 ENCOUNTER — Other Ambulatory Visit: Payer: Self-pay | Admitting: Endocrinology

## 2015-02-06 ENCOUNTER — Telehealth: Payer: Self-pay | Admitting: *Deleted

## 2015-02-06 NOTE — Telephone Encounter (Signed)
[facility] Can we schedule [facility]? Please note that this is for another pt: Pollyann GlenAmanda Swaim.

## 2015-02-06 NOTE — Telephone Encounter (Signed)
My apologies..

## 2015-02-06 NOTE — Telephone Encounter (Signed)
Inocencio HomesGayle[facility] stating that they do not do bx of a lymph node. [facility] have [facility]. Be advised.

## 2015-03-07 ENCOUNTER — Telehealth: Payer: Self-pay | Admitting: Endocrinology

## 2015-03-07 NOTE — Telephone Encounter (Signed)
Please advise if ok to refill rx. Pt was last seen on 03/10/2013. Thanks!

## 2015-03-07 NOTE — Telephone Encounter (Signed)
Please refill x 1 Ov is due  

## 2015-03-07 NOTE — Telephone Encounter (Signed)
Patient need a [facility] [address]

## 2015-03-08 MED ORDER — LEVOTHYROXINE SODIUM 150 MCG PO TABS: ORAL_TABLET | ORAL | Status: DC

## 2015-03-08 NOTE — Telephone Encounter (Signed)
Rx sent to pharmacy   

## 2015-03-29 ENCOUNTER — Telehealth: Payer: Self-pay | Admitting: Endocrinology

## 2015-03-29 MED ORDER — LEVOTHYROXINE SODIUM 150 MCG PO TABS: ORAL_TABLET | ORAL | Status: DC

## 2015-03-29 NOTE — Telephone Encounter (Signed)
Pt needs a refill on levothyroxine please call pt back at [phone]

## 2015-03-29 NOTE — Telephone Encounter (Signed)
See note below. I contacted the patient and advised she needs a follow up office visit with Dr. Everardo AllEllison. The patient stated at this time she cannot schedule a appointment because she has no insurance and cannot afford the visit. Please advise if we can send the Rx. Last time patient was seen was April of 2014. Thanks!

## 2015-03-29 NOTE — Telephone Encounter (Signed)
Please refill x 1 Please advise pt we can't refill further without ov.

## 2015-03-29 NOTE — Telephone Encounter (Signed)
Left voicemail advising of note below. Requested call back from patient to schedule office visit.

## 2015-11-11 ENCOUNTER — Other Ambulatory Visit: Payer: Self-pay | Admitting: Endocrinology

## 2015-11-14 ENCOUNTER — Ambulatory Visit (INDEPENDENT_AMBULATORY_CARE_PROVIDER_SITE_OTHER): Payer: Managed Care, Other (non HMO) | Admitting: Endocrinology

## 2015-11-14 ENCOUNTER — Encounter: Payer: Self-pay | Admitting: Endocrinology

## 2015-11-14 VITALS — BP 127/78 | HR 77 | Temp 98.1°F | Ht 65.0 in | Wt 191.0 lb

## 2015-11-14 DIAGNOSIS — E042 Nontoxic multinodular goiter: Secondary | ICD-10-CM

## 2015-11-14 MED ORDER — LEVOTHYROXINE SODIUM 150 MCG PO TABS: 150.0000 ug | ORAL_TABLET | Freq: Every day | ORAL | Status: DC

## 2015-11-14 NOTE — Progress Notes (Signed)
Subjective:    Patient ID: Kristy Wright, female    DOB: 1991/01/12, 24 y.o.   MRN: [id number]  HPI Pt returns to f/u of chronic primary hypothyroidism (dx'ed 2009; US in 2014 showed enlarged and somewhat hypervascular thyroid, but no nodule).  She still has a slight "sensation" at the anterior neck, but no assoc pain.  She ran out of synthroid 1 week ago.  Past Medical History  Diagnosis Date  . HYPOTHYROIDISM 03/13/2009  . Thyroid activity decreased   . Thyroid activity decreased   . Pyelonephritis     No past surgical history on file.  Social History   Social History  . Marital Status: Single    Spouse Name: N/A  . Number of Children: 3  . Years of Education: N/A   Occupational History  . Not on file.   Social History Main Topics  . Smoking status: Never Smoker   . Smokeless tobacco: Never Used  . Alcohol Use: No  . Drug Use: No  . Sexual Activity: Yes    Birth Control/ Protection: None   Other Topics Concern  . Not on file   Social History Narrative   At age 24 began living with her older sister. States her mom was never really involved and she doesn't get along with her father. States they were "crazy parents". Doesn't know their history. She has 3 sons born in 2006, 2007, and 2009. Works at [employer]. Completed 10th grade but did not graduate from high school.    Current Outpatient Prescriptions on File Prior to Visit  Medication Sig Dispense Refill  . levonorgestrel (MIRENA) 20 MCG/24HR IUD 1 each by Intrauterine route once.    . Multiple Vitamins-Calcium (ONE-A-DAY WOMENS PO) Take 1 tablet by mouth daily. Reported on 11/14/2015    . [DISCONTINUED] norethindrone (CAMILA) 0.35 MG tablet Take 1 tablet by mouth daily.       No current facility-administered medications on file prior to visit.    No Known Allergies  Family History  Problem Relation Age of Onset  . Thyroid disease Sister     uncertain type  . Hypertension Sister   . Stroke Other    Grandmother-CVA  . Cancer Mother     cervical  . Diabetes Maternal Grandmother   . Heart disease Maternal Grandmother   . Hypertension Maternal Grandmother   . Cancer Paternal Grandmother     breast  . Hypertension Paternal Grandmother   . Diabetes Paternal Grandfather     BP 127/78 mmHg  Pulse 77  Temp(Src) 98.1 F (36.7 C) (Oral)  Ht 5\' 5"  (1.651 m)  Wt 191 lb (86.637 kg)  BMI 31.78 kg/m2  SpO2 98%  Review of Systems She has weight gain, excessive appetite, and hair loss.      Objective:   Physical Exam VITAL SIGNS:  See vs page GENERAL: no distress NECK: thyroid is slightly enlarged, with irreg surface.     Assessment & Plan:  Hypothyroidism.  Due for recheck Goiter: due for recheck, especially in view of pt's neck sxs.  Noncompliance with synthroid: she'll need to go back on synthroid before she can do a meaningful TSH  Patient is advised the following: Patient Instructions  Let's recheck the ultrasound.  you will receive a phone call, about a day and time for an appointment. i have sent a prescription to your pharmacy, to refill the thyroid medication.  Please recheck the blood test in 4-6 weeks.  Please return in 1 year.

## 2015-11-14 NOTE — Patient Instructions (Addendum)
Let's recheck the ultrasound.  you will receive a phone call, about a day and time for an appointment. i have sent a prescription to your pharmacy, to refill the thyroid medication.  Please recheck the blood test in 4-6 weeks.  Please return in 1 year.

## 2015-11-22 ENCOUNTER — Ambulatory Visit
Admission: RE | Admit: 2015-11-22 | Discharge: 2015-11-22 | Disposition: A | Discharge status: Home / Self Care | Payer: Managed Care, Other (non HMO) | Source: Ambulatory Visit | Attending: Endocrinology | Admitting: Endocrinology

## 2015-11-22 DIAGNOSIS — E042 Nontoxic multinodular goiter: Secondary | ICD-10-CM

## 2015-11-28 ENCOUNTER — Telehealth: Payer: Self-pay | Admitting: Endocrinology

## 2015-11-28 MED ORDER — LEVOTHYROXINE SODIUM 50 MCG PO TABS: 150.0000 ug | ORAL_TABLET | Freq: Every day | ORAL | Status: DC

## 2015-11-28 NOTE — Telephone Encounter (Signed)
Attempted to reach the pt. Will try again at a later time.  

## 2015-11-28 NOTE — Telephone Encounter (Signed)
Ok, i have sent a prescription to your pharmacy, for 3x50 mcg/day. Please come back for a follow-up appointment in 1 month.

## 2015-11-28 NOTE — Telephone Encounter (Signed)
Patient calling for the results of ultrasound. Patient stated that she is having a allergic reaction to medication levothyroxine, eyes are puffy and hands are swollen and feet [phone]

## 2015-11-28 NOTE — Telephone Encounter (Signed)
See note below and please advise, Thanks! 

## 2015-11-28 NOTE — Telephone Encounter (Signed)
Here is the my chart message i left about the US: No change--good. About the medication, we can change to 2x75 or 3x50 mcg pills per day. However, it is more likely that it is the amount of medication that would be causing the symptoms. How many have you missed in the past few weeks?

## 2015-11-28 NOTE — Telephone Encounter (Signed)
See note below and please advise on the medication reaction. Thanks!

## 2015-11-28 NOTE — Telephone Encounter (Signed)
Pt missed 7 doses

## 2015-11-29 NOTE — Telephone Encounter (Signed)
Attempted to reach the pt. Will try again at a later time.  

## 2015-11-29 NOTE — Telephone Encounter (Signed)
Attempted to reach the pt. Will call the pt back at a later time.

## 2016-01-12 ENCOUNTER — Emergency Department (HOSPITAL_COMMUNITY)
Admission: EM | Admit: 2016-01-12 | Discharge: 2016-01-12 | Disposition: A | Discharge status: Home / Self Care | Payer: Managed Care, Other (non HMO) | Attending: Emergency Medicine | Admitting: Emergency Medicine

## 2016-01-12 ENCOUNTER — Encounter (HOSPITAL_COMMUNITY): Payer: Self-pay | Admitting: Family Medicine

## 2016-01-12 ENCOUNTER — Emergency Department (HOSPITAL_COMMUNITY): Payer: Managed Care, Other (non HMO)

## 2016-01-12 DIAGNOSIS — Z79899 Other long term (current) drug therapy: Secondary | ICD-10-CM | POA: Diagnosis not present

## 2016-01-12 DIAGNOSIS — R69 Illness, unspecified: Secondary | ICD-10-CM

## 2016-01-12 DIAGNOSIS — R42 Dizziness and giddiness: Secondary | ICD-10-CM | POA: Diagnosis not present

## 2016-01-12 DIAGNOSIS — J111 Influenza due to unidentified influenza virus with other respiratory manifestations: Secondary | ICD-10-CM

## 2016-01-12 DIAGNOSIS — R05 Cough: Secondary | ICD-10-CM | POA: Diagnosis not present

## 2016-01-12 DIAGNOSIS — Z87448 Personal history of other diseases of urinary system: Secondary | ICD-10-CM | POA: Insufficient documentation

## 2016-01-12 DIAGNOSIS — R110 Nausea: Secondary | ICD-10-CM | POA: Diagnosis not present

## 2016-01-12 DIAGNOSIS — R630 Anorexia: Secondary | ICD-10-CM | POA: Diagnosis not present

## 2016-01-12 DIAGNOSIS — R51 Headache: Secondary | ICD-10-CM | POA: Insufficient documentation

## 2016-01-12 DIAGNOSIS — R509 Fever, unspecified: Secondary | ICD-10-CM | POA: Diagnosis not present

## 2016-01-12 DIAGNOSIS — E039 Hypothyroidism, unspecified: Secondary | ICD-10-CM | POA: Insufficient documentation

## 2016-01-12 LAB — COMPREHENSIVE METABOLIC PANEL
ALT: 28 U/L (ref 14–54)
AST: 36 U/L (ref 15–41)
Albumin: 3.9 g/dL (ref 3.5–5.0)
Alkaline Phosphatase: 60 U/L (ref 38–126)
Anion gap: 8 (ref 5–15)
BUN: 11 mg/dL (ref 6–20)
CHLORIDE: 105 mmol/L (ref 101–111)
CO2: 25 mmol/L (ref 22–32)
Calcium: 9 mg/dL (ref 8.9–10.3)
Creatinine, Ser: 0.72 mg/dL (ref 0.44–1.00)
GFR calc non Af Amer: >60 mL/min (ref >60)
Glucose, Bld: 90 mg/dL (ref 65–99)
POTASSIUM: 3.6 mmol/L (ref 3.5–5.1)
SODIUM: 138 mmol/L (ref 135–145)
TOTAL PROTEIN: 7.7 g/dL (ref 6.5–8.1)
Total Bilirubin: 0.9 mg/dL (ref 0.3–1.2)

## 2016-01-12 LAB — CBC WITH DIFFERENTIAL/PLATELET
Basophils Absolute: 0 10*3/uL (ref 0.0–0.1)
Basophils Relative: 0 %
EOS ABS: 0 10*3/uL (ref 0.0–0.7)
Eosinophils Relative: 0 %
HCT: 39 % (ref 36.0–46.0)
HEMOGLOBIN: 12.9 g/dL (ref 12.0–15.0)
LYMPHS ABS: 1.6 10*3/uL (ref 0.7–4.0)
LYMPHS PCT: 21 %
MCH: 29.3 pg (ref 26.0–34.0)
MCHC: 33.1 g/dL (ref 30.0–36.0)
MCV: 88.4 fL (ref 78.0–100.0)
Monocytes Absolute: 1.1 10*3/uL — ABNORMAL HIGH (ref 0.1–1.0)
Monocytes Relative: 14 %
NEUTROS PCT: 65 %
Neutro Abs: 4.9 10*3/uL (ref 1.7–7.7)
PLATELETS: 166 10*3/uL (ref 150–400)
RBC: 4.41 MIL/uL (ref 3.87–5.11)
RDW: 12.7 % (ref 11.5–15.5)
WBC: 7.6 10*3/uL (ref 4.0–10.5)

## 2016-01-12 MED ORDER — MORPHINE SULFATE (PF) 4 MG/ML IV SOLN
4.0000 mg | INTRAVENOUS | Status: DC | PRN
  Administered 2016-01-12: 4 mg via INTRAVENOUS
  Filled 2016-01-12: qty 1

## 2016-01-12 MED ORDER — KETOROLAC TROMETHAMINE 30 MG/ML IJ SOLN
30.0000 mg | Freq: Once | INTRAMUSCULAR | Status: AC
  Administered 2016-01-12: 30 mg via INTRAVENOUS
  Filled 2016-01-12: qty 1

## 2016-01-12 MED ORDER — ONDANSETRON HCL 4 MG PO TABS: 4.0000 mg | ORAL_TABLET | Freq: Three times a day (TID) | ORAL | Status: DC | PRN

## 2016-01-12 MED ORDER — ONDANSETRON HCL 4 MG/2ML IJ SOLN
4.0000 mg | Freq: Once | INTRAMUSCULAR | Status: AC
  Administered 2016-01-12: 4 mg via INTRAVENOUS
  Filled 2016-01-12: qty 2

## 2016-01-12 MED ORDER — SODIUM CHLORIDE 0.9 % IV BOLUS (SEPSIS)
1000.0000 mL | Freq: Once | INTRAVENOUS | Status: AC
  Administered 2016-01-12: 1000 mL via INTRAVENOUS

## 2016-01-12 MED ORDER — HYDROCODONE-ACETAMINOPHEN 5-325 MG PO TABS: ORAL_TABLET | ORAL | Status: DC

## 2016-01-12 NOTE — ED Notes (Signed)
Pt ambulates independently and with steady gait at time of discharge. Discharge instructions and follow up information reviewed with patient. No other questions or concerns voiced at this time. RX x 2 given. 

## 2016-01-12 NOTE — ED Notes (Signed)
Pt tolerating PO fluids

## 2016-01-12 NOTE — ED Notes (Signed)
Pt here for flu like symptoms. sts headache, fever, cough, vomiting, dizziness.

## 2016-01-12 NOTE — ED Provider Notes (Signed)
CSN: [id number]     Arrival date & time 01/12/16  1509 History  By signing my name below, I, Soijett Blue, attest that this documentation has been prepared under the direction and in the presence of Wynetta Emery, PA-C Electronically Signed: Soijett Blue, ED Scribe. 01/12/2016. 6:05 PM.   Chief Complaint  Patient presents with  . Generalized Body Aches  . Fever  . Cough    The history is provided by the patient. No language interpreter was used.    Kristy Wright is a 25 y.o. female who presents to the Emergency Department complaining of subjective fever onset 3 days ago. She notes that she didn't get the flu shot this past year. Pt denies sick contacts. Pt states that she has a 28 year old at this time. She states that she is having associated symptoms of HA, dry cough, generalized body aches, chills, dizziness upon standing, vomiting x 3 episodes today, appetite change, and decreased urine output. She states that she has tried advil with her last dose this morning for the relief for her symptoms. She denies CP, SOB, constipation, diarrhea, and any other symptoms.  Past Medical History  Diagnosis Date  . HYPOTHYROIDISM 03/13/2009  . Thyroid activity decreased   . Thyroid activity decreased   . Pyelonephritis    History reviewed. No pertinent past surgical history. Family History  Problem Relation Age of Onset  . Thyroid disease Sister     uncertain type  . Hypertension Sister   . Stroke Other     Grandmother-CVA  . Cancer Mother     cervical  . Diabetes Maternal Grandmother   . Heart disease Maternal Grandmother   . Hypertension Maternal Grandmother   . Cancer Paternal Grandmother     breast  . Hypertension Paternal Grandmother   . Diabetes Paternal Grandfather    Social History  Substance Use Topics  . Smoking status: Never Smoker   . Smokeless tobacco: Never Used  . Alcohol Use: No   OB History    Gravida Para Term Preterm AB TAB SAB Ectopic Multiple Living   Review of Systems  A complete 10 system review of systems was obtained and all systems are negative except as noted in the HPI and PMH.   Allergies  Review of patient's allergies indicates no known allergies.  Home Medications   Prior to Admission medications   Medication Sig Start Date End Date Taking? Authorizing Provider  levonorgestrel (MIRENA) 20 MCG/24HR IUD 1 each by Intrauterine route once.    Historical Provider, MD  levothyroxine (SYNTHROID, LEVOTHROID) 50 MCG tablet Take 3 tablets (150 mcg total) by mouth daily. 11/28/15   Romero Belling, MD  Multiple Vitamins-Calcium (ONE-A-DAY WOMENS PO) Take 1 tablet by mouth daily. Reported on 11/14/2015    Historical Provider, MD   BP 134/94 mmHg  Pulse 101  Temp(Src) 99 F (37.2 C) (Oral)  Resp 16  SpO2 99% Physical Exam  Constitutional: She is oriented to person, place, and time. She appears well-developed and well-nourished. No distress.  HENT:  Head: Normocephalic and atraumatic.  Right Ear: External ear normal.  Left Ear: External ear normal.  Mouth/Throat: Oropharynx is clear and moist. No oropharyngeal exudate.  No drooling or stridor. Posterior pharynx mildly erythematous no significant tonsillar hypertrophy. No exudate. Soft palate rises symmetrically. No TTP or induration under tongue.   No tenderness to palpation of frontal or bilateral maxillary sinuses.  Mild mucosal edema in the nares with scant rhinorrhea.  Bilateral tympanic membranes with normal architecture and good light reflex.    Eyes: Conjunctivae and EOM are normal. Pupils are equal, round, and reactive to light.  Neck: Normal range of motion. Neck supple.  FROM to C-spine. Pt can touch chin to chest without discomfort. No TTP of midline cervical spine.   Cardiovascular: Normal rate and regular rhythm.   Pulmonary/Chest: Effort normal and breath sounds normal. No stridor. No respiratory distress. She has no wheezes. She has no rales. She  exhibits no tenderness.  Abdominal: Soft. There is no tenderness. There is no rebound and no guarding.  Musculoskeletal: Normal range of motion.  Neurological: She is alert and oriented to person, place, and time.  Skin: Skin is warm and dry.  Psychiatric: She has a normal mood and affect. Her behavior is normal.  Nursing note and vitals reviewed.   ED Course  Procedures (including critical care time) DIAGNOSTIC STUDIES: Oxygen Saturation is 99% on RA, nl by my interpretation.    COORDINATION OF CARE: 6:03 PM Discussed treatment plan with pt at bedside which includes IV fluids, CXR, labs, UA, zofran, pain medication, and pt agreed to plan.    Labs Review Labs Reviewed  CBC WITH DIFFERENTIAL/PLATELET - Abnormal; Notable for the following:    Monocytes Absolute 1.1 (*)    All other components within normal limits  URINE CULTURE  COMPREHENSIVE METABOLIC PANEL  URINALYSIS, ROUTINE W REFLEX MICROSCOPIC (NOT AT [facility])    Imaging Review Dg Chest 2 View  01/12/2016  CLINICAL DATA:  Cough, weakness, fever EXAM: CHEST  2 VIEW COMPARISON:  None. FINDINGS: Normal heart size. Normal mediastinal contour. No pneumothorax. No pleural effusion. Lungs appear clear, with no acute consolidative airspace disease and no pulmonary edema. IMPRESSION: No active cardiopulmonary disease. Electronically Signed   By: Delbert Phenix M.D.   On: 01/12/2016 16:26   I have personally reviewed and evaluated these images and lab results as part of my medical decision-making.   EKG Interpretation None      MDM   Final diagnoses:  Influenza-like illness    Filed Vitals:   01/12/16 1556 01/12/16 1818  BP: 134/94 121/66  Pulse: 101 94  Temp: 99 F (37.2 C) 100.8 F (38.2 C)  TempSrc: Oral Oral  Resp: 16 16  SpO2: 99% 98%    Medications  morphine 4 MG/ML injection 4 mg (4 mg Intravenous Given 01/12/16 1821)  sodium chloride 0.9 % bolus 1,000 mL (0 mLs Intravenous Stopped 01/12/16 1925)  ondansetron  (ZOFRAN) injection 4 mg (4 mg Intravenous Given 01/12/16 1821)  ketorolac (TORADOL) 30 MG/ML injection 30 mg (30 mg Intravenous Given 01/12/16 1924)    Kristy Wright is 25 y.o. female presenting with tactile fever, dry cough, myalgia, headache, multiple episodes of emesis. Patient appears very uncomfortable however, nonseptic appearing. Patient with mild fever, mild tachycardia. Blood work reassuring, chest x-rays without infiltrate. Likely influenza. Patient with no comorbidities, pass the 48 hour window. Will be given Vicodin and Zofran after fluid bolus.  Patient reassessed and feels much better, she is tolerating by mouth fluids.  Evaluation does not show pathology that would require ongoing emergent intervention or inpatient treatment. Pt is hemodynamically stable and mentating appropriately. Discussed findings and plan with patient/guardian, who agrees with care plan. All questions answered. Return precautions discussed and outpatient follow up given.   New Prescriptions   HYDROCODONE-ACETAMINOPHEN (NORCO/VICODIN) 5-325 MG TABLET    Take 1-2 tablets by  mouth every 6 hours as needed for pain and/or cough.   ONDANSETRON (ZOFRAN) 4 MG TABLET    Take 1 tablet (4 mg total) by mouth every 8 (eight) hours as needed for nausea or vomiting.    I personally performed the services described in this documentation, which was scribed in my presence. The recorded information has been reviewed and is accurate.    Wynetta Emery, PA-C 01/12/16 2031  Linwood Dibbles, MD 01/13/16 [id number]

## 2016-01-12 NOTE — ED Notes (Signed)
Pt given Sprite and graham crackers

## 2016-01-12 NOTE — Discharge Instructions (Signed)
Return to the emergency room for any worsening or concerning symptoms including fast breathing, heart racing, confusion, vomiting. ° °Rest, cover your mouth when you cough and wash your hands frequently.  ° °Push fluids: water or Gatorade, do not drink any soda, juice or caffeinated beverages. ° °For fever and pain control you can take Motrin (ibuprofen) as follows: 400 mg (this is normally 2 over the counter pills) every 4 hours with food. ° °Do not return to work until a day after your fever breaks.  ° °Take Vicodin for cough and pain control, do not drink alcohol, drive, care for children or do other critical tasks while taking Vicodin    ° °Influenza, Adult °Influenza ("the flu") is a viral infection of the respiratory tract. It occurs more often in winter months because people spend more time in close contact with one another. Influenza can make you feel very sick. Influenza easily spreads from person to person (contagious). °CAUSES  °Influenza is caused by a virus that infects the respiratory tract. You can catch the virus by breathing in droplets from an infected person's cough or sneeze. You can also catch the virus by touching something that was recently contaminated with the virus and then touching your mouth, nose, or eyes. °RISKS AND COMPLICATIONS °You may be at risk for a more severe case of influenza if you smoke cigarettes, have diabetes, have chronic heart disease (such as heart failure) or lung disease (such as asthma), or if you have a weakened immune system. Elderly people and pregnant women are also at risk for more serious infections. The most common problem of influenza is a lung infection (pneumonia). Sometimes, this problem can require emergency medical care and may be life threatening. °SIGNS AND SYMPTOMS  °Symptoms typically last 4 to 10 days and may include: °· Fever. °· Chills. °· Headache, body aches, and muscle aches. °· Sore throat. °· Chest discomfort and cough. °· Poor  appetite. °· Weakness or feeling tired. °· Dizziness. °· Nausea or vomiting. °DIAGNOSIS  °Diagnosis of influenza is often made based on your history and a physical exam. A nose or throat swab test can be done to confirm the diagnosis. °TREATMENT  °In mild cases, influenza goes away on its own. Treatment is directed at relieving symptoms. For more severe cases, your health care provider may prescribe antiviral medicines to shorten the sickness. Antibiotic medicines are not effective because the infection is caused by a virus, not by bacteria. °HOME CARE INSTRUCTIONS °· Take medicines only as directed by your health care provider. °· Use a cool mist humidifier to make breathing easier. °· Get plenty of rest until your temperature returns to normal. This usually takes 3 to 4 days. °· Drink enough fluid to keep your urine clear or pale yellow. °· Cover your mouth and nose when coughing or sneezing, and wash your hands well to prevent the virus from spreading. °· Stay home from work or school until the fever is gone for at least 1 full day. °PREVENTION  °An annual influenza vaccination (flu shot) is the best way to avoid getting influenza. An annual flu shot is now routinely recommended for all adults in the [place] °SEEK MEDICAL CARE IF: °· You experience chest pain, your cough worsens, or you produce more mucus. °· You have nausea, vomiting, or diarrhea. °· Your fever returns or gets worse. °SEEK IMMEDIATE MEDICAL CARE IF: °· You have trouble breathing, you become short of breath, or your skin or nails become bluish. °· You have severe pain or stiffness in the neck. °· You develop a sudden headache, or pain in the face or ear. °· You have nausea or   vomiting that you cannot control. °MAKE SURE YOU:  °· Understand these instructions. °· Will watch your condition. °· Will get help right away if you are not doing well or get worse. °  °This information is not intended to replace advice given to you by your health care  provider. Make sure you discuss any questions you have with your health care provider. °  °Document Released: 11/08/2000 Document Revised: 12/02/2014 Document Reviewed: 02/10/2012 °Elsevier Interactive Patient Education ©2016 [employer] ° °

## 2016-01-15 ENCOUNTER — Ambulatory Visit: Payer: Managed Care, Other (non HMO) | Admitting: Endocrinology

## 2016-01-16 ENCOUNTER — Telehealth: Payer: Self-pay | Admitting: Endocrinology

## 2016-01-16 NOTE — Telephone Encounter (Signed)
Patient no showed today's appt. Please advise on how to follow up. °A. No follow up necessary. °B. Follow up urgent. Contact patient immediately. °C. Follow up necessary. Contact patient and schedule visit in ___ days. °D. Follow up advised. Contact patient and schedule visit in ____weeks. ° °

## 2016-01-16 NOTE — Telephone Encounter (Signed)
No follow up necessary.  

## 2016-02-22 ENCOUNTER — Other Ambulatory Visit: Payer: Self-pay

## 2016-02-22 MED ORDER — LEVOTHYROXINE SODIUM 50 MCG PO TABS: 150.0000 ug | ORAL_TABLET | Freq: Every day | ORAL | Status: DC

## 2016-03-27 ENCOUNTER — Emergency Department (HOSPITAL_COMMUNITY)
Admission: EM | Admit: 2016-03-27 | Discharge: 2016-03-27 | Disposition: A | Discharge status: Home / Self Care | Payer: Managed Care, Other (non HMO) | Attending: Emergency Medicine | Admitting: Emergency Medicine

## 2016-03-27 ENCOUNTER — Encounter (HOSPITAL_COMMUNITY): Payer: Self-pay | Admitting: Emergency Medicine

## 2016-03-27 DIAGNOSIS — J020 Streptococcal pharyngitis: Secondary | ICD-10-CM

## 2016-03-27 DIAGNOSIS — Z87448 Personal history of other diseases of urinary system: Secondary | ICD-10-CM | POA: Insufficient documentation

## 2016-03-27 DIAGNOSIS — Z79899 Other long term (current) drug therapy: Secondary | ICD-10-CM | POA: Diagnosis not present

## 2016-03-27 DIAGNOSIS — E039 Hypothyroidism, unspecified: Secondary | ICD-10-CM | POA: Insufficient documentation

## 2016-03-27 DIAGNOSIS — J029 Acute pharyngitis, unspecified: Secondary | ICD-10-CM | POA: Diagnosis present

## 2016-03-27 LAB — RAPID STREP SCREEN (MED CTR MEBANE ONLY): Streptococcus, Group A Screen (Direct): POSITIVE — AB

## 2016-03-27 MED ORDER — PENICILLIN G BENZATHINE 1200000 UNIT/2ML IM SUSP
1.2000 10*6.[IU] | Freq: Once | INTRAMUSCULAR | Status: AC
  Administered 2016-03-27: 1.2 10*6.[IU] via INTRAMUSCULAR
  Filled 2016-03-27: qty 2

## 2016-03-27 MED ORDER — DEXAMETHASONE SODIUM PHOSPHATE 10 MG/ML IJ SOLN
10.0000 mg | Freq: Once | INTRAMUSCULAR | Status: AC
  Administered 2016-03-27: 10 mg via INTRAMUSCULAR
  Filled 2016-03-27: qty 1

## 2016-03-27 MED ORDER — NAPROXEN 500 MG PO TABS: 500.0000 mg | ORAL_TABLET | Freq: Two times a day (BID) | ORAL | Status: DC

## 2016-03-27 NOTE — ED Notes (Signed)
Pt c/o sore throat on the R side, pt states "I cant eat nothing". Pt in NAD, respirations e/u.

## 2016-03-27 NOTE — Discharge Instructions (Signed)
Please read and follow all provided instructions.  Your diagnoses today include:  1. Streptococcal pharyngitis    Tests performed today include:  Strep test: was POSITIVE for strep throat  Vital signs. See below for your results today.   Medications prescribed:  You were given a one-time shot of penicillin to treat your strep throat.   Take any medications prescribed only as directed.   Home care instructions:  Please read the educational materials provided and follow any instructions contained in this packet.  Follow-up instructions: Please follow-up with your primary care provider as needed for further evaluation of your symptoms.  Return instructions:   Please return to the Emergency Department if you experience worsening symptoms.   Return if you have worsening problems swallowing, your neck becomes swollen, you cannot swallow your saliva or your voice becomes muffled.   Return with high persistent fever, persistent vomiting, or if you have trouble breathing.   Please return if you have any other emergent concerns.  Additional Information:  Your vital signs today were: BP 114/76 mmHg   Pulse 83   Temp(Src) 98.9 F (37.2 C)   Resp 20   Ht 5\' 4"  (1.626 m)   Wt 81.194 kg   BMI 30.71 kg/m2   SpO2 100% If your blood pressure (BP) was elevated above 135/85 this visit, please have this repeated by your doctor within one month. --------------

## 2016-03-27 NOTE — ED Notes (Signed)
Patient states her lips are numb.   Asked to speak with PA before she is discharged.

## 2016-03-27 NOTE — ED Provider Notes (Signed)
CSN: [id number]     Arrival date & time 03/27/16  1111 History  By signing my name below, I, Sonum Patel, attest that this documentation has been prepared under the direction and in the presence of [employer]. Electronically Signed: Sonum Patel, Neurosurgeon. 03/27/2016. 11:30 AM.    Chief Complaint  Patient presents with  . Sore Throat    The history is provided by the patient. No language interpreter was used.     HPI Comments: Kristy Wright is a 25 y.o. female who presents to the Emergency Department complaining of 1 day of gradual onset, constant, gradually worsened sore throat with associated subjective fever and chills. She reports a history of frequent strep throat. She denies known sick contacts. She denies any other complaints at this time.   Past Medical History  Diagnosis Date  . HYPOTHYROIDISM 03/13/2009  . Thyroid activity decreased   . Thyroid activity decreased   . Pyelonephritis    History reviewed. No pertinent past surgical history. Family History  Problem Relation Age of Onset  . Thyroid disease Sister     uncertain type  . Hypertension Sister   . Stroke Other     Grandmother-CVA  . Cancer Mother     cervical  . Diabetes Maternal Grandmother   . Heart disease Maternal Grandmother   . Hypertension Maternal Grandmother   . Cancer Paternal Grandmother     breast  . Hypertension Paternal Grandmother   . Diabetes Paternal Grandfather    Social History  Substance Use Topics  . Smoking status: Never Smoker   . Smokeless tobacco: Never Used  . Alcohol Use: No   OB History    Gravida Para Term Preterm AB TAB SAB Ectopic Multiple Living   Review of Systems  Constitutional: Positive for fever (subjective) and chills.  HENT: Positive for sore throat. Negative for trouble swallowing.   Respiratory: Negative for cough and shortness of breath.     Allergies  Review of patient's allergies indicates no known allergies.  Home Medications    Prior to Admission medications   Medication Sig Start Date End Date Taking? Authorizing Provider  HYDROcodone-acetaminophen (NORCO/VICODIN) 5-325 MG tablet Take 1-2 tablets by mouth every 6 hours as needed for pain and/or cough. 01/12/16   Nicole Pisciotta, PA-C  levonorgestrel (MIRENA) 20 MCG/24HR IUD 1 each by Intrauterine route once.    Historical Provider, MD  levothyroxine (SYNTHROID, LEVOTHROID) 50 MCG tablet Take 3 tablets (150 mcg total) by mouth daily. 02/22/16   Romero Belling, MD  Multiple Vitamins-Calcium (ONE-A-DAY WOMENS PO) Take 1 tablet by mouth daily. Reported on 11/14/2015    Historical Provider, MD  ondansetron (ZOFRAN) 4 MG tablet Take 1 tablet (4 mg total) by mouth every 8 (eight) hours as needed for nausea or vomiting. 01/12/16   Nicole Pisciotta, PA-C   BP 114/76 mmHg  Pulse 83  Temp(Src) 98.9 F (37.2 C)  Resp 20  Ht  (1.626 m)  Wt 179 lb (81.194 kg)  BMI 30.71 kg/m2  SpO2 100%   Physical Exam  Constitutional: She appears well-developed and well-nourished.  HENT:  Head: Normocephalic and atraumatic.  Right Ear: Tympanic membrane, external ear and ear canal normal.  Left Ear: Tympanic membrane, external ear and ear canal normal.  Nose: No mucosal edema or rhinorrhea.  Mouth/Throat: Posterior oropharyngeal edema and posterior oropharyngeal erythema present. No oropharyngeal exudate or tonsillar abscesses.  Eyes: Pupils  are equal, round, and reactive to light.  Neck: Normal range of motion. Neck supple.  Cardiovascular: Normal rate.  Exam reveals no decreased pulses.   Pulmonary/Chest: Effort normal.  Musculoskeletal: She exhibits tenderness. She exhibits no edema.  Lymphadenopathy:    She has cervical adenopathy.  Neurological: She is alert. No sensory deficit.  Motor, sensation, and vascular distal to the injury is fully intact.   Skin: Skin is warm and dry.  Psychiatric: She has a normal mood and affect.  Nursing note and vitals reviewed.   ED  Course  Procedures (including critical care time)  DIAGNOSTIC STUDIES: Oxygen Saturation is 100% on RA, normal by my interpretation.    COORDINATION OF CARE: 11:51 AM Will order strep screen and administer Bicillin and Decadron. Discussed treatment plan with pt at bedside and pt agreed to plan.    Labs Review Labs Reviewed  RAPID STRE[facility] SCREEN (NOT [facility]) - Abnormal; Notable for the following:    Streptococcus, Group A Screen (Direct) POSITIVE (*)    All other components within normal limits     Vital signs reviewed and are as follows: Filed Vitals:   03/27/16 1117 03/27/16 1230  BP: 114/76 101/62  Pulse: 83 73  Temp: 98.9 F (37.2 C) 98.3 F (36.8 C)  Resp: 20 20   12:44 PM patient updated on positive strep results. IM Decadron and Bicillin given. Patient encouraged to rest, follow-up with PCP as needed. Return to the emergency department with worsening difficulty breathing, high persistent fever or vomiting.  MDM   Final diagnoses:  Streptococcal pharyngitis   Patient with Streptococcal pharyngitis.  I personally performed the services described in this documentation, which was scribed in my presence. The recorded information has been reviewed and is accurate.   Renne CriglerJoshua Kensly Bowmer, PA-C 03/27/16 1244  Benjiman CoreNathan Pickering, MD 03/29/16 1900

## 2016-04-23 ENCOUNTER — Emergency Department (HOSPITAL_COMMUNITY): Payer: Managed Care, Other (non HMO)

## 2016-04-23 ENCOUNTER — Encounter (HOSPITAL_COMMUNITY): Payer: Self-pay | Admitting: Emergency Medicine

## 2016-04-23 ENCOUNTER — Emergency Department (HOSPITAL_COMMUNITY)
Admission: EM | Admit: 2016-04-23 | Discharge: 2016-04-23 | Disposition: A | Discharge status: Home / Self Care | Payer: Managed Care, Other (non HMO) | Attending: Emergency Medicine | Admitting: Emergency Medicine

## 2016-04-23 DIAGNOSIS — Y998 Other external cause status: Secondary | ICD-10-CM | POA: Diagnosis not present

## 2016-04-23 DIAGNOSIS — Y9289 Other specified places as the place of occurrence of the external cause: Secondary | ICD-10-CM | POA: Insufficient documentation

## 2016-04-23 DIAGNOSIS — Z79899 Other long term (current) drug therapy: Secondary | ICD-10-CM | POA: Diagnosis not present

## 2016-04-23 DIAGNOSIS — Z791 Long term (current) use of non-steroidal anti-inflammatories (NSAID): Secondary | ICD-10-CM | POA: Diagnosis not present

## 2016-04-23 DIAGNOSIS — Z87448 Personal history of other diseases of urinary system: Secondary | ICD-10-CM | POA: Insufficient documentation

## 2016-04-23 DIAGNOSIS — E039 Hypothyroidism, unspecified: Secondary | ICD-10-CM | POA: Diagnosis not present

## 2016-04-23 DIAGNOSIS — W500XXA Accidental hit or strike by another person, initial encounter: Secondary | ICD-10-CM | POA: Insufficient documentation

## 2016-04-23 DIAGNOSIS — Y9389 Activity, other specified: Secondary | ICD-10-CM | POA: Diagnosis not present

## 2016-04-23 DIAGNOSIS — M79671 Pain in right foot: Secondary | ICD-10-CM

## 2016-04-23 DIAGNOSIS — S99921A Unspecified injury of right foot, initial encounter: Secondary | ICD-10-CM | POA: Diagnosis present

## 2016-04-23 NOTE — ED Provider Notes (Signed)
CSN: [id number]     Arrival date & time 04/23/16  4098 History  By signing my name below, I, Kristy Wright, attest that this documentation has been prepared under the direction and in the presence of [employer], PA-C.  Electronically Signed: Tanda Wright, ED Scribe. 04/23/2016. 10:07 AM.   Chief Complaint  Patient presents with  . Foot Pain   The history is provided by the patient. No language interpreter was used.    HPI Comments: Kristy Wright is a 25 y.o. female who presents to the Emergency Department complaining of sudden onset, constant, right foot pain that began 3 days ago. Pt reports that another person wearing heels stepped onto patients foot, causing the pain. She has an abrasion and bruising to the foot from the heel. She also complains of mild swelling to the area as well as a numb/tingling sensation to the 3rd-4th toes. The pain is exacerbated with bearing weight onto the foot. Pt is able to ambulate but notes difficulty due to the pain. She has not taken any medications for the pain but has elevated the foot with mild relief from the swelling. Denies weakness or any other associated symptoms.   Past Medical History  Diagnosis Date  . HYPOTHYROIDISM 03/13/2009  . Thyroid activity decreased   . Thyroid activity decreased   . Pyelonephritis    History reviewed. No pertinent past surgical history. Family History  Problem Relation Age of Onset  . Thyroid disease Sister     uncertain type  . Hypertension Sister   . Stroke Other     Grandmother-CVA  . Cancer Mother     cervical  . Diabetes Maternal Grandmother   . Heart disease Maternal Grandmother   . Hypertension Maternal Grandmother   . Cancer Paternal Grandmother     breast  . Hypertension Paternal Grandmother   . Diabetes Paternal Grandfather    Social History  Substance Use Topics  . Smoking status: Never Smoker   . Smokeless tobacco: Never Used  . Alcohol Use: No   OB History    Gravida Para Term  Preterm AB TAB SAB Ectopic Multiple Living   Review of Systems  Musculoskeletal: Positive for joint swelling and arthralgias.  Skin: Positive for color change and wound.  Neurological: Positive for numbness. Negative for weakness.   Allergies  Review of patient's allergies indicates no known allergies.  Home Medications   Prior to Admission medications   Medication Sig Start Date End Date Taking? Authorizing Provider  HYDROcodone-acetaminophen (NORCO/VICODIN) 5-325 MG tablet Take 1-2 tablets by mouth every 6 hours as needed for pain and/or cough. 01/12/16   Nicole Pisciotta, PA-C  levonorgestrel (MIRENA) 20 MCG/24HR IUD 1 each by Intrauterine route once.    Historical Provider, MD  levothyroxine (SYNTHROID, LEVOTHROID) 50 MCG tablet Take 3 tablets (150 mcg total) by mouth daily. 02/22/16   Romero Belling, MD  Multiple Vitamins-Calcium (ONE-A-DAY WOMENS PO) Take 1 tablet by mouth daily. Reported on 11/14/2015    Historical Provider, MD  naproxen (NAPROSYN) 500 MG tablet Take 1 tablet (500 mg total) by mouth 2 (two) times daily. 03/27/16   Renne Crigler, PA-C  ondansetron (ZOFRAN) 4 MG tablet Take 1 tablet (4 mg total) by mouth every 8 (eight) hours as needed for nausea or vomiting. 01/12/16   Nicole Pisciotta, PA-C   BP 114/72 mmHg  Pulse 101  Temp(Src) 98.3 F (36.8 C) (Oral)  Resp  18  Ht 5\' 4"  (1.626 m)  Wt 180 lb (81.647 kg)  BMI 30.88 kg/m2  SpO2 100%   Physical Exam  Constitutional: She is oriented to person, place, and time. She appears well-developed and well-nourished. No distress.  HENT:  Head: Normocephalic and atraumatic.  Eyes: Conjunctivae are normal. Pupils are equal, round, and reactive to light. Right eye exhibits no discharge. Left eye exhibits no discharge. No scleral icterus.  Neck: Normal range of motion.  Pulmonary/Chest: Effort normal.  Musculoskeletal:  Right foot: Abrasion over dorsum of foot which is healing. No obvious swelling or deformity  when compared to L foot. FROM of ankle and toes. Tenderness to palpation of 3rd,4th, and 5th does as well as over dorsum of foot. N/V intact. Gait is antalgic   Neurological: She is alert and oriented to person, place, and time.  Skin: Skin is warm and dry.  Psychiatric: She has a normal mood and affect.    ED Course  Procedures (including critical care time)  DIAGNOSTIC STUDIES: Oxygen Saturation is 100% on RA, normal by my interpretation.    COORDINATION OF CARE: 10:06 AM-Discussed treatment plan which includes OTC pain medication and crutches with pt at bedside and pt agreed to plan.   Imaging Review Dg Foot Complete Right  04/23/2016  CLINICAL DATA:  Injury 4 days ago.  Foot pain EXAM: RIGHT FOOT COMPLETE - 3+ VIEW COMPARISON:  None. FINDINGS: There is no evidence of fracture or dislocation. There is no evidence of arthropathy or other focal bone abnormality. Soft tissues are unremarkable. IMPRESSION: Negative. Electronically Signed   By: Marlan Palauharles  Clark M.D.   On: 04/23/2016 09:55   I have personally reviewed and evaluated these images as part of my medical decision-making.   MDM   Final diagnoses:  Right foot pain   25 year old female who presents with right foot injury. X-Ray negative for obvious fracture or dislocation. Patient given crutches while in ED, conservative therapy recommended and discussed. Patient will be discharged home & is agreeable with above plan. Returns precautions discussed. Pt appears safe for discharge.  I personally performed the services described in this documentation, which was scribed in my presence. The recorded information has been reviewed and is accurate.      Bethel BornKelly Marie Nathaly Dawkins, PA-C 04/23/16 1134  Doug SouSam Jacubowitz, MD 04/23/16 [phone]

## 2016-04-23 NOTE — ED Notes (Signed)
Pt requesting '2 days off work".

## 2016-04-23 NOTE — ED Notes (Signed)
Someone stepped on pt's right foot with heels 3 days ago. Bruise/abrasion to right foot.

## 2016-04-23 NOTE — ED Notes (Signed)
Called ortho tech for crutches. Correct size not in stock.

## 2016-04-23 NOTE — ED Notes (Signed)
Pt requesting work note

## 2016-05-31 ENCOUNTER — Other Ambulatory Visit: Payer: Self-pay

## 2016-05-31 MED ORDER — LEVOTHYROXINE SODIUM 50 MCG PO TABS: 150.0000 ug | ORAL_TABLET | Freq: Every day | ORAL | Status: DC

## 2016-10-31 ENCOUNTER — Telehealth: Payer: Self-pay | Admitting: Endocrinology

## 2016-10-31 DIAGNOSIS — E042 Nontoxic multinodular goiter: Secondary | ICD-10-CM

## 2016-10-31 NOTE — Telephone Encounter (Signed)
See message and please advise, Thanks!  

## 2016-10-31 NOTE — Telephone Encounter (Signed)
I ordered.

## 2016-10-31 NOTE — Telephone Encounter (Signed)
Patient ask if she can get blood work drawn, before her  appointment 11/06/16. She stated her eyes are getting really puffy. Will someone call he today, she ask.

## 2016-10-31 NOTE — Telephone Encounter (Signed)
Attempted to reach the patient. Phone number was busy and would not connect. Will try again at a later time.

## 2016-10-31 NOTE — Telephone Encounter (Signed)
Attempted to reach the patient. Phone number was busy and would not connect. Will try again at a later time.   

## 2016-11-03 NOTE — Progress Notes (Deleted)
   Subjective:    Patient ID: Kristy Wright, female    DOB: 1991/08/25, 25 y.o.   MRN: [id number]  HPI Pt returns to f/u of chronic primary hypothyroidism (dx'ed 2009; US in 2016 showed goiter was slightly smaller in size tissue is mildly heterogeneous but no focal nodules).  She still has a slight "sensation" at the anterior neck, but no assoc pain.  She ran out of synthroid 1 week ago.    Review of Systems     Objective:   Physical Exam VITAL SIGNS:  See vs page GENERAL: no distress NECK: thyroid is slightly enlarged, with irreg surface.        Assessment & Plan:

## 2016-11-06 ENCOUNTER — Ambulatory Visit: Payer: PRIVATE HEALTH INSURANCE | Admitting: Endocrinology

## 2016-11-06 DIAGNOSIS — Z0289 Encounter for other administrative examinations: Secondary | ICD-10-CM

## 2016-11-06 NOTE — Telephone Encounter (Signed)
Attempted to reach the patient. Phone number was busy and would not connect. Will try again at a later time.   

## 2016-11-20 NOTE — Telephone Encounter (Addendum)
Attempted to reach the patient. Phone number was busy and would not connect.

## 2017-03-26 ENCOUNTER — Other Ambulatory Visit: Payer: Self-pay | Admitting: Endocrinology

## 2017-06-12 ENCOUNTER — Ambulatory Visit (INDEPENDENT_AMBULATORY_CARE_PROVIDER_SITE_OTHER): Payer: Managed Care, Other (non HMO) | Admitting: Endocrinology

## 2017-06-12 ENCOUNTER — Encounter: Payer: Self-pay | Admitting: Endocrinology

## 2017-06-12 VITALS — BP 132/84 | HR 73 | Wt 227.2 lb

## 2017-06-12 DIAGNOSIS — E042 Nontoxic multinodular goiter: Secondary | ICD-10-CM

## 2017-06-12 DIAGNOSIS — E781 Pure hyperglyceridemia: Secondary | ICD-10-CM

## 2017-06-12 MED ORDER — LEVOTHYROXINE SODIUM 150 MCG PO TABS: 150.0000 ug | ORAL_TABLET | Freq: Every day | ORAL | 3 refills | Status: DC

## 2017-06-12 NOTE — Patient Instructions (Addendum)
Let's recheck the ultrasound.  you will receive a phone call, about a day and time for an appointment. i have sent a prescription to your pharmacy, to refill the thyroid medication.  Please also see the dietician.   Please return in 1 year.

## 2017-06-12 NOTE — Progress Notes (Signed)
Subjective:    Patient ID: Kristy Wright, female    DOB: 1991/07/04, 26 y.o.   MRN: [id number]  HPI Pt returns to f/u of chronic primary hypothyroidism (dx'ed 2009; US in 2014 showed enlarged and somewhat hypervascular thyroid, but no nodule; f/u US in 2016 was unchanged; she has a "Mirena" IUD).  She still has a slight swelling at the anterior neck, but no assoc pain.  She takes synthroid as rx'ed.   Past Medical History:  Diagnosis Date  . HYPOTHYROIDISM 03/13/2009  . Pyelonephritis   . Thyroid activity decreased   . Thyroid activity decreased     No past surgical history on file.  Social History   Social History  . Marital status: Single    Spouse name: N/A  . Number of children: 3  . Years of education: N/A   Occupational History  . Not on file.   Social History Main Topics  . Smoking status: Never Smoker  . Smokeless tobacco: Never Used  . Alcohol use No  . Drug use: No  . Sexual activity: Yes    Birth control/ protection: None   Other Topics Concern  . Not on file   Social History Narrative   At age 26 began living with her older sister. States her mom was never really involved and she doesn't get along with her father. States they were "crazy parents". Doesn't know their history. She has 3 sons born in 2006, 2007, and 2009. Works at [employer]. Completed 10th grade but did not graduate from high school.    Current Outpatient Prescriptions on File Prior to Visit  Medication Sig Dispense Refill  . levonorgestrel (MIRENA) 20 MCG/24HR IUD 1 each by Intrauterine route once.    . Multiple Vitamins-Calcium (ONE-A-DAY WOMENS PO) Take 1 tablet by mouth daily. Reported on 11/14/2015    . [DISCONTINUED] norethindrone (CAMILA) 0.35 MG tablet Take 1 tablet by mouth daily.       No current facility-administered medications on file prior to visit.     No Known Allergies  Family History  Problem Relation Age of Onset  . Thyroid disease Sister        uncertain type  .  Hypertension Sister   . Stroke Other        Grandmother-CVA  . Cancer Mother        cervical  . Diabetes Maternal Grandmother   . Heart disease Maternal Grandmother   . Hypertension Maternal Grandmother   . Cancer Paternal Grandmother        breast  . Hypertension Paternal Grandmother   . Diabetes Paternal Grandfather     BP 132/84   Pulse 73   Wt 227 lb 3.2 oz (103.1 kg)   SpO2 98%   BMI 39.00 kg/m    Review of Systems Denies sob.  She has gained weight.     Objective:   Physical Exam VITAL SIGNS:  See vs page GENERAL: no distress NECK: thyroid is slightly enlarged, with multinodular surface (R>L).    outside test results are reviewed: TSH=1.9 Triglycerides=232 (not stated if fasting or not)    Assessment & Plan:  Hypertriglyceridemia.  Recheck today Hypothyroidism: well-replaced. Multinodular goiter: due for recheck  Patient Instructions  Let's recheck the ultrasound.  you will receive a phone call, about a day and time for an appointment. i have sent a prescription to your pharmacy, to refill the thyroid medication.  Please also see the dietician.   Please return in 1 year.

## 2017-07-04 ENCOUNTER — Encounter: Payer: PRIVATE HEALTH INSURANCE | Admitting: Dietician

## 2017-08-11 ENCOUNTER — Emergency Department (HOSPITAL_COMMUNITY): Payer: 59

## 2017-08-11 ENCOUNTER — Encounter (HOSPITAL_COMMUNITY): Payer: Self-pay | Admitting: *Deleted

## 2017-08-11 ENCOUNTER — Emergency Department (HOSPITAL_COMMUNITY)
Admission: EM | Admit: 2017-08-11 | Discharge: 2017-08-11 | Disposition: A | Discharge status: Home / Self Care | Payer: 59 | Attending: Emergency Medicine | Admitting: Emergency Medicine

## 2017-08-11 DIAGNOSIS — R109 Unspecified abdominal pain: Secondary | ICD-10-CM

## 2017-08-11 DIAGNOSIS — E039 Hypothyroidism, unspecified: Secondary | ICD-10-CM | POA: Insufficient documentation

## 2017-08-11 DIAGNOSIS — R1084 Generalized abdominal pain: Secondary | ICD-10-CM | POA: Diagnosis present

## 2017-08-11 DIAGNOSIS — Z79899 Other long term (current) drug therapy: Secondary | ICD-10-CM | POA: Insufficient documentation

## 2017-08-11 LAB — URINALYSIS, ROUTINE W REFLEX MICROSCOPIC
Bilirubin Urine: NEGATIVE
Glucose, UA: NEGATIVE mg/dL
Hgb urine dipstick: NEGATIVE
Ketones, ur: NEGATIVE mg/dL
Nitrite: NEGATIVE
PROTEIN: NEGATIVE mg/dL
SPECIFIC GRAVITY, URINE: 1.026 (ref 1.005–1.030)
pH: 5 (ref 5.0–8.0)

## 2017-08-11 LAB — CBC
HEMATOCRIT: 39 % (ref 36.0–46.0)
Hemoglobin: 13 g/dL (ref 12.0–15.0)
MCH: 29.5 pg (ref 26.0–34.0)
MCHC: 33.3 g/dL (ref 30.0–36.0)
MCV: 88.4 fL (ref 78.0–100.0)
PLATELETS: 200 10*3/uL (ref 150–400)
RBC: 4.41 MIL/uL (ref 3.87–5.11)
RDW: 12.9 % (ref 11.5–15.5)
WBC: 6.5 10*3/uL (ref 4.0–10.5)

## 2017-08-11 LAB — COMPREHENSIVE METABOLIC PANEL
ALT: 33 U/L (ref 14–54)
AST: 27 U/L (ref 15–41)
Albumin: 3.9 g/dL (ref 3.5–5.0)
Alkaline Phosphatase: 80 U/L (ref 38–126)
Anion gap: 10 (ref 5–15)
BILIRUBIN TOTAL: 0.6 mg/dL (ref 0.3–1.2)
BUN: 17 mg/dL (ref 6–20)
CHLORIDE: 109 mmol/L (ref 101–111)
CO2: 17 mmol/L — AB (ref 22–32)
CREATININE: 0.84 mg/dL (ref 0.44–1.00)
Calcium: 8.7 mg/dL — ABNORMAL LOW (ref 8.9–10.3)
GLUCOSE: 100 mg/dL — AB (ref 65–99)
Potassium: 4 mmol/L (ref 3.5–5.1)
Sodium: 136 mmol/L (ref 135–145)
Total Protein: 7.4 g/dL (ref 6.5–8.1)

## 2017-08-11 LAB — LIPASE, BLOOD: LIPASE: 32 U/L (ref 11–51)

## 2017-08-11 LAB — POC URINE PREG, ED: PREG TEST UR: NEGATIVE

## 2017-08-11 MED ORDER — NAPROXEN 500 MG PO TABS: 500.0000 mg | ORAL_TABLET | Freq: Two times a day (BID) | ORAL | 0 refills | Status: DC

## 2017-08-11 MED ORDER — ONDANSETRON HCL 4 MG/2ML IJ SOLN
4.0000 mg | Freq: Once | INTRAMUSCULAR | Status: AC
  Administered 2017-08-11: 4 mg via INTRAVENOUS
  Filled 2017-08-11: qty 2

## 2017-08-11 MED ORDER — SODIUM CHLORIDE 0.9 % IV BOLUS (SEPSIS)
1000.0000 mL | Freq: Once | INTRAVENOUS | Status: AC
  Administered 2017-08-11: 1000 mL via INTRAVENOUS

## 2017-08-11 MED ORDER — HYDROMORPHONE HCL 1 MG/ML IJ SOLN
1.0000 mg | Freq: Once | INTRAMUSCULAR | Status: AC
Start: 2017-08-11 — End: 2017-08-11
  Administered 2017-08-11: 1 mg via INTRAVENOUS
  Filled 2017-08-11: qty 1

## 2017-08-11 MED ORDER — KETOROLAC TROMETHAMINE 30 MG/ML IJ SOLN
30.0000 mg | Freq: Once | INTRAMUSCULAR | Status: AC
  Administered 2017-08-11: 30 mg via INTRAVENOUS
  Filled 2017-08-11: qty 1

## 2017-08-11 MED ORDER — CYCLOBENZAPRINE HCL 10 MG PO TABS: 10.0000 mg | ORAL_TABLET | Freq: Two times a day (BID) | ORAL | 0 refills | Status: DC | PRN

## 2017-08-11 NOTE — Discharge Instructions (Signed)
The blood tests ultrasound, and CAT scan are normal.  Follow-up with your primary care doctor later this week.  Return for  worsening sx

## 2017-08-11 NOTE — ED Notes (Signed)
Got patient hooked up to the monitor patient is resting with call bell in reach 

## 2017-08-11 NOTE — ED Notes (Signed)
Patient is resting with call bell in reach  

## 2017-08-11 NOTE — ED Provider Notes (Signed)
[facility]-EMERGENCY DEPT Provider Note   CSN: [id number] Arrival date & time: 08/11/17  0865     History   Chief Complaint Chief Complaint  Patient presents with  . Abdominal Pain    HPI Kristy Wright is a 26 y.o. female.  HPI Pt started having pain yesterday morning.  As the day progressed the sx got worse.  Today it is severe.  The pain is in the right flank, does not radiate.   No urinary sx.  NO dysuria.  Pt ate yesterday but it did not make it worse or better.  NO vomiting.  No diarrhea. Past Medical History:  Diagnosis Date  . HYPOTHYROIDISM 03/13/2009  . Pyelonephritis   . Thyroid activity decreased   . Thyroid activity decreased     Patient Active Problem List   Diagnosis Date Noted  . Hypertriglyceridemia 06/12/2017  . Back pain, chronic 08/26/2013  . IUD (intrauterine device) in place 08/26/2013  . Thyroid activity decreased   . Nontoxic multinodular goiter 04/06/2011  . HYPOTHYROIDISM 03/13/2009  . UTI 03/13/2009    History reviewed. No pertinent surgical history.  OB History    Gravida Para Term Preterm AB Living   SAB TAB Ectopic Multiple Live Births           5       Home Medications    Prior to Admission medications   Medication Sig Start Date End Date Taking? Authorizing Provider  escitalopram (LEXAPRO) 20 MG tablet Take 20 mg by mouth daily. 06/26/17  Yes [provider]  ibuprofen (ADVIL,MOTRIN) 200 MG tablet Take 400 mg by mouth every 6 (six) hours as needed for headache or mild pain.   Yes [provider]  levonorgestrel (MIRENA) 20 MCG/24HR IUD 1 each by Intrauterine route once.   Yes [provider]  levothyroxine (SYNTHROID, LEVOTHROID) 150 MCG tablet Take 1 tablet (150 mcg total) by mouth daily. 06/12/17  Yes Romero Belling, MD  cyclobenzaprine (FLEXERIL) 10 MG tablet Take 1 tablet (10 mg total) by mouth 2 (two) times daily as needed for muscle spasms. 08/11/17   Linwood Dibbles, MD  Multiple  Vitamins-Calcium (ONE-A-DAY WOMENS PO) Take 1 tablet by mouth daily. Reported on 11/14/2015    [provider]  naproxen (NAPROSYN) 500 MG tablet Take 1 tablet (500 mg total) by mouth 2 (two) times daily. 08/11/17   Linwood Dibbles, MD    Family History Family History  Problem Relation Age of Onset  . Thyroid disease Sister        uncertain type  . Hypertension Sister   . Stroke Other        Grandmother-CVA  . Cancer Mother        cervical  . Diabetes Maternal Grandmother   . Heart disease Maternal Grandmother   . Hypertension Maternal Grandmother   . Cancer Paternal Grandmother        breast  . Hypertension Paternal Grandmother   . Diabetes Paternal Grandfather     Social History Social History  Substance Use Topics  . Smoking status: Never Smoker  . Smokeless tobacco: Never Used  . Alcohol use No     Allergies   Patient has no known allergies.   Review of Systems Review of Systems  All other systems reviewed and are negative.    Physical Exam Updated Vital Signs BP 107/66 (BP Location: Left Arm)   Pulse (!) 57   Temp 97.8 F (36.6  C) (Oral)   Resp 18   SpO2 100%   Physical Exam  Constitutional: She appears well-developed and well-nourished. She appears distressed.  overweight  HENT:  Head: Normocephalic and atraumatic.  Right Ear: External ear normal.  Left Ear: External ear normal.  Eyes: Conjunctivae are normal. Right eye exhibits no discharge. Left eye exhibits no discharge. No scleral icterus.  Neck: Neck supple. No tracheal deviation present.  Cardiovascular: Normal rate, regular rhythm and intact distal pulses.   Pulmonary/Chest: Effort normal and breath sounds normal. No stridor. No respiratory distress. She has no wheezes. She has no rales.  Abdominal: Soft. Bowel sounds are normal. She exhibits no distension and no mass. There is no hepatosplenomegaly. There is tenderness in the right upper quadrant. There is positive Murphy's sign. There is  no rebound and no guarding. No hernia.  Musculoskeletal: She exhibits no edema or tenderness.  Neurological: She is alert. She has normal strength. No cranial nerve deficit (no facial droop, extraocular movements intact, no slurred speech) or sensory deficit. She exhibits normal muscle tone. She displays no seizure activity. Coordination normal.  Skin: Skin is warm. No rash noted. She is diaphoretic.  Psychiatric: She has a normal mood and affect.  Nursing note and vitals reviewed.    ED Treatments / Results  Labs (all labs ordered are listed, but only abnormal results are displayed) Labs Reviewed  COMPREHENSIVE METABOLIC PANEL - Abnormal; Notable for the following:       Result Value   CO2 17 (*)    Glucose, Bld 100 (*)    Calcium 8.7 (*)    All other components within normal limits  URINALYSIS, ROUTINE W REFLEX MICROSCOPIC - Abnormal; Notable for the following:    APPearance CLOUDY (*)    Leukocytes, UA SMALL (*)    Bacteria, UA RARE (*)    Squamous Epithelial / LPF 6-30 (*)    All other components within normal limits  LIPASE, BLOOD  CBC  POC URINE PREG, ED    EKG  EKG Interpretation None       Radiology Ct Abdomen Pelvis Wo Contrast  Result Date: 08/11/2017 CLINICAL DATA:  Right flank pain since yesterday EXAM: CT ABDOMEN AND PELVIS WITHOUT CONTRAST TECHNIQUE: Multidetector CT imaging of the abdomen and pelvis was performed following the standard protocol without IV contrast. COMPARISON:  None. FINDINGS: Lower chest: No acute abnormality. Hepatobiliary: No focal liver abnormality is seen. No gallstones, gallbladder wall thickening, or biliary dilatation. Pancreas: Unremarkable. No pancreatic ductal dilatation or surrounding inflammatory changes. Spleen: Normal in size without focal abnormality. Adrenals/Urinary Tract: Adrenal glands are unremarkable. Kidneys are normal, without renal calculi, focal lesion, or hydronephrosis. Bladder is unremarkable. Stomach/Bowel: Stomach  is within normal limits. Appendix appears normal. No evidence of bowel wall thickening, distention, or inflammatory changes. Vascular/Lymphatic: No significant vascular findings are present. No enlarged abdominal or pelvic lymph nodes. Reproductive: Uterus and bilateral adnexa are unremarkable. Intrauterine device in satisfactory position. Other: No abdominal wall hernia or abnormality. No abdominopelvic ascites. Musculoskeletal: No acute osseous abnormality. No lytic or sclerotic osseous lesion. IMPRESSION: 1. No acute abdominal or pelvic pathology. 2. No urolithiasis or obstructive uropathy. Electronically Signed   By: Elige Ko   On: 08/11/2017 12:10   US Abdomen Complete  Result Date: 08/11/2017 CLINICAL DATA:  Right flank pain EXAM: ABDOMEN ULTRASOUND COMPLETE COMPARISON:  Ultrasound abdomen 02/11/2008 FINDINGS: Gallbladder: No gallstones or wall thickening visualized. No sonographic Murphy sign noted by sonographer. Common bile duct: Diameter: 2.5 mm Liver: No  focal lesion identified. Within normal limits in parenchymal echogenicity. Portal vein is patent on color Doppler imaging with normal direction of blood flow towards the liver. IVC: No abnormality visualized. Pancreas: Visualized portion unremarkable. Spleen: Size and appearance within normal limits. Right Kidney: Length: 10.6 cm. Echogenicity within normal limits. No mass or hydronephrosis visualized. Left Kidney: Length: 11.7 cm. Echogenicity within normal limits. No mass or hydronephrosis visualized. Abdominal aorta: No aneurysm visualized. Other findings: None. IMPRESSION: Negative ultrasound of the abdomen. Electronically Signed   By: Marlan Palau M.D.   On: 08/11/2017 11:27    Procedures Procedures (including critical care time)  Medications Ordered in ED Medications  HYDROmorphone (DILAUDID) injection 1 mg (1 mg Intravenous Given 08/11/17 1012)  sodium chloride 0.9 % bolus 1,000 mL (0 mLs Intravenous Stopped 08/11/17 1228)    ondansetron (ZOFRAN) injection 4 mg (4 mg Intravenous Given 08/11/17 1012)  ketorolac (TORADOL) 30 MG/ML injection 30 mg (30 mg Intravenous Given 08/11/17 1229)     Initial Impression / Assessment and Plan / ED Course  I have reviewed the triage vital signs and the nursing notes.  Pertinent labs & imaging results that were available during my care of the patient were reviewed by me and considered in my medical decision making (see chart for details).   patient presented to the emergency room with acute onset of right flank pain. She had tenderness to palpation in the right upper quadrant. Symptoms were concerning for the possibility of biliary colic or ureteral colic. Laboratory tests are reassuring. Ultrasound and CT scan did not show any acute pathology. It is possible symptoms may be related to muscular pain.  At this point, no signs of any acute emergency medical condition. Patient is stable for discharge and outpatient follow-up.  Final Clinical Impressions(s) / ED Diagnoses   Final diagnoses:  Flank pain    New Prescriptions New Prescriptions   CYCLOBENZAPRINE (FLEXERIL) 10 MG TABLET    Take 1 tablet (10 mg total) by mouth 2 (two) times daily as needed for muscle spasms.   NAPROXEN (NAPROSYN) 500 MG TABLET    Take 1 tablet (500 mg total) by mouth 2 (two) times daily.     Linwood Dibbles, MD 08/11/17 1351

## 2017-08-11 NOTE — ED Notes (Signed)
Pt states pain is severe and intermittent to right upper abdomen and into back  With intermittent sharper pain.  Denies vaginal discharge or bleeding and states she has IUD

## 2017-08-11 NOTE — ED Triage Notes (Signed)
To ED for eval of right side pain since yesterday. Denies injury. Points to flank area. No n/v. Denies pain with urination. States feels like grabbing at times.

## 2017-08-11 NOTE — ED Notes (Signed)
Pt transported to US

## 2017-09-12 ENCOUNTER — Other Ambulatory Visit: Payer: Self-pay

## 2017-09-12 MED ORDER — LEVOTHYROXINE SODIUM 150 MCG PO TABS: 150.0000 ug | ORAL_TABLET | Freq: Every day | ORAL | 3 refills | Status: DC

## 2018-03-30 ENCOUNTER — Emergency Department (HOSPITAL_COMMUNITY)
Admission: EM | Admit: 2018-03-30 | Discharge: 2018-03-30 | Disposition: A | Discharge status: Home / Self Care | Payer: 59 | Attending: Emergency Medicine | Admitting: Emergency Medicine

## 2018-03-30 ENCOUNTER — Emergency Department (HOSPITAL_COMMUNITY): Payer: 59

## 2018-03-30 ENCOUNTER — Other Ambulatory Visit: Payer: Self-pay

## 2018-03-30 ENCOUNTER — Encounter (HOSPITAL_COMMUNITY): Payer: Self-pay | Admitting: *Deleted

## 2018-03-30 DIAGNOSIS — R1031 Right lower quadrant pain: Secondary | ICD-10-CM | POA: Diagnosis present

## 2018-03-30 DIAGNOSIS — N390 Urinary tract infection, site not specified: Secondary | ICD-10-CM | POA: Diagnosis not present

## 2018-03-30 DIAGNOSIS — E039 Hypothyroidism, unspecified: Secondary | ICD-10-CM | POA: Insufficient documentation

## 2018-03-30 DIAGNOSIS — Z79899 Other long term (current) drug therapy: Secondary | ICD-10-CM | POA: Insufficient documentation

## 2018-03-30 LAB — COMPREHENSIVE METABOLIC PANEL
ALBUMIN: 4.2 g/dL (ref 3.5–5.0)
ALT: 25 U/L (ref 14–54)
AST: 21 U/L (ref 15–41)
Alkaline Phosphatase: 94 U/L (ref 38–126)
Anion gap: 11 (ref 5–15)
BUN: 14 mg/dL (ref 6–20)
CHLORIDE: 102 mmol/L (ref 101–111)
CO2: 22 mmol/L (ref 22–32)
Calcium: 9.7 mg/dL (ref 8.9–10.3)
Creatinine, Ser: 0.68 mg/dL (ref 0.44–1.00)
GFR calc Af Amer: >60 mL/min (ref >60)
GFR calc non Af Amer: >60 mL/min (ref >60)
GLUCOSE: 104 mg/dL — AB (ref 65–99)
Potassium: 3.8 mmol/L (ref 3.5–5.1)
SODIUM: 135 mmol/L (ref 135–145)
Total Bilirubin: 1.1 mg/dL (ref 0.3–1.2)
Total Protein: 8 g/dL (ref 6.5–8.1)

## 2018-03-30 LAB — URINALYSIS, ROUTINE W REFLEX MICROSCOPIC
Bilirubin Urine: NEGATIVE
Glucose, UA: NEGATIVE mg/dL
Ketones, ur: NEGATIVE mg/dL
NITRITE: NEGATIVE
PROTEIN: 30 mg/dL — AB
Specific Gravity, Urine: 1.016 (ref 1.005–1.030)
pH: 5 (ref 5.0–8.0)

## 2018-03-30 LAB — CBC
HEMATOCRIT: 42.9 % (ref 36.0–46.0)
Hemoglobin: 14.2 g/dL (ref 12.0–15.0)
MCH: 29.3 pg (ref 26.0–34.0)
MCHC: 33.1 g/dL (ref 30.0–36.0)
MCV: 88.6 fL (ref 78.0–100.0)
Platelets: 260 10*3/uL (ref 150–400)
RBC: 4.84 MIL/uL (ref 3.87–5.11)
RDW: 13.5 % (ref 11.5–15.5)
WBC: 10.4 10*3/uL (ref 4.0–10.5)

## 2018-03-30 LAB — I-STAT BETA HCG BLOOD, ED (MC, WL, AP ONLY)

## 2018-03-30 LAB — LIPASE, BLOOD: Lipase: 27 U/L (ref 11–51)

## 2018-03-30 MED ORDER — NAPROXEN 500 MG PO TABS: 500.0000 mg | ORAL_TABLET | Freq: Two times a day (BID) | ORAL | 0 refills | Status: DC

## 2018-03-30 MED ORDER — IOHEXOL 300 MG/ML  SOLN
100.0000 mL | Freq: Once | INTRAMUSCULAR | Status: AC | PRN
  Administered 2018-03-30: 100 mL via INTRAVENOUS

## 2018-03-30 MED ORDER — CEPHALEXIN 500 MG PO CAPS: 500.0000 mg | ORAL_CAPSULE | Freq: Four times a day (QID) | ORAL | 0 refills | Status: DC

## 2018-03-30 MED ORDER — SODIUM CHLORIDE 0.9 % IV BOLUS
1000.0000 mL | Freq: Once | INTRAVENOUS | Status: AC
  Administered 2018-03-30: 1000 mL via INTRAVENOUS

## 2018-03-30 MED ORDER — ONDANSETRON 4 MG PO TBDP
4.0000 mg | ORAL_TABLET | Freq: Once | ORAL | Status: AC
  Administered 2018-03-30: 4 mg via ORAL
  Filled 2018-03-30: qty 1

## 2018-03-30 MED ORDER — MORPHINE SULFATE (PF) 4 MG/ML IV SOLN
2.0000 mg | Freq: Once | INTRAVENOUS | Status: AC
  Administered 2018-03-30: 2 mg via INTRAVENOUS
  Filled 2018-03-30: qty 1

## 2018-03-30 MED ORDER — ONDANSETRON 4 MG PO TBDP: 4.0000 mg | ORAL_TABLET | Freq: Three times a day (TID) | ORAL | 0 refills | Status: DC | PRN

## 2018-03-30 MED ORDER — CEPHALEXIN 250 MG PO CAPS
500.0000 mg | ORAL_CAPSULE | Freq: Once | ORAL | Status: AC
  Administered 2018-03-30: 500 mg via ORAL
  Filled 2018-03-30: qty 2

## 2018-03-30 NOTE — ED Provider Notes (Signed)
[facility] EMERGENCY DEPARTMENT Provider Note   CSN: [id number] Arrival date & time: 03/30/18  1315     History   Chief Complaint Chief Complaint  Patient presents with  . Abdominal Pain    HPI Kristy Wright is a 27 y.o. female with a past medical history of hypothyroidism, who presents to ED for evaluation of right lower quadrant abdominal pain since 2 nights ago.  She reports 1 episode of nonbloody, nonbilious emesis this morning when she tried to eat something.  She reports the pain is constant and sometimes radiates to her right flank.  She reports associated chills.  She has not taken any medications prior to arrival to help with symptoms.  Last normal bowel movement was today.  She denies any dysuria, vaginal discharge, abnormal vaginal bleeding, hematochezia or melena, hematuria, shortness of breath. Note: Chief complaint of right lower quadrant abdominal pain contradicts nurse's note which reports left lower quadrant abdominal pain.  Patient denies left lower quadrant abdominal pain to me.  HPI  Past Medical History:  Diagnosis Date  . HYPOTHYROIDISM 03/13/2009  . Pyelonephritis   . Thyroid activity decreased   . Thyroid activity decreased     Patient Active Problem List   Diagnosis Date Noted  . Hypertriglyceridemia 06/12/2017  . Back pain, chronic 08/26/2013  . IUD (intrauterine device) in place 08/26/2013  . Thyroid activity decreased   . Nontoxic multinodular goiter 04/06/2011  . HYPOTHYROIDISM 03/13/2009  . UTI 03/13/2009    History reviewed. No pertinent surgical history.   OB History    Gravida  5   Para  5   Term  5   Preterm      AB      Living  5     SAB      TAB      Ectopic      Multiple      Live Births  5            Home Medications    Prior to Admission medications   Medication Sig Start Date End Date Taking? Authorizing Provider  levonorgestrel (MIRENA) 20 MCG/24HR IUD 1 each by Intrauterine route  once.   Yes [provider]  levothyroxine (SYNTHROID, LEVOTHROID) 150 MCG tablet Take 1 tablet (150 mcg total) by mouth daily. Patient taking differently: Take 250 mcg by mouth daily.  09/12/17  Yes Romero Belling, MD  cephALEXin (KEFLEX) 500 MG capsule Take 1 capsule (500 mg total) by mouth 4 (four) times daily. 03/30/18   Salar Molden, PA-C  cyclobenzaprine (FLEXERIL) 10 MG tablet Take 1 tablet (10 mg total) by mouth 2 (two) times daily as needed for muscle spasms. Patient not taking: Reported on 03/30/2018 08/11/17   Linwood Dibbles, MD  naproxen (NAPROSYN) 500 MG tablet Take 1 tablet (500 mg total) by mouth 2 (two) times daily. 03/30/18   Jeremie Abdelaziz, PA-C  ondansetron (ZOFRAN ODT) 4 MG disintegrating tablet Take 1 tablet (4 mg total) by mouth every 8 (eight) hours as needed for nausea or vomiting. 03/30/18   Gian Ybarra, PA-C  norethindrone (CAMILA) 0.35 MG tablet Take 1 tablet by mouth daily.    01/31/12  [provider]    Family History Family History  Problem Relation Age of Onset  . Thyroid disease Sister        uncertain type  . Hypertension Sister   . Stroke Other        Grandmother-CVA  . Cancer Mother  cervical  . Diabetes Maternal Grandmother   . Heart disease Maternal Grandmother   . Hypertension Maternal Grandmother   . Cancer Paternal Grandmother        breast  . Hypertension Paternal Grandmother   . Diabetes Paternal Grandfather     Social History Social History   Tobacco Use  . Smoking status: Never Smoker  . Smokeless tobacco: Never Used  Substance Use Topics  . Alcohol use: No  . Drug use: No     Allergies   Patient has no known allergies.   Review of Systems Review of Systems  Constitutional: Negative for appetite change, chills and fever.  HENT: Negative for ear pain, rhinorrhea, sneezing and sore throat.   Eyes: Negative for photophobia and visual disturbance.  Respiratory: Negative for cough, chest tightness, shortness of breath  and wheezing.   Cardiovascular: Negative for chest pain and palpitations.  Gastrointestinal: Positive for abdominal pain and nausea. Negative for blood in stool, constipation, diarrhea and vomiting.  Genitourinary: Negative for dysuria, flank pain, hematuria, menstrual problem, urgency, vaginal bleeding and vaginal discharge.  Musculoskeletal: Negative for myalgias.  Skin: Negative for rash.  Neurological: Negative for dizziness, weakness and light-headedness.     Physical Exam Updated Vital Signs BP 133/87 (BP Location: Right Arm)   Pulse 75   Temp 99.1 F (37.3 C) (Oral)   Resp 14   Ht  (1.626 m)   Wt 86.2 kg (190 lb)   SpO2 97%   BMI 32.61 kg/m   Physical Exam  Constitutional: She appears well-developed and well-nourished. No distress.  HENT:  Head: Normocephalic and atraumatic.  Nose: Nose normal.  Eyes: Conjunctivae and EOM are normal. Left eye exhibits no discharge. No scleral icterus.  Neck: Normal range of motion. Neck supple.  Cardiovascular: Normal rate, regular rhythm, normal heart sounds and intact distal pulses. Exam reveals no gallop and no friction rub.  No murmur heard. Pulmonary/Chest: Effort normal and breath sounds normal. No respiratory distress.  Abdominal: Soft. Bowel sounds are normal. She exhibits no distension. There is tenderness (Right lower quadrant and right flank) in the right lower quadrant. There is no guarding.  Musculoskeletal: Normal range of motion. She exhibits no edema.  Neurological: She is alert. She exhibits normal muscle tone. Coordination normal.  Skin: Skin is warm and dry. No rash noted.  Psychiatric: She has a normal mood and affect.  Nursing note and vitals reviewed.    ED Treatments / Results  Labs (all labs ordered are listed, but only abnormal results are displayed) Labs Reviewed  COMPREHENSIVE METABOLIC PANEL - Abnormal; Notable for the following components:      Result Value   Glucose, Bld 104 (*)    All other  components within normal limits  URINALYSIS, ROUTINE W REFLEX MICROSCOPIC - Abnormal; Notable for the following components:   APPearance HAZY (*)    Hgb urine dipstick LARGE (*)    Protein, ur 30 (*)    Leukocytes, UA MODERATE (*)    Bacteria, UA RARE (*)    All other components within normal limits  LIPASE, BLOOD  CBC  I-STAT BETA HCG BLOOD, ED ([facility], [facility], [facility] ONLY)    EKG None  Radiology Ct Abdomen Pelvis W Contrast  Result Date: 03/30/2018 CLINICAL DATA:  RIGHT lower quadrant abdominal pain with nausea and vomiting. EXAM: CT ABDOMEN AND PELVIS WITH CONTRAST TECHNIQUE: Multidetector CT imaging of the abdomen and pelvis was performed using the standard protocol following bolus administration of intravenous contrast. CONTRAST:  OMNIPAQUE IOHEXOL 300 MG/ML  SOLN COMPARISON:  08/11/2017. FINDINGS: Lower chest: No acute abnormality. Hepatobiliary: No focal liver abnormality is seen. No gallstones, gallbladder wall thickening, or biliary dilatation. Pancreas: Unremarkable. No pancreatic ductal dilatation or surrounding inflammatory changes. Spleen: Normal in size without focal abnormality. Adrenals/Urinary Tract: Adrenal glands are unremarkable. Kidneys are normal, without renal calculi, focal lesion, or hydronephrosis. Bladder is unremarkable. Stomach/Bowel: Stomach is within normal limits. Appendix appears normal. No evidence of bowel wall thickening, distention, or inflammatory changes. Vascular/Lymphatic: No significant vascular findings are present. No enlarged abdominal or pelvic lymph nodes. Reproductive: Uterus and bilateral adnexa are unremarkable. IUD is located within the uterus. Other: No abdominal wall hernia or abnormality. No abdominopelvic ascites. Musculoskeletal: No acute or significant osseous findings. IMPRESSION: Negative exam.  No change from priors. Electronically Signed   By: Elsie Stain M.D.   On: 03/30/2018 22:50    Procedures Procedures (including critical care  time)  Medications Ordered in ED Medications  cephALEXin (KEFLEX) capsule 500 mg (has no administration in time range)  ondansetron (ZOFRAN-ODT) disintegrating tablet 4 mg (4 mg Oral Given 03/30/18 1402)  sodium chloride 0.9 % bolus 1,000 mL (0 mLs Intravenous Stopped 03/30/18 2102)  morphine 4 MG/ML injection 2 mg (2 mg Intravenous Given 03/30/18 2001)  iohexol (OMNIPAQUE) 300 MG/ML solution 100 mL (100 mLs Intravenous Contrast Given 03/30/18 2209)     Initial Impression / Assessment and Plan / ED Course  I have reviewed the triage vital signs and the nursing notes.  Pertinent labs & imaging results that were available during my care of the patient were reviewed by me and considered in my medical decision making (see chart for details).     Patient presents to ED for evaluation of right lower quadrant abdominal pain since 2 nights ago.  She reports 1 episodes of nonbloody, nonbilious emesis this morning when she tried to eat something.  Reports that the pain is constant and sometimes radiates to the right flank.  Denies any dysuria, vaginal discharge, abnormal vaginal bleeding, hematuria or shortness of breath.  On physical exam there is right lower quadrant abdominal tenderness to palpation without rebound or guarding noted.  She is afebrile.  Lab work including CBC, CMP, lipase, hCG unremarkable.  Urinalysis with positive leukocytes and WBCs that could be indicative of UTI.  CT of the abdomen and pelvis was done to evaluate for appendicitis due to right lower quadrant abdominal pain.  This was negative for any acute abnormality.  Will treat with antibiotics for UTI and advised her to follow-up with PCP for further evaluation if symptoms persist.  Advised to return to ED for any severe worsening symptoms.  Portions of this note were generated with Scientist, clinical (histocompatibility and immunogenetics). Dictation errors may occur despite best attempts at proofreading.   Final Clinical Impressions(s) / ED Diagnoses   Final  diagnoses:  Lower urinary tract infectious disease    ED Discharge Orders        Ordered    cephALEXin (KEFLEX) 500 MG capsule  4 times daily     03/30/18 2300    naproxen (NAPROSYN) 500 MG tablet  2 times daily     03/30/18 2300    ondansetron (ZOFRAN ODT) 4 MG disintegrating tablet  Every 8 hours PRN     03/30/18 2300       Dietrich Pates, PA-C 03/30/18 2305    Jacalyn Lefevre, MD 03/30/18 2310

## 2018-03-30 NOTE — ED Notes (Signed)
Pt is fast track pt, see PA assessment.

## 2018-03-30 NOTE — ED Notes (Signed)
Pt transported to CT ?

## 2018-03-30 NOTE — ED Triage Notes (Signed)
Pt in c/o LLQ abd x 2 days ago, denies n/d, pt vomited once today, c/o fevers, A&O x4

## 2018-06-12 ENCOUNTER — Ambulatory Visit: Payer: PRIVATE HEALTH INSURANCE | Admitting: Endocrinology

## 2018-06-12 DIAGNOSIS — Z0289 Encounter for other administrative examinations: Secondary | ICD-10-CM

## 2018-07-07 ENCOUNTER — Ambulatory Visit (INDEPENDENT_AMBULATORY_CARE_PROVIDER_SITE_OTHER): Payer: 59 | Admitting: Endocrinology

## 2018-07-07 ENCOUNTER — Encounter: Payer: Self-pay | Admitting: Endocrinology

## 2018-07-07 VITALS — BP 102/73 | HR 74 | Wt 233.0 lb

## 2018-07-07 DIAGNOSIS — E039 Hypothyroidism, unspecified: Secondary | ICD-10-CM

## 2018-07-07 DIAGNOSIS — R635 Abnormal weight gain: Secondary | ICD-10-CM | POA: Insufficient documentation

## 2018-07-07 LAB — TSH: TSH: 4.91 u[IU]/mL — AB (ref 0.35–4.50)

## 2018-07-07 MED ORDER — LEVOTHYROXINE SODIUM 150 MCG PO TABS: 150.0000 ug | ORAL_TABLET | Freq: Every day | ORAL | 3 refills | Status: DC

## 2018-07-07 MED ORDER — LEVOTHYROXINE SODIUM 175 MCG PO TABS: 175.0000 ug | ORAL_TABLET | Freq: Every day | ORAL | 11 refills | Status: DC

## 2018-07-07 NOTE — Patient Instructions (Addendum)
blood tests are requested for you today.  We'll let you know about the results. Please also see the dietician specialist Please come back for a follow-up appointment in 1 year.

## 2018-07-07 NOTE — Progress Notes (Signed)
Subjective:    Patient ID: Kristy Wright, female    DOB: [birth year], 27 y.o.   MRN: [id number]  HPI Pt returns to f/u of chronic primary hypothyroidism (dx'ed 2009; US in 2014 showed enlarged and somewhat hypervascular thyroid, but no nodule; f/u US in 2016 was unchanged; she has a "Mirena" IUD).  She takes synthroid as rx'ed. However, she reports fatigue and weight gain.   Past Medical History:  Diagnosis Date  . HYPOTHYROIDISM 03/13/2009  . Pyelonephritis   . Thyroid activity decreased   . Thyroid activity decreased     No past surgical history on file.  Social History   Socioeconomic History  . Marital status: Single    Spouse name: Not on file  . Number of children: 3  . Years of education: Not on file  . Highest education level: Not on file  Occupational History  . Not on file  Social Needs  . Financial resource strain: Not on file  . Food insecurity:    Worry: Not on file    Inability: Not on file  . Transportation needs:    Medical: Not on file    Non-medical: Not on file  Tobacco Use  . Smoking status: Never Smoker  . Smokeless tobacco: Never Used  Substance and Sexual Activity  . Alcohol use: No  . Drug use: No  . Sexual activity: Yes    Birth control/protection: None  Lifestyle  . Physical activity:    Days per week: Not on file    Minutes per session: Not on file  . Stress: Not on file  Relationships  . Social connections:    Talks on phone: Not on file    Gets together: Not on file    Attends religious service: Not on file    Active member of club or organization: Not on file    Attends meetings of clubs or organizations: Not on file    Relationship status: Not on file  . Intimate partner violence:    Fear of current or ex partner: Not on file    Emotionally abused: Not on file    Physically abused: Not on file    Forced sexual activity: Not on file  Other Topics Concern  . Not on file  Social History Narrative   At age 27 began living  with her older sister. States her mom was never really involved and she doesn't get along with her father. States they were "crazy parents". Doesn't know their history. She has 3 sons born in 2006, 2007, and 2009. Works at [employer]. Completed 10th grade but did not graduate from high school.    Current Outpatient Medications on File Prior to Visit  Medication Sig Dispense Refill  . levonorgestrel (MIRENA) 20 MCG/24HR IUD 1 each by Intrauterine route once.    . ondansetron (ZOFRAN ODT) 4 MG disintegrating tablet Take 1 tablet (4 mg total) by mouth every 8 (eight) hours as needed for nausea or vomiting. 3 tablet 0   No current facility-administered medications on file prior to visit.     No Known Allergies  Family History  Problem Relation Age of Onset  . Thyroid disease Sister        uncertain type  . Hypertension Sister   . Stroke Other        Grandmother-CVA  . Cancer Mother        cervical  . Diabetes Maternal Grandmother   . Heart disease Maternal Grandmother   . Hypertension  Maternal Grandmother   . Cancer Paternal Grandmother        breast  . Hypertension Paternal Grandmother   . Diabetes Paternal Grandfather     BP 102/73   Pulse 74   Wt 233 lb (105.7 kg)   SpO2 97%   BMI 39.99 kg/m    Review of Systems Denies leg swelling.     Objective:   Physical Exam VITAL SIGNS:  See vs page GENERAL: no distress NECK: thyroid is slightly enlarged, with irregular surface.        Assessment & Plan:  Hypothyroidism: recheck today Obesity: worse  Patient Instructions  blood tests are requested for you today.  We'll let you know about the results. Please also see the dietician specialist Please come back for a follow-up appointment in 1 year.

## 2018-07-31 ENCOUNTER — Ambulatory Visit: Payer: 59 | Admitting: Dietician

## 2018-08-17 ENCOUNTER — Other Ambulatory Visit: Payer: Self-pay

## 2018-08-17 ENCOUNTER — Ambulatory Visit: Payer: 59 | Admitting: Family Medicine

## 2018-08-31 ENCOUNTER — Emergency Department (HOSPITAL_COMMUNITY): Payer: 59

## 2018-08-31 ENCOUNTER — Other Ambulatory Visit: Payer: Self-pay

## 2018-08-31 ENCOUNTER — Encounter (HOSPITAL_COMMUNITY): Payer: Self-pay

## 2018-08-31 ENCOUNTER — Emergency Department (HOSPITAL_COMMUNITY)
Admission: EM | Admit: 2018-08-31 | Discharge: 2018-08-31 | Disposition: A | Discharge status: Home / Self Care | Payer: 59 | Attending: Emergency Medicine | Admitting: Emergency Medicine

## 2018-08-31 DIAGNOSIS — Y929 Unspecified place or not applicable: Secondary | ICD-10-CM | POA: Insufficient documentation

## 2018-08-31 DIAGNOSIS — Y999 Unspecified external cause status: Secondary | ICD-10-CM | POA: Insufficient documentation

## 2018-08-31 DIAGNOSIS — S99912A Unspecified injury of left ankle, initial encounter: Secondary | ICD-10-CM | POA: Diagnosis present

## 2018-08-31 DIAGNOSIS — W1842XA Slipping, tripping and stumbling without falling due to stepping into hole or opening, initial encounter: Secondary | ICD-10-CM | POA: Diagnosis not present

## 2018-08-31 DIAGNOSIS — Y939 Activity, unspecified: Secondary | ICD-10-CM | POA: Insufficient documentation

## 2018-08-31 DIAGNOSIS — Z79899 Other long term (current) drug therapy: Secondary | ICD-10-CM | POA: Insufficient documentation

## 2018-08-31 DIAGNOSIS — S93422A Sprain of deltoid ligament of left ankle, initial encounter: Secondary | ICD-10-CM | POA: Diagnosis not present

## 2018-08-31 DIAGNOSIS — Y9301 Activity, walking, marching and hiking: Secondary | ICD-10-CM | POA: Diagnosis not present

## 2018-08-31 DIAGNOSIS — E039 Hypothyroidism, unspecified: Secondary | ICD-10-CM | POA: Diagnosis not present

## 2018-08-31 NOTE — Discharge Instructions (Addendum)
You were evaluated for an injury to your left ankle after a fall.  The x-rays did not show an obvious fracture of your foot or ankle.  We are placing you in an ankle splint and crutches to keep your foot elevated.  Ice and ibuprofen.  He will likely need to be not weightbearing for a couple of days and then can start slowly adding some more weight as tolerated.  Follow-up with your doctor and return if any worsening symptoms.

## 2018-08-31 NOTE — ED Triage Notes (Signed)
Pt endorses taking a wrong step and twist left ankle yesterday, swelling noted to top of foot. PMS intact. VSS.

## 2018-08-31 NOTE — ED Provider Notes (Signed)
[facility] EMERGENCY DEPARTMENT Provider Note   CSN: [id number] Arrival date & time: 08/31/18  4098     History   Chief Complaint Chief Complaint  Patient presents with  . Ankle Pain    HPI Kristy Wright is a 27 y.o. female.  27 year old female complaining of left ankle pain after walking yesterday and stepping in a hole and twisting her ankle.  She is complaining of moderate to severe pain increased with weightbearing over the medial side of her ankle and across her midfoot.  Does not associate with any numbness.  She is been ambulatory with a limp.  He denies other injuries.  She is tried nothing for it.  The history is provided by the patient.  Ankle Pain   The incident occurred yesterday. The incident occurred in the yard. The injury mechanism was a fall. The pain is present in the left ankle and left foot. The quality of the pain is described as sharp. The pain is moderate. The pain has been constant since onset. Pertinent negatives include no numbness, no inability to bear weight, no loss of sensation and no tingling. She reports no foreign bodies present. The symptoms are aggravated by activity, bearing weight and palpation. She has tried rest for the symptoms. The treatment provided mild relief.    Past Medical History:  Diagnosis Date  . HYPOTHYROIDISM 03/13/2009  . Pyelonephritis   . Thyroid activity decreased   . Thyroid activity decreased     Patient Active Problem List   Diagnosis Date Noted  . Weight gain 07/07/2018  . Hypertriglyceridemia 06/12/2017  . Back pain, chronic 08/26/2013  . IUD (intrauterine device) in place 08/26/2013  . Thyroid activity decreased   . Nontoxic multinodular goiter 04/06/2011  . Hypothyroidism 03/13/2009  . UTI 03/13/2009    History reviewed. No pertinent surgical history.   OB History    Gravida  5   Para  5   Term  5   Preterm      AB      Living  5     SAB      TAB      Ectopic      Multiple      Live Births  5            Home Medications    Prior to Admission medications   Medication Sig Start Date End Date Taking? Authorizing Provider  escitalopram (LEXAPRO) 20 MG tablet Take 1 tablet by mouth daily.    [provider]  levonorgestrel (MIRENA) 20 MCG/24HR IUD 1 each by Intrauterine route once.    [provider]  levothyroxine (SYNTHROID, LEVOTHROID) 175 MCG tablet Take 1 tablet (175 mcg total) by mouth daily before breakfast. 07/07/18   Romero Belling, MD  ondansetron (ZOFRAN ODT) 4 MG disintegrating tablet Take 1 tablet (4 mg total) by mouth every 8 (eight) hours as needed for nausea or vomiting. 03/30/18   Dietrich Pates, PA-C    Family History Family History  Problem Relation Age of Onset  . Thyroid disease Sister        uncertain type  . Hypertension Sister   . Stroke Other        Grandmother-CVA  . Cancer Mother        cervical  . Diabetes Maternal Grandmother   . Heart disease Maternal Grandmother   . Hypertension Maternal Grandmother   . Cancer Paternal Grandmother        breast  .  Hypertension Paternal Grandmother   . Diabetes Paternal Grandfather     Social History Social History   Tobacco Use  . Smoking status: Never Smoker  . Smokeless tobacco: Never Used  Substance Use Topics  . Alcohol use: No  . Drug use: No     Allergies   Patient has no known allergies.   Review of Systems Review of Systems  Constitutional: Negative for fever.  HENT: Negative for sore throat.   Respiratory: Negative for shortness of breath.   Cardiovascular: Negative for chest pain.  Gastrointestinal: Negative for abdominal pain.  Genitourinary: Negative for dysuria.  Musculoskeletal: Negative for neck pain.  Skin: Negative for rash.  Neurological: Negative for tingling and numbness.     Physical Exam Updated Vital Signs Pulse 64   Temp 98.5 F (36.9 C) (Oral)   Resp 16   Ht 5\' 4"  (1.626 m)   Wt 104.3 kg   SpO2 100%    BMI 39.48 kg/m   Physical Exam  Constitutional: She appears well-developed and well-nourished.  HENT:  Head: Normocephalic and atraumatic.  Eyes: Conjunctivae are normal.  Neck: Neck supple.  Musculoskeletal:  Left lower extremity nontender at hip and knee.  She is got no lateral malleolar tenderness.  She has some moderate medial malleolar tenderness and deltoid ligament tenderness.  She is tender throughout her midfoot.  No calcaneus or fifth metatarsal pain.  Moves digits easily and cap refill brisk.  No open wounds or skin changes.  Neurological: She is alert. GCS eye subscore is 4. GCS verbal subscore is 5. GCS motor subscore is 6.  Skin: Skin is warm and dry.  Psychiatric: She has a normal mood and affect.     ED Treatments / Results  Labs (all labs ordered are listed, but only abnormal results are displayed) Labs Reviewed - No data to display  EKG None  Radiology Dg Ankle Complete Left  Result Date: 08/31/2018 CLINICAL DATA:  27 year old female status post twisting injury yesterday. Pain and swelling. EXAM: LEFT ANKLE COMPLETE - 3+ VIEW COMPARISON:  Left foot series today. FINDINGS: Bone mineralization is within normal limits. Preserved mortise joint alignment. Talar dome intact. No fracture identified. Anterior soft tissue swelling. Questionable joint effusion on the lateral view. IMPRESSION: Anterior soft tissue swelling and questionable joint effusion. No acute fracture or dislocation identified. Electronically Signed   By: Odessa Fleming M.D.   On: 08/31/2018 11:00   Dg Foot Complete Left  Result Date: 08/31/2018 CLINICAL DATA:  27 year old female status post twisting injury yesterday. Pain and swelling. EXAM: LEFT FOOT - COMPLETE 3+ VIEW COMPARISON:  Left foot series 05/11/2012. FINDINGS: Bone mineralization is within normal limits. Healed chronic 5th metatarsal fracture, stable. No acute fracture or dislocation. Normal joint spaces and alignment. New mild degenerative spurring  at the calcaneus. No discrete soft tissue injury identified. IMPRESSION: No acute fracture or dislocation identified about the left foot. Electronically Signed   By: Odessa Fleming M.D.   On: 08/31/2018 10:59    Procedures Procedures (including critical care time)  Medications Ordered in ED Medications - No data to display   Initial Impression / Assessment and Plan / ED Course  I have reviewed the triage vital signs and the nursing notes.  Pertinent labs & imaging results that were available during my care of the patient were reviewed by me and considered in my medical decision making (see chart for details).  Clinical Course as of Sep 02 1439  Mon Aug 31, 2018  1124  X-rays are negative for fracture.  I reviewed this with the patient and she is comfortable with the plan of crutches and ankle brace.   [MB]    Clinical Course User Index [MB] Terrilee Files, MD      Final Clinical Impressions(s) / ED Diagnoses   Final diagnoses:  Sprain of deltoid ligament of left ankle, initial encounter    ED Discharge Orders    None       Terrilee Files, MD 09/01/18 1440

## 2018-10-27 ENCOUNTER — Telehealth: Payer: Self-pay | Admitting: Endocrinology

## 2018-10-27 NOTE — Telephone Encounter (Signed)
Called pt and made her aware. States she MAY stop by our office tomorrow to have her blood work completed.

## 2018-10-27 NOTE — Telephone Encounter (Signed)
Patient has called stating they used to see you for Primary. Patient is currently requesting to see you for some symptoms she is having and believe it is being caused by her Thyroid. Patient stated they have no energy, stay tired, sleeps through the night but feels like they never get any sleep, weak, and eyes are puffy. Will you be willing to see patient?

## 2018-10-27 NOTE — Telephone Encounter (Signed)
Please come in for blood test.  order is in epic

## 2018-10-28 ENCOUNTER — Other Ambulatory Visit (INDEPENDENT_AMBULATORY_CARE_PROVIDER_SITE_OTHER): Payer: 59

## 2018-10-28 ENCOUNTER — Encounter: Payer: Self-pay | Admitting: Endocrinology

## 2018-10-28 ENCOUNTER — Ambulatory Visit (INDEPENDENT_AMBULATORY_CARE_PROVIDER_SITE_OTHER): Payer: 59 | Admitting: Endocrinology

## 2018-10-28 DIAGNOSIS — H5789 Other specified disorders of eye and adnexa: Secondary | ICD-10-CM | POA: Diagnosis not present

## 2018-10-28 DIAGNOSIS — E039 Hypothyroidism, unspecified: Secondary | ICD-10-CM | POA: Diagnosis not present

## 2018-10-28 DIAGNOSIS — E069 Thyroiditis, unspecified: Secondary | ICD-10-CM | POA: Diagnosis not present

## 2018-10-28 LAB — TSH: TSH: 3.66 u[IU]/mL (ref 0.35–4.50)

## 2018-10-28 NOTE — Progress Notes (Signed)
Subjective:    Patient ID: Kristy Wright, female    DOB: 09-May-1991, 27 y.o.   MRN: [id number]  HPI Pt returns to f/u of chronic primary hypothyroidism (dx'ed 2009; [place] in 2014 showed enlarged and somewhat hypervascular thyroid, but no nodule; f/u US in 2016 was unchanged; she has a "Mirena" IUD).  She takes synthroid as rx'ed.  However, she reports swelling at both eyes, and assoc fatigue.  Past Medical History:  Diagnosis Date  . HYPOTHYROIDISM 03/13/2009  . Pyelonephritis   . Thyroid activity decreased   . Thyroid activity decreased     No past surgical history on file.  Social History   Socioeconomic History  . Marital status: Single    Spouse name: Not on file  . Number of children: 3  . Years of education: Not on file  . Highest education level: Not on file  Occupational History  . Not on file  Social Needs  . Financial resource strain: Not on file  . Food insecurity:    Worry: Not on file    Inability: Not on file  . Transportation needs:    Medical: Not on file    Non-medical: Not on file  Tobacco Use  . Smoking status: Never Smoker  . Smokeless tobacco: Never Used  Substance and Sexual Activity  . Alcohol use: No  . Drug use: No  . Sexual activity: Yes    Birth control/protection: None  Lifestyle  . Physical activity:    Days per week: Not on file    Minutes per session: Not on file  . Stress: Not on file  Relationships  . Social connections:    Talks on phone: Not on file    Gets together: Not on file    Attends religious service: Not on file    Active member of club or organization: Not on file    Attends meetings of clubs or organizations: Not on file    Relationship status: Not on file  . Intimate partner violence:    Fear of current or ex partner: Not on file    Emotionally abused: Not on file    Physically abused: Not on file    Forced sexual activity: Not on file  Other Topics Concern  . Not on file  Social History Narrative   At age 74  began living with her older sister. States her mom was never really involved and she doesn't get along with her father. States they were "crazy parents". Doesn't know their history. She has 3 sons born in 2006, 2007, and 2009. Works at [employer]. Completed 10th grade but did not graduate from high school.    Current Outpatient Medications on File Prior to Visit  Medication Sig Dispense Refill  . escitalopram (LEXAPRO) 20 MG tablet Take 1 tablet by mouth daily.    Marland Kitchen levonorgestrel (MIRENA) 20 MCG/24HR IUD 1 each by Intrauterine route once.    Marland Kitchen levothyroxine (SYNTHROID, LEVOTHROID) 175 MCG tablet Take 1 tablet (175 mcg total) by mouth daily before breakfast. 30 tablet 11   No current facility-administered medications on file prior to visit.     No Known Allergies  Family History  Problem Relation Age of Onset  . Thyroid disease Sister        uncertain type  . Hypertension Sister   . Stroke Other        Grandmother-CVA  . Cancer Mother        cervical  . Diabetes Maternal Grandmother   .  Heart disease Maternal Grandmother   . Hypertension Maternal Grandmother   . Cancer Paternal Grandmother        breast  . Hypertension Paternal Grandmother   . Diabetes Paternal Grandfather     BP 128/84 (BP Location: Left Arm, Patient Position: Sitting, Cuff Size: Normal)   Pulse 75   Ht 5\' 4"  (1.626 m)   Wt 238 lb 9.6 oz (108.2 kg)   SpO2 96%   BMI 40.96 kg/m    Review of Systems No leg swelling, but she has swelling of the ant neck.     Objective:   Physical Exam VITAL SIGNS:  See vs page GENERAL: no distress NECK: thyroid is slightly and diffusely enlarged.     Lab Results  Component Value Date   TSH 3.66 10/28/2018      Assessment & Plan:  Hypothyroidism: well-controlled.  Please continue the same medication. Eye symptoms: worse Neck swelling: persistent  Patient Instructions  Please continue the same medication. Let's recheck the ultrasound, and we'll also check the MRI  of the eyes.  you will receive a phone call, about a day and time for an appointment. Please come back for a follow-up appointment in 6 months.

## 2018-10-28 NOTE — Patient Instructions (Signed)
Please continue the same medication. Let's recheck the ultrasound, and we'll also check the MRI of the eyes.  you will receive a phone call, about a day and time for an appointment. Please come back for a follow-up appointment in 6 months.

## 2018-10-29 ENCOUNTER — Other Ambulatory Visit: Payer: Self-pay

## 2018-10-29 ENCOUNTER — Ambulatory Visit: Payer: 59 | Admitting: Family Medicine

## 2018-11-11 ENCOUNTER — Ambulatory Visit
Admission: RE | Admit: 2018-11-11 | Discharge: 2018-11-11 | Disposition: A | Discharge status: Home / Self Care | Payer: 59 | Source: Ambulatory Visit | Attending: Endocrinology | Admitting: Endocrinology

## 2018-11-11 ENCOUNTER — Encounter: Payer: Self-pay | Admitting: Endocrinology

## 2018-11-11 ENCOUNTER — Other Ambulatory Visit: Payer: Self-pay | Admitting: Endocrinology

## 2018-11-11 DIAGNOSIS — E069 Thyroiditis, unspecified: Secondary | ICD-10-CM

## 2018-11-11 DIAGNOSIS — H5789 Other specified disorders of eye and adnexa: Secondary | ICD-10-CM

## 2018-11-11 MED ORDER — GADOBENATE DIMEGLUMINE 529 MG/ML IV SOLN
20.0000 mL | Freq: Once | INTRAVENOUS | Status: AC | PRN
  Administered 2018-11-11: 20 mL via INTRAVENOUS

## 2018-11-11 NOTE — Telephone Encounter (Signed)
Please advise 

## 2018-11-12 ENCOUNTER — Encounter: Payer: Self-pay | Admitting: Endocrinology

## 2018-11-12 NOTE — Telephone Encounter (Signed)
Please advise 

## 2018-12-16 NOTE — Progress Notes (Signed)
Office Visit Note  Patient: Kristy Wright             Date of Birth: 1991-06-01           MRN: [id number]             PCP: System, Pcp Not In Referring: Ronney Lion, NP Visit Date: 12/17/2018 Occupation: secretary  Subjective:   muscle pain.   History of Present Illness: Kristy Wright is a 28 y.o. female seen in consultation per request of her PCP.  According to patient in 2009 well she was in the postpartum.  She was diagnosed with hypothyroidism.  She was experiencing increased fatigue and hair loss which improved after taking thyroid medication.  She states her symptoms got worse again in the last 1 year with increased fatigue puffiness in her eyes and increased arm pain.  She was seen by an endocrinologist and TSH was normal.  She was also seen by ophthalmology and the work-up was negative.  She went to see her PCP and the labs showed positive ANA for that reason she was referred to me.  She denies any joint pain or joint swelling.  She states the pain is mostly in her muscles in her arms.  Denies any history of oral ulcers nasal ulcers malar rash photosensitivity or rainouts phenomenon.  Activities of Daily Living:  Patient reports morning stiffness for several hours.   Patient Reports nocturnal pain.  Difficulty dressing/grooming: Denies Difficulty climbing stairs: Denies Difficulty getting out of chair: Denies Difficulty using hands for taps, buttons, cutlery, and/or writing: Denies  Review of Systems  Constitutional: Positive for fatigue.  HENT: Negative for mouth sores, trouble swallowing, trouble swallowing, mouth dryness and nose dryness.   Eyes: Negative for pain, redness, itching and dryness.  Respiratory: Negative for shortness of breath, wheezing and difficulty breathing.   Cardiovascular: Negative for chest pain, hypertension and irregular heartbeat.  Gastrointestinal: Negative for abdominal pain, blood in stool, constipation and diarrhea.  Endocrine: Negative  for increased urination.  Genitourinary: Negative for painful urination, nocturia and pelvic pain.  Musculoskeletal: Positive for muscle weakness, morning stiffness and muscle tenderness. Negative for arthralgias, joint pain and joint swelling.  Skin: Negative for rash, hair loss and redness.  Allergic/Immunologic: Negative for susceptible to infections.  Neurological: Negative for dizziness, light-headedness, headaches, memory loss and weakness.  Hematological: Negative for bruising/bleeding tendency.  Psychiatric/Behavioral: Positive for confusion. The patient is nervous/anxious.     PMFS History:  Patient Active Problem List   Diagnosis Date Noted  . Eye swelling, bilateral 10/28/2018  . Weight gain 07/07/2018  . Hypertriglyceridemia 06/12/2017  . Back pain, chronic 08/26/2013  . IUD (intrauterine device) in place 08/26/2013  . Thyroid activity decreased   . Nontoxic multinodular goiter 04/06/2011  . Hypothyroidism 03/13/2009  . UTI 03/13/2009    Past Medical History:  Diagnosis Date  . HYPOTHYROIDISM 03/13/2009  . Pyelonephritis   . Thyroid activity decreased   . Thyroid activity decreased     Family History  Problem Relation Age of Onset  . Thyroid disease Sister        uncertain type  . Hypertension Sister   . Stroke Other        Grandmother-CVA  . Diabetes Maternal Grandmother   . Heart disease Maternal Grandmother   . Hypertension Maternal Grandmother   . Cancer Paternal Grandmother        breast  . Hypertension Paternal Grandmother   . Arthritis Paternal Grandmother   .  Thyroid disease Paternal Grandmother   . Diabetes Paternal Grandfather   . Liver disease Father   . Hypertension Father   . Diabetes Father   . Thyroid disease Brother   . Healthy Son   . Healthy Son   . Healthy Son   . Healthy Son   . Healthy Daughter    History reviewed. No pertinent surgical history. Social History   Social History Narrative   At age 28 began living with her older  sister. States her mom was never really involved and she doesn't get along with her father. States they were "crazy parents". Doesn't know their history. She has 3 sons born in 2006, 2007, and 2009. Works at [employer]. Completed 10th grade but did not graduate from high school.   Immunization History  Administered Date(s) Administered  . Influenza Split 08/26/2013     Objective: Vital Signs: BP 114/63 (BP Location: Right Arm, Patient Position: Sitting, Cuff Size: Normal)   Pulse 74   Resp 12   Ht 5\' 3"  (1.6 m)   Wt 232 lb (105.2 kg)   BMI 41.10 kg/m    Physical Exam Vitals signs and nursing note reviewed.  Constitutional:      Appearance: She is well-developed.  HENT:     Head: Normocephalic and atraumatic.  Eyes:     Conjunctiva/sclera: Conjunctivae normal.  Neck:     Musculoskeletal: Normal range of motion.  Cardiovascular:     Rate and Rhythm: Normal rate and regular rhythm.     Heart sounds: Normal heart sounds.  Pulmonary:     Effort: Pulmonary effort is normal.     Breath sounds: Normal breath sounds.  Abdominal:     General: Bowel sounds are normal.     Palpations: Abdomen is soft.  Lymphadenopathy:     Cervical: No cervical adenopathy.  Skin:    General: Skin is warm and dry.     Capillary Refill: Capillary refill takes less than 2 seconds.  Neurological:     Mental Status: She is alert and oriented to person, place, and time.  Psychiatric:        Behavior: Behavior normal.      Musculoskeletal Exam: C-spine thoracic lumbar spine good range of motion.  Shoulder joints elbow joints wrist joint MCPs PIPs DIPs been good range of motion with no synovitis.  Hip joints knee joints ankles MTPs PIPs were in good range of motion with no synovitis.  CDAI Exam: CDAI Score: Not documented Patient Global Assessment: Not documented; Provider Global Assessment: Not documented Swollen: Not documented; Tender: Not documented Joint Exam   Not documented   There is  currently no information documented on the homunculus. Go to the Rheumatology activity and complete the homunculus joint exam.  Investigation: No additional findings.  Imaging: No results found.  Recent Labs: Lab Results  Component Value Date   WBC 10.4 03/30/2018   HGB 14.2 03/30/2018   PLT 260 03/30/2018   NA 135 03/30/2018   K 3.8 03/30/2018   CL 102 03/30/2018   CO2 22 03/30/2018   GLUCOSE 104 (H) 03/30/2018   BUN 14 03/30/2018   CREATININE 0.68 03/30/2018   BILITOT 1.1 03/30/2018   ALKPHOS 94 03/30/2018   AST 21 03/30/2018   ALT 25 03/30/2018   PROT 8.0 03/30/2018   ALBUMIN 4.2 03/30/2018   CALCIUM 9.7 03/30/2018   GFRAA >60 03/30/2018    Speciality Comments: No specialty comments available.  Procedures:  No procedures performed Allergies: Patient has no  known allergies.   Assessment / Plan:     Visit Diagnoses: Positive ANA (antinuclear antibody) - 11/05/18:ANA 1:80 Mitotic, sed rate 11, RF <14, lyme ab screen negative, -patient gives history of fatigue and myalgias.  She has no other clinical features of autoimmune disease on examination.  I will obtain following labs to complete the work-up.  Plan: Urinalysis, Routine w reflex microscopic, ANA, Anti-scleroderma antibody, RNP Antibody, Anti-Smith antibody, Sjogrens syndrome-A extractable nuclear antibody, Sjogrens syndrome-B extractable nuclear antibody, Anti-DNA antibody, double-stranded, C3 and C4, Beta-2 glycoprotein antibodies, Cardiolipin antibodies, IgG, IgM, IgA, Lupus Anticoagulant Eval w/Reflex  Polyarthralgia -no synovitis was noted on examination.  Plan: Cyclic citrul peptide antibody, IgG  Other fatigue - Plan: CBC with Differential/Platelet, COMPLETE METABOLIC PANEL WITH GFR, CK, VITAMIN D 25 Hydroxy (Vit-D Deficiency, Fractures), Epstein-Barr virus VCA antibody panel, Serum protein electrophoresis with reflex, Glucose 6 phosphate dehydrogenase  History of neutropenia -all her previous WBC counts were  normal.  It raises concern about possible viral infection.  I will repeat CBC today.  WBC count 2.1, absolute neutro 590 on 11/05/18  Nontoxic multinodular goiter-she has palpable goiter which can also cause positive ANA.  History of hypothyroidism-patient reports her most recent TSH was normal.  Hypertriglyceridemia   Orders: Orders Placed This Encounter  Procedures  . CBC with Differential/Platelet  . COMPLETE METABOLIC PANEL WITH GFR  . Urinalysis, Routine w reflex microscopic  . CK  . VITAMIN D 25 Hydroxy (Vit-D Deficiency, Fractures)  . Cyclic citrul peptide antibody, IgG  . ANA  . Anti-scleroderma antibody  . RNP Antibody  . Anti-Smith antibody  . Sjogrens syndrome-A extractable nuclear antibody  . Sjogrens syndrome-B extractable nuclear antibody  . Anti-DNA antibody, double-stranded  . C3 and C4  . Beta-2 glycoprotein antibodies  . Cardiolipin antibodies, IgG, IgM, IgA  . Lupus Anticoagulant Eval w/Reflex  . Epstein-Barr virus VCA antibody panel  . Serum protein electrophoresis with reflex  . Glucose 6 phosphate dehydrogenase   No orders of the defined types were placed in this encounter.   Face-to-face time spent with patient was 45 minutes. Greater than 50% of time was spent in counseling and coordination of care.  Follow-Up Instructions: Return for Positive ANA, myalgia.   Pollyann SavoyShaili Deveshwar, MD  Note - This record has been created using Animal nutritionistDragon software.  Chart creation errors have been sought, but may not always  have been located. Such creation errors do not reflect on  the standard of medical care.

## 2018-12-17 ENCOUNTER — Ambulatory Visit (INDEPENDENT_AMBULATORY_CARE_PROVIDER_SITE_OTHER): Payer: BLUE CROSS/BLUE SHIELD | Admitting: Rheumatology

## 2018-12-17 ENCOUNTER — Encounter: Payer: Self-pay | Admitting: Rheumatology

## 2018-12-17 VITALS — BP 114/63 | HR 74 | Resp 12 | Ht 63.0 in | Wt 232.0 lb

## 2018-12-17 DIAGNOSIS — R768 Other specified abnormal immunological findings in serum: Secondary | ICD-10-CM

## 2018-12-17 DIAGNOSIS — R5383 Other fatigue: Secondary | ICD-10-CM | POA: Diagnosis not present

## 2018-12-17 DIAGNOSIS — Z862 Personal history of diseases of the blood and blood-forming organs and certain disorders involving the immune mechanism: Secondary | ICD-10-CM | POA: Diagnosis not present

## 2018-12-17 DIAGNOSIS — E042 Nontoxic multinodular goiter: Secondary | ICD-10-CM

## 2018-12-17 DIAGNOSIS — M2550 Pain in unspecified joint: Secondary | ICD-10-CM | POA: Diagnosis not present

## 2018-12-17 DIAGNOSIS — E781 Pure hyperglyceridemia: Secondary | ICD-10-CM

## 2018-12-17 DIAGNOSIS — Z8639 Personal history of other endocrine, nutritional and metabolic disease: Secondary | ICD-10-CM

## 2018-12-18 ENCOUNTER — Other Ambulatory Visit: Payer: Self-pay

## 2018-12-18 ENCOUNTER — Encounter: Payer: Self-pay | Admitting: Rheumatology

## 2018-12-18 DIAGNOSIS — E559 Vitamin D deficiency, unspecified: Secondary | ICD-10-CM

## 2018-12-18 MED ORDER — VITAMIN D (ERGOCALCIFEROL) 1.25 MG (50000 UNIT) PO CAPS: 50000.0000 [IU] | ORAL_CAPSULE | ORAL | 0 refills | Status: DC

## 2018-12-21 ENCOUNTER — Encounter: Payer: Self-pay | Admitting: Rheumatology

## 2018-12-21 NOTE — Progress Notes (Signed)
If patient has UTI symptoms she should see her PCP.

## 2018-12-22 LAB — LUPUS ANTICOAGULANT EVAL W/ REFLEX
PTT-LA Screen: 32 s (ref <=40)
dRVVT: 42 s (ref <=45)

## 2018-12-22 LAB — CBC WITH DIFFERENTIAL/PLATELET
Absolute Monocytes: 442 cells/uL (ref 200–950)
Basophils Absolute: 20 cells/uL (ref 0–200)
Basophils Relative: 0.3 %
EOS PCT: 1.7 %
Eosinophils Absolute: 111 cells/uL (ref 15–500)
HCT: 40.7 % (ref 35.0–45.0)
HEMOGLOBIN: 13.5 g/dL (ref 11.7–15.5)
Lymphs Abs: 2275 cells/uL (ref 850–3900)
MCH: 29 pg (ref 27.0–33.0)
MCHC: 33.2 g/dL (ref 32.0–36.0)
MCV: 87.5 fL (ref 80.0–100.0)
MPV: 11.6 fL (ref 7.5–12.5)
Monocytes Relative: 6.8 %
Neutro Abs: 3653 cells/uL (ref 1500–7800)
Neutrophils Relative %: 56.2 %
Platelets: 192 10*3/uL (ref 140–400)
RBC: 4.65 10*6/uL (ref 3.80–5.10)
RDW: 13 % (ref 11.0–15.0)
Total Lymphocyte: 35 %
WBC: 6.5 10*3/uL (ref 3.8–10.8)

## 2018-12-22 LAB — COMPLETE METABOLIC PANEL WITH GFR
AG Ratio: 1.4 (calc) (ref 1.0–2.5)
ALT: 21 U/L (ref 6–29)
AST: 18 U/L (ref 10–30)
Albumin: 4.7 g/dL (ref 3.6–5.1)
Alkaline phosphatase (APISO): 79 U/L (ref 33–115)
BUN: 17 mg/dL (ref 7–25)
CO2: 28 mmol/L (ref 20–32)
CREATININE: 0.83 mg/dL (ref 0.50–1.10)
Calcium: 9.7 mg/dL (ref 8.6–10.2)
Chloride: 104 mmol/L (ref 98–110)
GFR, Est African American: 110 mL/min/{1.73_m2} (ref >=60)
GFR, Est Non African American: 95 mL/min/{1.73_m2} (ref >=60)
Globulin: 3.3 g/dL (calc) (ref 1.9–3.7)
Glucose, Bld: 79 mg/dL (ref 65–99)
Potassium: 3.9 mmol/L (ref 3.5–5.3)
Sodium: 140 mmol/L (ref 135–146)
Total Bilirubin: 0.6 mg/dL (ref 0.2–1.2)
Total Protein: 8 g/dL (ref 6.1–8.1)

## 2018-12-22 LAB — URINALYSIS, ROUTINE W REFLEX MICROSCOPIC
BILIRUBIN URINE: NEGATIVE
Glucose, UA: NEGATIVE
Hgb urine dipstick: NEGATIVE
Hyaline Cast: NONE SEEN /LPF
KETONES UR: NEGATIVE
Nitrite: NEGATIVE
Protein, ur: NEGATIVE
RBC / HPF: NONE SEEN /HPF (ref 0–2)
Specific Gravity, Urine: 1.027 (ref 1.001–1.03)
pH: 5.5 (ref 5.0–8.0)

## 2018-12-22 LAB — BETA-2 GLYCOPROTEIN ANTIBODIES
Beta-2 Glyco 1 IgA: <9 SAU (ref <=20)
Beta-2 Glyco I IgG: <9 SGU (ref <=20)

## 2018-12-22 LAB — SJOGRENS SYNDROME-A EXTRACTABLE NUCLEAR ANTIBODY: SSA (RO) (ENA) ANTIBODY, IGG: NEGATIVE AI

## 2018-12-22 LAB — SJOGRENS SYNDROME-B EXTRACTABLE NUCLEAR ANTIBODY: SSB (La) (ENA) Antibody, IgG: <1 AI

## 2018-12-22 LAB — C3 AND C4
C3 Complement: 169 mg/dL (ref 83–193)
C4 Complement: 26 mg/dL (ref 15–57)

## 2018-12-22 LAB — GLUCOSE 6 PHOSPHATE DEHYDROGENASE: G-6PDH: 16.9 U/g Hgb (ref 7.0–20.5)

## 2018-12-22 LAB — ANA: Anti Nuclear Antibody(ANA): POSITIVE — AB

## 2018-12-22 LAB — EPSTEIN-BARR VIRUS VCA ANTIBODY PANEL
EBV NA IgG: 39 U/mL — ABNORMAL HIGH
EBV VCA IgG: 96.5 U/mL — ABNORMAL HIGH
EBV VCA IgM: <36 U/mL

## 2018-12-22 LAB — CARDIOLIPIN ANTIBODIES, IGG, IGM, IGA
Anticardiolipin IgA: <11 [APL'U]
Anticardiolipin IgG: <14 [GPL'U]

## 2018-12-22 LAB — PROTEIN ELECTROPHORESIS, SERUM, WITH REFLEX
ALPHA 1: 0.3 g/dL (ref 0.2–0.3)
Albumin ELP: 4.5 g/dL (ref 3.8–4.8)
Alpha 2: 0.8 g/dL (ref 0.5–0.9)
Beta 2: 0.3 g/dL (ref 0.2–0.5)
Beta Globulin: 0.4 g/dL (ref 0.4–0.6)
GAMMA GLOBULIN: 1.6 g/dL (ref 0.8–1.7)
Total Protein: 7.9 g/dL (ref 6.1–8.1)

## 2018-12-22 LAB — ANTI-NUCLEAR AB-TITER (ANA TITER): ANA Titer 1: 1:80 {titer} — ABNORMAL HIGH

## 2018-12-22 LAB — ANTI-SCLERODERMA ANTIBODY: SCLERODERMA (SCL-70) (ENA) ANTIBODY, IGG: NEGATIVE AI

## 2018-12-22 LAB — RNP ANTIBODY: Ribonucleic Protein(ENA) Antibody, IgG: <1 AI

## 2018-12-22 LAB — CK: Total CK: 164 U/L — ABNORMAL HIGH (ref 29–143)

## 2018-12-22 LAB — ANTI-DNA ANTIBODY, DOUBLE-STRANDED: ds DNA Ab: 2 IU/mL

## 2018-12-22 LAB — CYCLIC CITRUL PEPTIDE ANTIBODY, IGG: Cyclic Citrullin Peptide Ab: <16 UNITS

## 2018-12-22 LAB — ANTI-SMITH ANTIBODY: ENA SM Ab Ser-aCnc: <1 AI

## 2018-12-22 LAB — VITAMIN D 25 HYDROXY (VIT D DEFICIENCY, FRACTURES): Vit D, 25-Hydroxy: 22 ng/mL — ABNORMAL LOW (ref 30–100)

## 2018-12-22 NOTE — Telephone Encounter (Signed)
She has a follow-up appointment coming up.  We can schedule an earlier appointment if there are any cancellations or open spots.

## 2018-12-22 NOTE — Telephone Encounter (Signed)
Kristy Wright spoke with patient and reviewed lab results on 12/21/18.

## 2018-12-24 DIAGNOSIS — E559 Vitamin D deficiency, unspecified: Secondary | ICD-10-CM | POA: Insufficient documentation

## 2018-12-24 NOTE — Progress Notes (Signed)
Office Visit Note  Patient: Kristy Wright             Date of Birth: [birth year]           MRN: [id number]             PCP: System, Pcp Not In Referring: No ref. provider found Visit Date: 12/30/2018 Occupation: Investment banker, corporate  Subjective:  Generalized pain.   History of Present Illness: Kristy Wright is a 28 y.o. female with history of generalized pain.  She states the pain is mostly in her bones in her muscles but not in the joints.  She denies any history of joint swelling.  She states she has intermittent discomfort in her abdomen.  She has mild dysuria.  Activities of Daily Living:  Patient reports morning stiffness for 5 minutes.   Patient Reports nocturnal pain.  Difficulty dressing/grooming: Denies Difficulty climbing stairs: Denies Difficulty getting out of chair: Denies Difficulty using hands for taps, buttons, cutlery, and/or writing: Denies  Review of Systems  Constitutional: Positive for fatigue. Negative for night sweats, weight gain and weight loss.  HENT: Negative for mouth sores, trouble swallowing, trouble swallowing, mouth dryness and nose dryness.   Eyes: Negative for pain, redness, visual disturbance and dryness.  Respiratory: Negative for cough, shortness of breath and difficulty breathing.   Cardiovascular: Negative for chest pain, palpitations, hypertension, irregular heartbeat and swelling in legs/feet.  Gastrointestinal: Negative for blood in stool, constipation and diarrhea.  Endocrine: Negative for increased urination.  Genitourinary: Negative for vaginal dryness.  Musculoskeletal: Positive for arthralgias, joint pain, myalgias, morning stiffness and myalgias. Negative for joint swelling, muscle weakness and muscle tenderness.  Skin: Negative for color change, rash, hair loss, skin tightness, ulcers and sensitivity to sunlight.  Allergic/Immunologic: Negative for susceptible to infections.  Neurological: Negative for dizziness, memory loss, night  sweats and weakness.  Hematological: Negative for swollen glands.  Psychiatric/Behavioral: Positive for sleep disturbance. Negative for depressed mood. The patient is not nervous/anxious.     PMFS History:  Patient Active Problem List   Diagnosis Date Noted  . Vitamin D deficiency 12/24/2018  . Eye swelling, bilateral 10/28/2018  . Weight gain 07/07/2018  . Hypertriglyceridemia 06/12/2017  . Back pain, chronic 08/26/2013  . IUD (intrauterine device) in place 08/26/2013  . Thyroid activity decreased   . Nontoxic multinodular goiter 04/06/2011  . Hypothyroidism 03/13/2009  . UTI 03/13/2009    Past Medical History:  Diagnosis Date  . HYPOTHYROIDISM 03/13/2009  . Pyelonephritis   . Thyroid activity decreased   . Thyroid activity decreased     Family History  Problem Relation Age of Onset  . Thyroid disease Sister        uncertain type  . Hypertension Sister   . Stroke Other        Grandmother-CVA  . Diabetes Maternal Grandmother   . Heart disease Maternal Grandmother   . Hypertension Maternal Grandmother   . Cancer Paternal Grandmother        breast  . Hypertension Paternal Grandmother   . Arthritis Paternal Grandmother   . Thyroid disease Paternal Grandmother   . Diabetes Paternal Grandfather   . Liver disease Father   . Hypertension Father   . Diabetes Father   . Thyroid disease Brother   . Healthy Son   . Healthy Son   . Healthy Son   . Healthy Son   . Healthy Daughter    History reviewed. No pertinent surgical history. Social History  Social History Narrative   At age 89 began living with her older sister. States her mom was never really involved and she doesn't get along with her father. States they were "crazy parents". Doesn't know their history. She has 3 sons born in 2006, 2007, and 2009. Works at [employer]. Completed 10th grade but did not graduate from high school.   Immunization History  Administered Date(s) Administered  . Influenza Split 08/26/2013       Objective: Vital Signs: BP 115/76 (BP Location: Left Arm, Patient Position: Sitting, Cuff Size: Large)   Pulse (!) 58   Resp 14   Ht 5\' 3"  (1.6 m)   Wt 233 lb 6.4 oz (105.9 kg)   BMI 41.34 kg/m    Physical Exam Vitals signs and nursing note reviewed.  Constitutional:      Appearance: She is well-developed.  HENT:     Head: Normocephalic and atraumatic.  Eyes:     Conjunctiva/sclera: Conjunctivae normal.  Neck:     Musculoskeletal: Normal range of motion.  Cardiovascular:     Rate and Rhythm: Normal rate and regular rhythm.     Heart sounds: Normal heart sounds.  Pulmonary:     Effort: Pulmonary effort is normal.     Breath sounds: Normal breath sounds.  Abdominal:     General: Bowel sounds are normal.     Palpations: Abdomen is soft.  Lymphadenopathy:     Cervical: No cervical adenopathy.  Skin:    General: Skin is warm and dry.     Capillary Refill: Capillary refill takes less than 2 seconds.  Neurological:     Mental Status: She is alert and oriented to person, place, and time.  Psychiatric:        Behavior: Behavior normal.      Musculoskeletal Exam: C-spine thoracic lumbar spine good range of motion.  Shoulder joints elbow joints wrist joint MCPs PIPs DIPs been good range of motion with no synovitis.  She has some tenderness over the deltoid insertion and over lateral epicondyle area.  She also had some tenderness over bilateral trochanteric bursa.  Hip joints knee joints ankles MTPs PIPs been good range of motion with no synovitis.  CDAI Exam: CDAI Score: Not documented Patient Global Assessment: Not documented; Provider Global Assessment: Not documented Swollen: Not documented; Tender: Not documented Joint Exam   Not documented   There is currently no information documented on the homunculus. Go to the Rheumatology activity and complete the homunculus joint exam.  Investigation: No additional findings.  Imaging: No results found.  Recent  Labs: Lab Results  Component Value Date   WBC 6.5 12/17/2018   HGB 13.5 12/17/2018   PLT 192 12/17/2018   NA 140 12/17/2018   K 3.9 12/17/2018   CL 104 12/17/2018   CO2 28 12/17/2018   GLUCOSE 79 12/17/2018   BUN 17 12/17/2018   CREATININE 0.83 12/17/2018   BILITOT 0.6 12/17/2018   ALKPHOS 94 03/30/2018   AST 18 12/17/2018   ALT 21 12/17/2018   PROT 8.0 12/17/2018   PROT 7.9 12/17/2018   ALBUMIN 4.2 03/30/2018   CALCIUM 9.7 12/17/2018   GFRAA 110 12/17/2018  SPEP negative, CK 164, vitamin D 22, G6PD normal Lupus anticoagulant negative, beta-2 negative, anticardiolipin negative, C3-C4 normal, ANA 1: 80 mitotic spindle fibers, ENA negative, anti-CCP negative EBV IgG positive, EBV IgM negative, UA showed 2+ WBCs and few bacteria  Speciality Comments: No specialty comments available.  Procedures:  No procedures performed Allergies: Patient has  no known allergies.   Assessment / Plan:     Visit Diagnoses: Positive ANA (antinuclear antibody) - ANA 1: 80 mitotic pattern, ENA negative, anti-CCP negative, all autoimmune work-up negative.  I detailed discussion regarding lab work with the patient.  All autoimmune work-up is negative.  She had no synovitis on examination.  Myofascial pain-she experiences generalized pain positive tender points.  She complains of bone pain and muscle pain.  I believe she has underlying myofascial pain.  Need for regular exercise and good night sleep was discussed.  Have encouraged her to join water aerobics and yoga classes for stretching.  If her symptoms persist she may discuss use of Cymbalta with her PCP.  Other fatigue-she complains of extreme fatigue which could be related to myofascial pain syndrome.    Insomnia - Good sleep hygiene was discussed.  Vitamin D deficiency-she was placed on vitamin D 50,000 units once a week.  We will check vitamin D level in 3 months.  History of hypothyroidism-last TSH in December was normal.  Dysuria -she had  some dysuria.  She had few bacteria and white cells in her urine.  Urine culture today.  Plan: Urine Culture   Orders: Orders Placed This Encounter  Procedures  . Urine Culture   No orders of the defined types were placed in this encounter.   Face-to-face time spent with patient was 30 minutes. Greater than 50% of time was spent in counseling and coordination of care.  Follow-Up Instructions: Return in about 3 months (around 03/30/2019) for Positive ANA, myofascial pain.   Pollyann Savoy, MD  Note - This record has been created using Animal nutritionist.  Chart creation errors have been sought, but may not always  have been located. Such creation errors do not reflect on  the standard of medical care.

## 2018-12-30 ENCOUNTER — Encounter: Payer: Self-pay | Admitting: Rheumatology

## 2018-12-30 ENCOUNTER — Ambulatory Visit (INDEPENDENT_AMBULATORY_CARE_PROVIDER_SITE_OTHER): Payer: BLUE CROSS/BLUE SHIELD | Admitting: Rheumatology

## 2018-12-30 VITALS — BP 115/76 | HR 58 | Resp 14 | Ht 63.0 in | Wt 233.4 lb

## 2018-12-30 DIAGNOSIS — R300 Dysuria: Secondary | ICD-10-CM

## 2018-12-30 DIAGNOSIS — R5383 Other fatigue: Secondary | ICD-10-CM | POA: Diagnosis not present

## 2018-12-30 DIAGNOSIS — R768 Other specified abnormal immunological findings in serum: Secondary | ICD-10-CM | POA: Diagnosis not present

## 2018-12-30 DIAGNOSIS — M7918 Myalgia, other site: Secondary | ICD-10-CM | POA: Diagnosis not present

## 2018-12-30 DIAGNOSIS — E559 Vitamin D deficiency, unspecified: Secondary | ICD-10-CM | POA: Diagnosis not present

## 2018-12-30 DIAGNOSIS — Z8639 Personal history of other endocrine, nutritional and metabolic disease: Secondary | ICD-10-CM

## 2018-12-30 DIAGNOSIS — G4709 Other insomnia: Secondary | ICD-10-CM

## 2018-12-31 ENCOUNTER — Ambulatory Visit: Payer: Self-pay | Admitting: Rheumatology

## 2018-12-31 LAB — URINE CULTURE
MICRO NUMBER:: 155979
SPECIMEN QUALITY:: ADEQUATE

## 2019-01-01 NOTE — Progress Notes (Signed)
Cx negative

## 2019-01-19 ENCOUNTER — Ambulatory Visit: Payer: Self-pay | Admitting: Rheumatology

## 2019-01-21 ENCOUNTER — Ambulatory Visit: Payer: Self-pay | Admitting: Rheumatology

## 2019-02-08 ENCOUNTER — Other Ambulatory Visit: Payer: Self-pay | Admitting: Endocrinology

## 2019-03-08 ENCOUNTER — Other Ambulatory Visit: Payer: Self-pay | Admitting: Rheumatology

## 2019-06-08 ENCOUNTER — Other Ambulatory Visit: Payer: Self-pay | Admitting: Endocrinology

## 2019-06-23 ENCOUNTER — Other Ambulatory Visit: Payer: Self-pay | Admitting: Endocrinology

## 2019-07-09 ENCOUNTER — Ambulatory Visit: Payer: 59 | Admitting: Endocrinology

## 2019-08-13 ENCOUNTER — Encounter (HOSPITAL_COMMUNITY): Payer: Self-pay

## 2019-08-13 ENCOUNTER — Ambulatory Visit (HOSPITAL_COMMUNITY)
Admission: EM | Admit: 2019-08-13 | Discharge: 2019-08-13 | Disposition: A | Discharge status: Home / Self Care | Payer: BC Managed Care – PPO | Attending: Family Medicine | Admitting: Family Medicine

## 2019-08-13 DIAGNOSIS — H60391 Other infective otitis externa, right ear: Secondary | ICD-10-CM | POA: Diagnosis not present

## 2019-08-13 DIAGNOSIS — H6011 Cellulitis of right external ear: Secondary | ICD-10-CM

## 2019-08-13 MED ORDER — CIPROFLOXACIN-DEXAMETHASONE 0.3-0.1 % OT SUSP: 4.0000 [drp] | Freq: Two times a day (BID) | OTIC | 0 refills | Status: DC

## 2019-08-13 MED ORDER — CEPHALEXIN 500 MG PO CAPS: 500.0000 mg | ORAL_CAPSULE | Freq: Four times a day (QID) | ORAL | 0 refills | Status: DC

## 2019-08-13 NOTE — Discharge Instructions (Addendum)
Treating with oral antibiotics and eardrops. Use the medication as prescribed.  Please follow-up for any continued or worsening problems.  You can take ibuprofen for pain

## 2019-08-13 NOTE — ED Triage Notes (Signed)
Patient report having bilateral ear pain for 2 days, she's been taking Ibuprofen.

## 2019-08-14 ENCOUNTER — Other Ambulatory Visit: Payer: Self-pay | Admitting: Endocrinology

## 2019-08-14 NOTE — ED Provider Notes (Signed)
[facility]    CSN: [id number] Arrival date & time: 08/13/19  1527      History   Chief Complaint Chief Complaint  Patient presents with  . Otalgia    HPI Kristy Wright is a 28 y.o. female.   Patient is a 28 year old female with past medical history of hypothyroidism, pyelonephritis.  She presents today with severe right ear pain.  There has been swelling to the ear.  Mild drainage.  She denies any fever but felt warm to touch last night.  She has been taking Tylenol ibuprofen without any relief.  No rhinorrhea, nasal congestion, cough. No swimming.   ROS per HPI      Past Medical History:  Diagnosis Date  . HYPOTHYROIDISM 03/13/2009  . Pyelonephritis   . Thyroid activity decreased   . Thyroid activity decreased     Patient Active Problem List   Diagnosis Date Noted  . Vitamin D deficiency 12/24/2018  . Eye swelling, bilateral 10/28/2018  . Weight gain 07/07/2018  . Hypertriglyceridemia 06/12/2017  . Back pain, chronic 08/26/2013  . IUD (intrauterine device) in place 08/26/2013  . Thyroid activity decreased   . Nontoxic multinodular goiter 04/06/2011  . Hypothyroidism 03/13/2009  . UTI 03/13/2009    History reviewed. No pertinent surgical history.  OB History    Gravida  5   Para  5   Term  5   Preterm      AB      Living  5     SAB      TAB      Ectopic      Multiple      Live Births  5            Home Medications    Prior to Admission medications   Medication Sig Start Date End Date Taking? Authorizing Provider  cephALEXin (KEFLEX) 500 MG capsule Take 1 capsule (500 mg total) by mouth 4 (four) times daily. 08/13/19   Dahlia Byes A, NP  ciprofloxacin-dexamethasone (CIPRODEX) OTIC suspension Place 4 drops into the right ear 2 (two) times daily. 08/13/19   Janace Aris, NP  levonorgestrel (MIRENA) 20 MCG/24HR IUD 1 each by Intrauterine route once.    [provider]  levothyroxine (SYNTHROID) 175 MCG tablet  TAKE 1 TABLET BY MOUTH DAILY BEFORE BREAKFAST. 06/08/19   Romero Belling, MD  Multiple Vitamins-Minerals (MULTIVITAMIN PO) Take by mouth daily.    [provider]  Vitamin D, Ergocalciferol, (DRISDOL) 1.25 MG (50000 UT) CAPS capsule Take 1 capsule (50,000 Units total) by mouth every 7 (seven) days. 12/18/18   Pollyann Savoy, MD    Family History Family History  Problem Relation Age of Onset  . Thyroid disease Sister        uncertain type  . Hypertension Sister   . Stroke Other        Grandmother-CVA  . Diabetes Maternal Grandmother   . Heart disease Maternal Grandmother   . Hypertension Maternal Grandmother   . Cancer Paternal Grandmother        breast  . Hypertension Paternal Grandmother   . Arthritis Paternal Grandmother   . Thyroid disease Paternal Grandmother   . Diabetes Paternal Grandfather   . Liver disease Father   . Hypertension Father   . Diabetes Father   . Thyroid disease Brother   . Healthy Son   . Healthy Son   . Healthy Son   . Healthy Son   . Healthy Daughter  Social History Social History   Tobacco Use  . Smoking status: Never Smoker  . Smokeless tobacco: Never Used  Substance Use Topics  . Alcohol use: Yes    Comment: occ  . Drug use: No     Allergies   Patient has no known allergies.   Review of Systems Review of Systems   Physical Exam Triage Vital Signs ED Triage Vitals  Enc Vitals Group     BP 08/13/19 1547 127/78     Pulse Rate 08/13/19 1547 69     Resp 08/13/19 1547 16     Temp 08/13/19 1547 97.9 F (36.6 C)     Temp Source 08/13/19 1547 Oral     SpO2 08/13/19 1547 99 %     Weight --      Height --      Head Circumference --      Peak Flow --      Pain Score 08/13/19 1556 10     Pain Loc --      Pain Edu? --      Excl. in [place]? --    No data found.  Updated Vital Signs BP 127/78 (BP Location: Left Arm)   Pulse 69   Temp 97.9 F (36.6 C) (Oral)   Resp 16   SpO2 99%   Visual Acuity Right Eye Distance:    Left Eye Distance:   Bilateral Distance:    Right Eye Near:   Left Eye Near:    Bilateral Near:     Physical Exam Constitutional:      General: She is not in acute distress.    Appearance: She is not ill-appearing, toxic-appearing or diaphoretic.     Comments: Appears in pain   HENT:     Head: Normocephalic.     Right Ear: Swelling and tenderness present. No mastoid tenderness.     Ears:     Comments: Right auricle swelling with closure of the ear canal. Unable to view TM. Mildly erythematous and tender to touch. No drainage.   Left ear normal with normal TM    Nose: Nose normal.  Eyes:     Conjunctiva/sclera: Conjunctivae normal.  Neck:     Musculoskeletal: Normal range of motion.  Pulmonary:     Effort: Pulmonary effort is normal.  Musculoskeletal: Normal range of motion.  Skin:    General: Skin is warm and dry.  Psychiatric:        Mood and Affect: Mood normal.      UC Treatments / Results  Labs (all labs ordered are listed, but only abnormal results are displayed) Labs Reviewed - No data to display  EKG   Radiology No results found.  Procedures Procedures (including critical care time)  Medications Ordered in UC Medications - No data to display  Initial Impression / Assessment and Plan / UC Course  I have reviewed the triage vital signs and the nursing notes.  Pertinent labs & imaging results that were available during my care of the patient were reviewed by me and considered in my medical decision making (see chart for details).     Treating for otitis externa and cellulitis of the right ear.  Treating with Ciprodex eardrops and Keflex Ibuprofen for pain and inflammation Follow up as needed for continued or worsening symptoms  Final Clinical Impressions(s) / UC Diagnoses   Final diagnoses:  Infective otitis externa of right ear  Cellulitis of auricle of right ear     Discharge Instructions  Treating with oral antibiotics and  eardrops. Use the medication as prescribed.  Please follow-up for any continued or worsening problems.  You can take ibuprofen for pain    ED Prescriptions    Medication Sig Dispense Auth. Provider   ciprofloxacin-dexamethasone (CIPRODEX) OTIC suspension Place 4 drops into the right ear 2 (two) times daily. 7.5 mL Virginia Francisco A, NP   cephALEXin (KEFLEX) 500 MG capsule Take 1 capsule (500 mg total) by mouth 4 (four) times daily. 28 capsule Scottie Stanish A, NP     PDMP not reviewed this encounter.   Dahlia ByesBast, Sophie Tamez A, NP 08/14/19 1037

## 2019-08-14 NOTE — Telephone Encounter (Signed)
1.  Please schedule f/u appt 2.  Then please refill x 1, pending that appt.  

## 2019-08-16 NOTE — Telephone Encounter (Signed)
Per Dr. Ellison, unable to refill Levothyroxine without an appt. Routing this message to the front desk for scheduling purposes.  

## 2019-08-17 ENCOUNTER — Other Ambulatory Visit: Payer: Self-pay

## 2019-08-17 NOTE — Telephone Encounter (Signed)
LVM to call back and reschedule appointment.

## 2019-08-20 ENCOUNTER — Ambulatory Visit: Payer: BC Managed Care – PPO | Admitting: Endocrinology

## 2019-08-20 DIAGNOSIS — Z0289 Encounter for other administrative examinations: Secondary | ICD-10-CM

## 2019-09-13 ENCOUNTER — Telehealth: Payer: Self-pay

## 2019-09-13 ENCOUNTER — Other Ambulatory Visit: Payer: Self-pay

## 2019-09-13 DIAGNOSIS — E039 Hypothyroidism, unspecified: Secondary | ICD-10-CM

## 2019-09-13 MED ORDER — LEVOTHYROXINE SODIUM 175 MCG PO TABS: 175.0000 ug | ORAL_TABLET | Freq: Every day | ORAL | 0 refills | Status: DC

## 2019-09-13 NOTE — Telephone Encounter (Signed)
Patient called in wanting a refill on her levothyroxine (SYNTHROID) 175 MCG tablet States she's been out over the weekend. Knows she missed her last appt, and has been scheduled her another appt   Please advise

## 2019-09-13 NOTE — Telephone Encounter (Signed)
levothyroxine (SYNTHROID) 175 MCG tablet 30 tablet 0 09/13/2019    Sig - Route: Take 1 tablet (175 mcg total) by mouth daily before breakfast. - Oral   Sent to pharmacy as: levothyroxine (SYNTHROID) 175 MCG tablet   Notes to Pharmacy: Will provide 30 day supply. Overdue for an appt. Future refill requests will require an appt   E-Prescribing Status: Receipt confirmed by pharmacy (09/13/2019 8:19 AM EDT)

## 2019-09-15 ENCOUNTER — Telehealth: Payer: Self-pay

## 2019-09-15 NOTE — Telephone Encounter (Signed)
Pt has not been seen since 10/28/18. Future refill requests will require an appt before they can be approved and sent

## 2019-09-15 NOTE — Telephone Encounter (Signed)
Patients insurance company called and stated that in order for patient to be approved for medication it needs to be a 90 day supply. Medication : levothyroxine (SYNTHROID) 175 MCG tablet

## 2019-09-22 ENCOUNTER — Other Ambulatory Visit: Payer: Self-pay

## 2019-09-24 ENCOUNTER — Ambulatory Visit: Payer: BC Managed Care – PPO | Admitting: Endocrinology

## 2019-10-07 ENCOUNTER — Other Ambulatory Visit: Payer: Self-pay | Admitting: Endocrinology

## 2019-10-07 DIAGNOSIS — E039 Hypothyroidism, unspecified: Secondary | ICD-10-CM

## 2019-10-08 NOTE — Telephone Encounter (Signed)
30 day supply provided in October. Rx was written: Will provide 30 day supply. Overdue for an appt. Future refill requests will require an appt. Please advise

## 2019-10-08 NOTE — Telephone Encounter (Signed)
1.  Please schedule f/u appt 2.  Then please refill x 1, pending that appt.  

## 2019-10-08 NOTE — Telephone Encounter (Signed)
Per Dr. Ellison, unable to refill Levothyroxine without an appt. Routing this message to the front desk for scheduling purposes.  

## 2019-10-15 MED ORDER — LEVOTHYROXINE SODIUM 175 MCG PO TABS: 175.0000 ug | ORAL_TABLET | Freq: Every day | ORAL | 0 refills | Status: DC

## 2019-10-15 NOTE — Telephone Encounter (Signed)
Patient is scheduled for appointment on 11/09/19 at 10:45 a.m.

## 2019-10-15 NOTE — Telephone Encounter (Signed)
30 day supply sent

## 2019-10-15 NOTE — Addendum Note (Signed)
Addended by: Cardell Peach I on: 10/15/2019 02:08 PM   Modules accepted: Orders

## 2019-11-09 ENCOUNTER — Ambulatory Visit: Payer: BC Managed Care – PPO | Admitting: Endocrinology

## 2019-11-20 ENCOUNTER — Other Ambulatory Visit: Payer: Self-pay | Admitting: Internal Medicine

## 2019-11-20 DIAGNOSIS — E039 Hypothyroidism, unspecified: Secondary | ICD-10-CM

## 2019-12-20 ENCOUNTER — Other Ambulatory Visit: Payer: Self-pay

## 2019-12-20 ENCOUNTER — Ambulatory Visit (INDEPENDENT_AMBULATORY_CARE_PROVIDER_SITE_OTHER): Payer: BC Managed Care – PPO | Admitting: Endocrinology

## 2019-12-20 ENCOUNTER — Encounter: Payer: Self-pay | Admitting: Endocrinology

## 2019-12-20 VITALS — BP 120/82 | HR 88 | Ht 63.0 in | Wt 231.8 lb

## 2019-12-20 DIAGNOSIS — E039 Hypothyroidism, unspecified: Secondary | ICD-10-CM

## 2019-12-20 DIAGNOSIS — R252 Cramp and spasm: Secondary | ICD-10-CM

## 2019-12-20 LAB — BASIC METABOLIC PANEL
BUN: 16 mg/dL (ref 6–23)
CO2: 27 mEq/L (ref 19–32)
Calcium: 9.6 mg/dL (ref 8.4–10.5)
Chloride: 104 mEq/L (ref 96–112)
Creatinine, Ser: 0.73 mg/dL (ref 0.40–1.20)
GFR: 93.33 mL/min (ref >60.00)
Glucose, Bld: 92 mg/dL (ref 70–99)
Potassium: 4.2 mEq/L (ref 3.5–5.1)
Sodium: 137 mEq/L (ref 135–145)

## 2019-12-20 LAB — TSH: TSH: 8.51 u[IU]/mL — ABNORMAL HIGH (ref 0.35–4.50)

## 2019-12-20 LAB — T4, FREE: Free T4: 0.64 ng/dL (ref 0.60–1.60)

## 2019-12-20 MED ORDER — LEVOTHYROXINE SODIUM 200 MCG PO TABS: 200.0000 ug | ORAL_TABLET | Freq: Every day | ORAL | 3 refills | Status: DC

## 2019-12-20 NOTE — Patient Instructions (Signed)
Blood tests are requested for you today.  We'll let you know about the results.  Please come back for a follow-up appointment in 1 year.    

## 2019-12-20 NOTE — Progress Notes (Signed)
Subjective:    Patient ID: Kristy Wright, female    DOB: [birth year], 29 y.o.   MRN: [id number]  HPI Pt returns to f/u of chronic primary hypothyroidism (dx'ed 2009; [place] in 2014 showed enlarged and somewhat hypervascular thyroid, but no nodule; f/u US in 2016 was unchanged; she has a "Mirena" IUD).  She takes synthroid as rx'ed.  Main symptom is muscle cramps.   Past Medical History:  Diagnosis Date  . HYPOTHYROIDISM 03/13/2009  . Pyelonephritis   . Thyroid activity decreased   . Thyroid activity decreased     No past surgical history on file.  Social History   Socioeconomic History  . Marital status: Single    Spouse name: Not on file  . Number of children: 3  . Years of education: Not on file  . Highest education level: Not on file  Occupational History  . Not on file  Tobacco Use  . Smoking status: Never Smoker  . Smokeless tobacco: Never Used  Substance and Sexual Activity  . Alcohol use: Yes    Comment: occ  . Drug use: No  . Sexual activity: Yes    Birth control/protection: None  Other Topics Concern  . Not on file  Social History Narrative   At age 84 began living with her older sister. States her mom was never really involved and she doesn't get along with her father. States they were "crazy parents". Doesn't know their history. She has 3 sons born in 2006, 2007, and 2009. Works at [employer]. Completed 10th grade but did not graduate from high school.   Social Determinants of Health   Financial Resource Strain:   . Difficulty of Paying Living Expenses: Not on file  Food Insecurity:   . Worried About Programme researcher, broadcasting/film/video in the Last Year: Not on file  . Ran Out of Food in the Last Year: Not on file  Transportation Needs:   . Lack of Transportation (Medical): Not on file  . Lack of Transportation (Non-Medical): Not on file  Physical Activity:   . Days of Exercise per Week: Not on file  . Minutes of Exercise per Session: Not on file  Stress:   . Feeling of  Stress : Not on file  Social Connections:   . Frequency of Communication with Friends and Family: Not on file  . Frequency of Social Gatherings with Friends and Family: Not on file  . Attends Religious Services: Not on file  . Active Member of Clubs or Organizations: Not on file  . Attends Banker Meetings: Not on file  . Marital Status: Not on file  Intimate Partner Violence:   . Fear of Current or Ex-Partner: Not on file  . Emotionally Abused: Not on file  . Physically Abused: Not on file  . Sexually Abused: Not on file    Current Outpatient Medications on File Prior to Visit  Medication Sig Dispense Refill  . ciprofloxacin-dexamethasone (CIPRODEX) OTIC suspension Place 4 drops into the right ear 2 (two) times daily. 7.5 mL 0  . levonorgestrel (MIRENA) 20 MCG/24HR IUD 1 each by Intrauterine route once.    . Multiple Vitamins-Minerals (MULTIVITAMIN PO) Take by mouth daily.    . Vitamin D, Ergocalciferol, (DRISDOL) 1.25 MG (50000 UT) CAPS capsule Take 1 capsule (50,000 Units total) by mouth every 7 (seven) days. 12 capsule 0   No current facility-administered medications on file prior to visit.    No Known Allergies  Family History  Problem Relation  Age of Onset  . Thyroid disease Sister        uncertain type  . Hypertension Sister   . Stroke Other        Grandmother-CVA  . Diabetes Maternal Grandmother   . Heart disease Maternal Grandmother   . Hypertension Maternal Grandmother   . Cancer Paternal Grandmother        breast  . Hypertension Paternal Grandmother   . Arthritis Paternal Grandmother   . Thyroid disease Paternal Grandmother   . Diabetes Paternal Grandfather   . Liver disease Father   . Hypertension Father   . Diabetes Father   . Thyroid disease Brother   . Healthy Son   . Healthy Son   . Healthy Son   . Healthy Son   . Healthy Daughter     BP 120/82 (BP Location: Left Arm, Patient Position: Sitting, Cuff Size: Large)   Pulse 88   Ht 5'  3" (1.6 m)   Wt 231 lb 12.8 oz (105.1 kg)   SpO2 99%   BMI 41.06 kg/m    Review of Systems She as fatigue.      Objective:   Physical Exam VITAL SIGNS:  See vs page GENERAL: no distress NECK: Thyroid is slightly and diffusely enlarged.  No thyroid nodule is palpable.  No palpable lymphadenopathy at the anterior neck.   Lab Results  Component Value Date   TSH 8.51 (H) 12/20/2019      Assessment & Plan:  Hypothyroidism: she needs increased rx.  I have sent a prescription to your pharmacy, to increase synthroid

## 2020-08-14 DIAGNOSIS — Z20828 Contact with and (suspected) exposure to other viral communicable diseases: Secondary | ICD-10-CM | POA: Diagnosis not present

## 2020-09-19 ENCOUNTER — Encounter (HOSPITAL_COMMUNITY): Payer: Self-pay

## 2020-09-19 ENCOUNTER — Other Ambulatory Visit: Payer: Self-pay

## 2020-09-19 ENCOUNTER — Emergency Department (HOSPITAL_COMMUNITY): Payer: BC Managed Care – PPO

## 2020-09-19 ENCOUNTER — Emergency Department (HOSPITAL_COMMUNITY)
Admission: EM | Admit: 2020-09-19 | Discharge: 2020-09-19 | Disposition: A | Discharge status: Home / Self Care | Payer: BC Managed Care – PPO | Attending: Emergency Medicine | Admitting: Emergency Medicine

## 2020-09-19 DIAGNOSIS — S0990XA Unspecified injury of head, initial encounter: Secondary | ICD-10-CM | POA: Insufficient documentation

## 2020-09-19 DIAGNOSIS — E039 Hypothyroidism, unspecified: Secondary | ICD-10-CM | POA: Insufficient documentation

## 2020-09-19 DIAGNOSIS — Z79899 Other long term (current) drug therapy: Secondary | ICD-10-CM | POA: Diagnosis not present

## 2020-09-19 NOTE — Discharge Instructions (Signed)
Your head CT today was reassuring, I want you to follow-up with your primary care in the next couple of days.  Please use the attached instructions.  If you have any new or worsening concerning symptoms such as vision changes, numbness, tingling, weakness, confusion, severe headache please come back to the emergency department.
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
## 2020-11-10 DIAGNOSIS — Z20828 Contact with and (suspected) exposure to other viral communicable diseases: Secondary | ICD-10-CM | POA: Diagnosis not present

## 2020-12-15 ENCOUNTER — Other Ambulatory Visit: Payer: Self-pay

## 2020-12-19 ENCOUNTER — Ambulatory Visit: Payer: BC Managed Care – PPO | Admitting: Endocrinology

## 2021-01-24 ENCOUNTER — Other Ambulatory Visit: Payer: Self-pay | Admitting: Endocrinology

## 2021-01-25 NOTE — Telephone Encounter (Signed)
MEDICATION: levothyroxine  PHARMACY:   [id number] [place], [place] - [address] Phone:  [phone]  Fax:  [phone]      HAS THE PATIENT CONTACTED THEIR PHARMACY?  yes  IS THIS A 90 DAY SUPPLY :  No, pt requests 30 day refill  IS PATIENT OUT OF MEDICATION: yes, out for 2 days  IF NOT; HOW MUCH IS LEFT:   LAST APPOINTMENT DATE: @1 /25/2022  NEXT APPOINTMENT DATE:@Visit  date not found  DO WE HAVE YOUR PERMISSION TO LEAVE A DETAILED MESSAGE?:  OTHER COMMENTS:    **Let patient know to contact pharmacy at the end of the day to make sure medication is ready. **  ** Please notify patient to allow 48-72 hours to process**  **Encourage patient to contact the pharmacy for refills or they can request refills through [facility]**

## 2021-01-26 NOTE — Telephone Encounter (Signed)
Patient called again to request the following RX:  MEDICATION: levothyroxine (SYNTHROID) 200 MCG tablet  PHARMACY:   [id number] [place], [place] - [address] Phone:  [phone]  Fax:  [phone]       HAS THE PATIENT CONTACTED THEIR PHARMACY?  Yes-Patient states PHARM has sent 2 previous requests  IS THIS A 90 DAY SUPPLY : Either 30 or 90 day  IS PATIENT OUT OF MEDICATION: Yes  IF NOT; HOW MUCH IS LEFT: 0  LAST APPOINTMENT DATE: @1 /25/2022  NEXT APPOINTMENT DATE:@4 /11/2020  DO WE HAVE YOUR PERMISSION TO LEAVE A DETAILED MESSAGE?: Yes  OTHER COMMENTS:    **Let patient know to contact pharmacy at the end of the day to make sure medication is ready. **  ** Please notify patient to allow 48-72 hours to process**  **Encourage patient to contact the pharmacy for refills or they can request refills through [facility]**

## 2021-02-23 ENCOUNTER — Ambulatory Visit (INDEPENDENT_AMBULATORY_CARE_PROVIDER_SITE_OTHER): Payer: BC Managed Care – PPO | Admitting: Endocrinology

## 2021-02-23 ENCOUNTER — Other Ambulatory Visit: Payer: Self-pay

## 2021-02-23 VITALS — BP 110/70 | HR 88 | Ht 63.0 in | Wt 239.0 lb

## 2021-02-23 DIAGNOSIS — E039 Hypothyroidism, unspecified: Secondary | ICD-10-CM | POA: Diagnosis not present

## 2021-02-23 LAB — T4, FREE: Free T4: 0.94 ng/dL (ref 0.60–1.60)

## 2021-02-23 LAB — TSH: TSH: 3.6 u[IU]/mL (ref 0.35–4.50)

## 2021-02-23 NOTE — Progress Notes (Signed)
Subjective:    Patient ID: Kristy Wright, female    DOB: [birth year], 30 y.o.   MRN: [id number]  HPI Pt returns to f/u of chronic primary hypothyroidism (dx'ed 2009; [place] in 2014 showed enlarged hypervascular thyroid, but no nodule; f/u US in 2019 was unchanged; she has a "Mirena" IUD).  She takes synthroid as rx'ed. pt states she feels well in general.   Past Medical History:  Diagnosis Date  . HYPOTHYROIDISM 03/13/2009  . Pyelonephritis   . Thyroid activity decreased   . Thyroid activity decreased     No past surgical history on file.  Social History   Socioeconomic History  . Marital status: Single    Spouse name: Not on file  . Number of children: 3  . Years of education: Not on file  . Highest education level: Not on file  Occupational History  . Not on file  Tobacco Use  . Smoking status: Never Smoker  . Smokeless tobacco: Never Used  Vaping Use  . Vaping Use: Never used  Substance and Sexual Activity  . Alcohol use: Yes    Comment: occ  . Drug use: No  . Sexual activity: Yes    Birth control/protection: None  Other Topics Concern  . Not on file  Social History Narrative   At age 41 began living with her older sister. States her mom was never really involved and she doesn't get along with her father. States they were "crazy parents". Doesn't know their history. She has 3 sons born in 2006, 2007, and 2009. Works at [employer]. Completed 10th grade but did not graduate from high school.   Social Determinants of Health   Financial Resource Strain: Not on file  Food Insecurity: Not on file  Transportation Needs: Not on file  Physical Activity: Not on file  Stress: Not on file  Social Connections: Not on file  Intimate Partner Violence: Not on file    Current Outpatient Medications on File Prior to Visit  Medication Sig Dispense Refill  . ciprofloxacin-dexamethasone (CIPRODEX) OTIC suspension Place 4 drops into the right ear 2 (two) times daily. 7.5 mL 0  .  levonorgestrel (MIRENA) 20 MCG/24HR IUD 1 each by Intrauterine route once.    Marland Kitchen levothyroxine (SYNTHROID) 200 MCG tablet TAKE 1 TABLET (200 MCG TOTAL) BY MOUTH DAILY BEFORE BREAKFAST. 90 tablet 0  . Multiple Vitamins-Minerals (MULTIVITAMIN PO) Take by mouth daily.    . Vitamin D, Ergocalciferol, (DRISDOL) 1.25 MG (50000 UT) CAPS capsule Take 1 capsule (50,000 Units total) by mouth every 7 (seven) days. 12 capsule 0   No current facility-administered medications on file prior to visit.    No Known Allergies  Family History  Problem Relation Age of Onset  . Thyroid disease Sister        uncertain type  . Hypertension Sister   . Stroke Other        Grandmother-CVA  . Diabetes Maternal Grandmother   . Heart disease Maternal Grandmother   . Hypertension Maternal Grandmother   . Cancer Paternal Grandmother        breast  . Hypertension Paternal Grandmother   . Arthritis Paternal Grandmother   . Thyroid disease Paternal Grandmother   . Diabetes Paternal Grandfather   . Liver disease Father   . Hypertension Father   . Diabetes Father   . Thyroid disease Brother   . Healthy Son   . Healthy Son   . Healthy Son   . Healthy Son   .  Healthy Daughter     BP 110/70 (BP Location: Right Arm, Patient Position: Sitting, Cuff Size: Large)   Pulse 88   Ht 5\' 3"  (1.6 m)   Wt 239 lb (108.4 kg)   SpO2 97%   BMI 42.34 kg/m    Review of Systems     Objective:   Physical Exam VITAL SIGNS:  See vs page GENERAL: no distress NECK: There is no palpable thyroid enlargement.  No thyroid nodule is palpable.  No palpable lymphadenopathy at the anterior neck.  Lab Results  Component Value Date   TSH 3.60 02/23/2021       Assessment & Plan:  Hypothyroidism: well-replaced.  Please continue the same synthroid

## 2021-02-23 NOTE — Patient Instructions (Addendum)
Blood tests are requested for you today.  We'll let you know about the results.  It is best to never miss the medication.  However, if you do miss it, next best is to double up the next time.  Please come back for a follow-up appointment in 1 year.   

## 2021-06-03 ENCOUNTER — Other Ambulatory Visit: Payer: Self-pay | Admitting: Endocrinology

## 2021-06-19 ENCOUNTER — Ambulatory Visit: Payer: Self-pay

## 2021-06-20 ENCOUNTER — Emergency Department
Admission: EM | Admit: 2021-06-20 | Discharge: 2021-06-20 | Disposition: A | Discharge status: Home / Self Care | Payer: BC Managed Care – PPO | Attending: Emergency Medicine | Admitting: Emergency Medicine

## 2021-06-20 ENCOUNTER — Other Ambulatory Visit: Payer: Self-pay

## 2021-06-20 ENCOUNTER — Encounter: Payer: Self-pay | Admitting: Intensive Care

## 2021-06-20 DIAGNOSIS — E039 Hypothyroidism, unspecified: Secondary | ICD-10-CM | POA: Insufficient documentation

## 2021-06-20 DIAGNOSIS — H9202 Otalgia, left ear: Secondary | ICD-10-CM | POA: Diagnosis not present

## 2021-06-20 DIAGNOSIS — M542 Cervicalgia: Secondary | ICD-10-CM | POA: Insufficient documentation

## 2021-06-20 DIAGNOSIS — R059 Cough, unspecified: Secondary | ICD-10-CM | POA: Insufficient documentation

## 2021-06-20 DIAGNOSIS — H66002 Acute suppurative otitis media without spontaneous rupture of ear drum, left ear: Secondary | ICD-10-CM | POA: Insufficient documentation

## 2021-06-20 DIAGNOSIS — Z79899 Other long term (current) drug therapy: Secondary | ICD-10-CM | POA: Insufficient documentation

## 2021-06-20 MED ORDER — KETOROLAC TROMETHAMINE 30 MG/ML IJ SOLN
30.0000 mg | Freq: Once | INTRAMUSCULAR | Status: AC
  Administered 2021-06-20: 30 mg via INTRAMUSCULAR
  Filled 2021-06-20: qty 1

## 2021-06-20 MED ORDER — KETOROLAC TROMETHAMINE 10 MG PO TABS: 10.0000 mg | ORAL_TABLET | Freq: Four times a day (QID) | ORAL | 0 refills | Status: AC | PRN

## 2021-06-20 MED ORDER — AMOXICILLIN 500 MG PO CAPS
500.0000 mg | ORAL_CAPSULE | Freq: Once | ORAL | Status: AC
  Administered 2021-06-20: 500 mg via ORAL
  Filled 2021-06-20: qty 1

## 2021-06-20 MED ORDER — AMOXICILLIN 875 MG PO TABS: 875.0000 mg | ORAL_TABLET | Freq: Two times a day (BID) | ORAL | 0 refills | Status: AC

## 2021-06-20 NOTE — ED Provider Notes (Signed)
[facility]-EMERGENCY DEPARTMENT  ____________________________________________  Time seen: Approximately 3:30 PM  I have reviewed the triage vital signs and the nursing notes.   HISTORY  Chief Complaint Cough, Headache, and Otalgia   Historian Patient     HPI Kristy Wright is a 30 y.o. female presents to the emergency department with left-sided ear pain.  Patient reports that for the past week, she has had nonproductive cough that has since improved and almost completely resolved.  She reports that she is having some left-sided posterior neck pain that seems to radiate to her ear.  No discharge from the left ear or fever.  No chest pain, chest tightness or abdominal pain.  No headache, blurry vision or dizziness.  No other alleviating measures have been attempted   Past Medical History:  Diagnosis Date   HYPOTHYROIDISM 03/13/2009   Pyelonephritis    Thyroid activity decreased    Thyroid activity decreased      Immunizations up to date:  Yes.     Past Medical History:  Diagnosis Date   HYPOTHYROIDISM 03/13/2009   Pyelonephritis    Thyroid activity decreased    Thyroid activity decreased     Patient Active Problem List   Diagnosis Date Noted   Cramp in limb 12/20/2019   Vitamin D deficiency 12/24/2018   Eye swelling, bilateral 10/28/2018   Weight gain 07/07/2018   Hypertriglyceridemia 06/12/2017   Back pain, chronic 08/26/2013   IUD (intrauterine device) in place 08/26/2013   Thyroid activity decreased    Nontoxic multinodular goiter 04/06/2011   Hypothyroidism 03/13/2009   UTI 03/13/2009    History reviewed. No pertinent surgical history.  Prior to Admission medications   Medication Sig Start Date End Date Taking? Authorizing Provider  amoxicillin (AMOXIL) 875 MG tablet Take 1 tablet (875 mg total) by mouth 2 (two) times daily for 10 days. 06/20/21 06/30/21 Yes Pia Mau M, PA-C  ketorolac (TORADOL) 10 MG tablet Take 1 tablet (10 mg total) by mouth every 6  (six) hours as needed for up to 5 days. 06/20/21 06/25/21 Yes Pia Mau M, PA-C  ciprofloxacin-dexamethasone (CIPRODEX) OTIC suspension Place 4 drops into the right ear 2 (two) times daily. 08/13/19   Janace Aris, NP  levonorgestrel (MIRENA) 20 MCG/24HR IUD 1 each by Intrauterine route once.    [provider]  levothyroxine (SYNTHROID) 200 MCG tablet TAKE 1 TABLET (200 MCG TOTAL) BY MOUTH DAILY BEFORE BREAKFAST. 06/04/21   Romero Belling, MD  Multiple Vitamins-Minerals (MULTIVITAMIN PO) Take by mouth daily.    [provider]  Vitamin D, Ergocalciferol, (DRISDOL) 1.25 MG (50000 UT) CAPS capsule Take 1 capsule (50,000 Units total) by mouth every 7 (seven) days. 12/18/18   Pollyann Savoy, MD    Allergies Patient has no known allergies.  Family History  Problem Relation Age of Onset   Thyroid disease Sister        uncertain type   Hypertension Sister    Stroke Other        Grandmother-CVA   Diabetes Maternal Grandmother    Heart disease Maternal Grandmother    Hypertension Maternal Grandmother    Cancer Paternal Grandmother        breast   Hypertension Paternal Grandmother    Arthritis Paternal Grandmother    Thyroid disease Paternal Grandmother    Diabetes Paternal Grandfather    Liver disease Father    Hypertension Father    Diabetes Father    Thyroid disease Brother    Healthy Son  Healthy Son    Healthy Son    Healthy Son    Healthy Daughter     Social History Social History   Tobacco Use   Smoking status: Never   Smokeless tobacco: Never  Vaping Use   Vaping Use: Never used  Substance Use Topics   Alcohol use: Yes    Alcohol/week: 6.0 standard drinks    Types: 6 Cans of beer per week   Drug use: No     Review of Systems  Constitutional: No fever/chills Eyes:  No discharge ENT: Patient has left ear pain.  Respiratory: no cough. No SOB/ use of accessory muscles to breath Gastrointestinal:   No nausea, no vomiting.  No diarrhea.  No  constipation. Musculoskeletal: Negative for musculoskeletal pain. Skin: Negative for rash, abrasions, lacerations, ecchymosis.    ____________________________________________   PHYSICAL EXAM:  VITAL SIGNS: ED Triage Vitals  Enc Vitals Group     BP 06/20/21 1306 (!) 123/95     Pulse Rate 06/20/21 1306 76     Resp 06/20/21 1306 18     Temp 06/20/21 1306 98.1 F (36.7 C)     Temp Source 06/20/21 1306 Oral     SpO2 06/20/21 1306 98 %     Weight 06/20/21 1307 230 lb (104.3 kg)     Height 06/20/21 1307 5\' 3"  (1.6 m)     Head Circumference --      Peak Flow --      Pain Score 06/20/21 1307 8     Pain Loc --      Pain Edu? --      Excl. in GC? --      Constitutional: Alert and oriented. Well appearing and in no acute distress. Eyes: Conjunctivae are normal. PERRL. EOMI. Head: Atraumatic. ENT:      Ears: Left TM is bulging with evidence of purulence behind TM.  No pain with palpation of the tragus.  No tenderness to palpation over the left mastoid process.  No overlying erythema over mastoid process.      Nose: No congestion/rhinnorhea.        Mouth/Throat: Mucous membranes are moist.  Neck: No stridor.  Patient demonstrates full range of motion at the neck.  No midline C-spine tenderness to palpation. Cardiovascular: Normal rate, regular rhythm. Normal S1 and S2.  Good peripheral circulation. Respiratory: Normal respiratory effort without tachypnea or retractions. Lungs CTAB. Good air entry to the bases with no decreased or absent breath sounds Gastrointestinal: Bowel sounds x 4 quadrants. Soft and nontender to palpation. No guarding or rigidity. No distention. Musculoskeletal: Full range of motion to all extremities. No obvious deformities noted Neurologic:  Normal for age. No gross focal neurologic deficits are appreciated.  Skin:  Skin is warm, dry and intact. No rash noted. Psychiatric: Mood and affect are normal for age. Speech and behavior are normal.    ____________________________________________   LABS (all labs ordered are listed, but only abnormal results are displayed)  Labs Reviewed - No data to display ____________________________________________  EKG   ____________________________________________  RADIOLOGY   No results found.  ____________________________________________    PROCEDURES  Procedure(s) performed:     Procedures     Medications  ketorolac (TORADOL) 30 MG/ML injection 30 mg (30 mg Intramuscular Given 06/20/21 1533)  amoxicillin (AMOXIL) capsule 500 mg (500 mg Oral Given 06/20/21 1533)     ____________________________________________   INITIAL IMPRESSION / ASSESSMENT AND PLAN / ED COURSE  Pertinent labs & imaging results that were available  during my care of the patient were reviewed by me and considered in my medical decision making (see chart for details).      Assessment and plan Left ear pain 30 year old female presents to the emergency department with left-sided posterior neck pain that seems to radiate to the left ear.  On exam, left TM was bulging and there was evidence of purulence behind left TM concerning for otitis media.  Patient had full range of motion of the neck with no midline C-spine tenderness.  No other concerning symptoms such as headache, blurry vision or nuchal rigidity.  We will treat with Toradol in the emergency department and discharged with amoxicillin.  Return precautions were given to return with new or worsening symptoms.     ____________________________________________  FINAL CLINICAL IMPRESSION(S) / ED DIAGNOSES  Final diagnoses:  Acute suppurative otitis media of left ear without spontaneous rupture of tympanic membrane, recurrence not specified      NEW MEDICATIONS STARTED DURING THIS VISIT:  ED Discharge Orders          Ordered    ketorolac (TORADOL) 10 MG tablet  Every 6 hours PRN        06/20/21 1548    amoxicillin (AMOXIL) 875 MG  tablet  2 times daily        06/20/21 1549                This chart was dictated using voice recognition software/Dragon. Despite best efforts to proofread, errors can occur which can change the meaning. Any change was purely unintentional.     Orvil Feil, PA-C 06/20/21 1554    Merwyn Katos, MD 06/20/21 [phone]

## 2021-06-20 NOTE — Discharge Instructions (Addendum)
Take Amoxicillin twice daily for ten days.  

## 2021-06-20 NOTE — ED Triage Notes (Signed)
Pt c/o left ear pain, head pain, and cough X1 week.

## 2021-08-30 ENCOUNTER — Other Ambulatory Visit: Payer: Self-pay | Admitting: Endocrinology

## 2021-10-02 DIAGNOSIS — R0609 Other forms of dyspnea: Secondary | ICD-10-CM | POA: Diagnosis not present

## 2021-10-02 DIAGNOSIS — E039 Hypothyroidism, unspecified: Secondary | ICD-10-CM | POA: Diagnosis not present

## 2021-10-02 DIAGNOSIS — R600 Localized edema: Secondary | ICD-10-CM | POA: Diagnosis not present

## 2021-10-02 DIAGNOSIS — R760 Raised antibody titer: Secondary | ICD-10-CM | POA: Diagnosis not present

## 2021-10-02 DIAGNOSIS — Z79899 Other long term (current) drug therapy: Secondary | ICD-10-CM | POA: Diagnosis not present

## 2021-10-03 DIAGNOSIS — Z30432 Encounter for removal of intrauterine contraceptive device: Secondary | ICD-10-CM | POA: Diagnosis not present

## 2021-10-03 DIAGNOSIS — Z6841 Body Mass Index (BMI) 40.0 and over, adult: Secondary | ICD-10-CM | POA: Diagnosis not present

## 2021-11-01 ENCOUNTER — Encounter: Payer: BC Managed Care – PPO | Admitting: Obstetrics and Gynecology

## 2021-12-27 DIAGNOSIS — Z3202 Encounter for pregnancy test, result negative: Secondary | ICD-10-CM | POA: Diagnosis not present

## 2021-12-27 DIAGNOSIS — Z30011 Encounter for initial prescription of contraceptive pills: Secondary | ICD-10-CM | POA: Diagnosis not present

## 2022-02-05 ENCOUNTER — Other Ambulatory Visit: Payer: Self-pay

## 2022-02-05 ENCOUNTER — Emergency Department
Admission: EM | Admit: 2022-02-05 | Discharge: 2022-02-05 | Disposition: A | Discharge status: Home / Self Care | Payer: BC Managed Care – PPO | Attending: Emergency Medicine | Admitting: Emergency Medicine

## 2022-02-05 ENCOUNTER — Encounter: Payer: Self-pay | Admitting: Emergency Medicine

## 2022-02-05 DIAGNOSIS — Z20822 Contact with and (suspected) exposure to covid-19: Secondary | ICD-10-CM | POA: Diagnosis not present

## 2022-02-05 DIAGNOSIS — R112 Nausea with vomiting, unspecified: Secondary | ICD-10-CM | POA: Insufficient documentation

## 2022-02-05 DIAGNOSIS — R1013 Epigastric pain: Secondary | ICD-10-CM | POA: Diagnosis not present

## 2022-02-05 DIAGNOSIS — R9431 Abnormal electrocardiogram [ECG] [EKG]: Secondary | ICD-10-CM | POA: Diagnosis not present

## 2022-02-05 LAB — COMPREHENSIVE METABOLIC PANEL
ALT: 26 U/L (ref 0–44)
AST: 25 U/L (ref 15–41)
Albumin: 3.6 g/dL (ref 3.5–5.0)
Alkaline Phosphatase: 55 U/L (ref 38–126)
Anion gap: 6 (ref 5–15)
BUN: 13 mg/dL (ref 6–20)
CO2: 27 mmol/L (ref 22–32)
Calcium: 8.6 mg/dL — ABNORMAL LOW (ref 8.9–10.3)
Chloride: 106 mmol/L (ref 98–111)
Creatinine, Ser: 0.89 mg/dL (ref 0.44–1.00)
GFR, Estimated: >60 mL/min (ref >60)
Glucose, Bld: 100 mg/dL — ABNORMAL HIGH (ref 70–99)
Potassium: 3.6 mmol/L (ref 3.5–5.1)
Sodium: 139 mmol/L (ref 135–145)
Total Bilirubin: 0.6 mg/dL (ref 0.3–1.2)
Total Protein: 7.2 g/dL (ref 6.5–8.1)

## 2022-02-05 LAB — CBC WITH DIFFERENTIAL/PLATELET
Abs Immature Granulocytes: 0.02 10*3/uL (ref 0.00–0.07)
Basophils Absolute: 0 10*3/uL (ref 0.0–0.1)
Basophils Relative: 1 %
Eosinophils Absolute: 0.1 10*3/uL (ref 0.0–0.5)
Eosinophils Relative: 2 %
HCT: 37.8 % (ref 36.0–46.0)
Hemoglobin: 12.4 g/dL (ref 12.0–15.0)
Immature Granulocytes: 0 %
Lymphocytes Relative: 41 %
Lymphs Abs: 2.7 10*3/uL (ref 0.7–4.0)
MCH: 29.4 pg (ref 26.0–34.0)
MCHC: 32.8 g/dL (ref 30.0–36.0)
MCV: 89.6 fL (ref 80.0–100.0)
Monocytes Absolute: 0.5 10*3/uL (ref 0.1–1.0)
Monocytes Relative: 8 %
Neutro Abs: 3.2 10*3/uL (ref 1.7–7.7)
Neutrophils Relative %: 48 %
Platelets: 257 10*3/uL (ref 150–400)
RBC: 4.22 MIL/uL (ref 3.87–5.11)
RDW: 12.8 % (ref 11.5–15.5)
WBC: 6.5 10*3/uL (ref 4.0–10.5)
nRBC: 0 % (ref 0.0–0.2)

## 2022-02-05 LAB — RESP PANEL BY RT-PCR (FLU A&B, COVID) ARPGX2
Influenza A by PCR: NEGATIVE
Influenza B by PCR: NEGATIVE
SARS Coronavirus 2 by RT PCR: NEGATIVE

## 2022-02-05 LAB — LIPASE, BLOOD: Lipase: 34 U/L (ref 11–51)

## 2022-02-05 MED ORDER — ONDANSETRON 8 MG PO TBDP: 8.0000 mg | ORAL_TABLET | Freq: Three times a day (TID) | ORAL | 0 refills | Status: DC | PRN

## 2022-02-05 MED ORDER — SODIUM CHLORIDE 0.9 % IV BOLUS
1000.0000 mL | Freq: Once | INTRAVENOUS | Status: AC
  Administered 2022-02-05: 1000 mL via INTRAVENOUS

## 2022-02-05 MED ORDER — ONDANSETRON HCL 4 MG/2ML IJ SOLN
4.0000 mg | Freq: Once | INTRAMUSCULAR | Status: AC
  Administered 2022-02-05: 4 mg via INTRAVENOUS
  Filled 2022-02-05: qty 2

## 2022-02-05 NOTE — ED Provider Notes (Signed)
? ?[facility] ?Provider Note ? ? Event Date/Time  ? First MD Initiated Contact with Patient 02/05/22 0754   ?  (approximate) ?History  ?Emesis ? ?HPI ?Kristy Wright is a 31 y.o. female who presents for emesis for the last 3 days.  Patient states that she was recently on a trip to [facility] when she began having nausea/vomiting 1 day after return.  Patient endorses mild midepigastric abdominal pain that does not radiate and is worsened with retching during vomiting.  Patient states that she has been p.o. intolerant over the last 3 days.  Patient denies any known sick contacts.  Patient denies any associated diarrhea.  Patient denies any fever, bloody emesis, chest pain, shortness of breath. ?Physical Exam  ?Triage Vital Signs: ?ED Triage Vitals  ?Enc Vitals Group  ?   BP 02/05/22 0747 137/86  ?   Pulse Rate 02/05/22 0747 75  ?   Resp 02/05/22 0747 20  ?   Temp 02/05/22 0747 97.7 ?F (36.5 ?C)  ?   Temp Source 02/05/22 0747 Oral  ?   SpO2 02/05/22 0747 99 %  ?   Weight 02/05/22 0748 230 lb (104.3 kg)  ?   Height 02/05/22 0748 5\' 3"  (1.6 m)  ?   Head Circumference --   ?   Peak Flow --   ?   Pain Score 02/05/22 0748 0  ?   Pain Loc --   ?   Pain Edu? --   ?   Excl. in GC? --   ? ?Most recent vital signs: ?Vitals:  ? 02/05/22 0747  ?BP: 137/86  ?Pulse: 75  ?Resp: 20  ?Temp: 97.7 ?F (36.5 ?C)  ?SpO2: 99%  ? ?General: Awake, oriented x4. ?CV:  Good peripheral perfusion.  ?Resp:  Normal effort.  ?Abd:  No distention.  ?Other:  Young adult obese Hispanic female laying in bed in no distress ?ED Results / Procedures / Treatments  ?Labs ?(all labs ordered are listed, but only abnormal results are displayed) ?Labs Reviewed  ?COMPREHENSIVE METABOLIC PANEL - Abnormal; Notable for the following components:  ?    Result Value  ? Glucose, Bld 100 (*)   ? Calcium 8.6 (*)   ? All other components within normal limits  ?RESP PANEL BY RT-PCR (FLU A&B, COVID) ARPGX2  ?LIPASE, BLOOD  ?CBC WITH DIFFERENTIAL/PLATELET   ? ?PROCEDURES: ?Critical Care performed: No ?.1-3 Lead EKG Interpretation ?Performed by: 02/07/22, MD ?Authorized by: Merwyn Katos, MD  ? ?  Interpretation: normal   ?  ECG rate:  74 ?  ECG rate assessment: normal   ?  Rhythm: sinus rhythm   ?  Ectopy: none   ?  Conduction: normal   ?MEDICATIONS ORDERED IN ED: ?Medications  ?sodium chloride 0.9 % bolus 1,000 mL (1,000 mLs Intravenous New Bag/Given 02/05/22 0809)  ?ondansetron [facility]) injection 4 mg (4 mg Intravenous Given 02/05/22 0810)  ? ?IMPRESSION / MDM / ASSESSMENT AND PLAN / ED COURSE  ?I reviewed the triage vital signs and the nursing notes. ?             ?               ?The patient is on the cardiac monitor to evaluate for evidence of arrhythmia and/or significant heart rate changes. ?Patient presents for acute nausea/vomiting ?The cause of the patient?s symptoms is not clear, but the patient is overall well appearing and is suspected to have a transient  course of illness. ? ?Given History and Exam there does not appear to be an emergent cause of the symptoms such as small bowel obstruction, coronary syndrome, bowel ischemia, DKA, pancreatitis, appendicitis, other acute abdomen or other emergent problem. ? ?Reassessment: After treatment, the patient is feeling much better, tolerating PO fluids, and shows no signs of dehydration.  ? ?Disposition: Discharge home with prompt primary care physician follow up in the next 48 hours. Strict return precautions discussed. ? ?  ?FINAL CLINICAL IMPRESSION(S) / ED DIAGNOSES  ? ?Final diagnoses:  ?Nausea and vomiting, unspecified vomiting type  ? ?Rx / DC Orders  ? ?ED Discharge Orders   ? ?      Ordered  ?  ondansetron (ZOFRAN-ODT) 8 MG disintegrating tablet  Every 8 hours PRN       ? 02/05/22 0904  ? ?  ?  ? ?  ? ?Note:  This document was prepared using Dragon voice recognition software and may include unintentional dictation errors. ?  ?Merwyn Katos, MD ?02/05/22 [phone] ? ?

## 2022-02-05 NOTE — ED Triage Notes (Signed)
Pt to ED via POV with c/o not beng able to keep anything down for the last 3 days. She came back from [place] and states that after eating at one last restaurant she has been sick ?

## 2022-02-11 ENCOUNTER — Encounter: Payer: Self-pay | Admitting: Emergency Medicine

## 2022-02-11 ENCOUNTER — Other Ambulatory Visit: Payer: Self-pay

## 2022-02-11 ENCOUNTER — Emergency Department
Admission: EM | Admit: 2022-02-11 | Discharge: 2022-02-11 | Disposition: A | Discharge status: Home / Self Care | Payer: BC Managed Care – PPO | Attending: Emergency Medicine | Admitting: Emergency Medicine

## 2022-02-11 DIAGNOSIS — R42 Dizziness and giddiness: Secondary | ICD-10-CM | POA: Diagnosis not present

## 2022-02-11 DIAGNOSIS — E860 Dehydration: Secondary | ICD-10-CM | POA: Diagnosis not present

## 2022-02-11 DIAGNOSIS — R110 Nausea: Secondary | ICD-10-CM | POA: Diagnosis not present

## 2022-02-11 DIAGNOSIS — R55 Syncope and collapse: Secondary | ICD-10-CM | POA: Diagnosis not present

## 2022-02-11 LAB — URINALYSIS, ROUTINE W REFLEX MICROSCOPIC
Bilirubin Urine: NEGATIVE
Glucose, UA: NEGATIVE mg/dL
Hgb urine dipstick: NEGATIVE
Ketones, ur: NEGATIVE mg/dL
Nitrite: NEGATIVE
Protein, ur: NEGATIVE mg/dL
Specific Gravity, Urine: 1.02 (ref 1.005–1.030)
pH: 5 (ref 5.0–8.0)

## 2022-02-11 LAB — CBC
HCT: 39.8 % (ref 36.0–46.0)
Hemoglobin: 12.9 g/dL (ref 12.0–15.0)
MCH: 29 pg (ref 26.0–34.0)
MCHC: 32.4 g/dL (ref 30.0–36.0)
MCV: 89.4 fL (ref 80.0–100.0)
Platelets: 261 10*3/uL (ref 150–400)
RBC: 4.45 MIL/uL (ref 3.87–5.11)
RDW: 12.9 % (ref 11.5–15.5)
WBC: 6 10*3/uL (ref 4.0–10.5)
nRBC: 0 % (ref 0.0–0.2)

## 2022-02-11 LAB — BASIC METABOLIC PANEL
Anion gap: 8 (ref 5–15)
BUN: 16 mg/dL (ref 6–20)
CO2: 24 mmol/L (ref 22–32)
Calcium: 8.8 mg/dL — ABNORMAL LOW (ref 8.9–10.3)
Chloride: 106 mmol/L (ref 98–111)
Creatinine, Ser: 0.81 mg/dL (ref 0.44–1.00)
GFR, Estimated: >60 mL/min (ref >60)
Glucose, Bld: 93 mg/dL (ref 70–99)
Potassium: 3.9 mmol/L (ref 3.5–5.1)
Sodium: 138 mmol/L (ref 135–145)

## 2022-02-11 LAB — POC URINE PREG, ED: Preg Test, Ur: NEGATIVE

## 2022-02-11 MED ORDER — SODIUM CHLORIDE 0.9 % IV SOLN
12.5000 mg | Freq: Four times a day (QID) | INTRAVENOUS | Status: DC | PRN
  Administered 2022-02-11: 12.5 mg via INTRAVENOUS
  Filled 2022-02-11: qty 0.5
  Filled 2022-02-11: qty 12.5

## 2022-02-11 MED ORDER — PROMETHAZINE HCL 12.5 MG PO TABS: 12.5000 mg | ORAL_TABLET | Freq: Four times a day (QID) | ORAL | 0 refills | Status: DC | PRN

## 2022-02-11 MED ORDER — SODIUM CHLORIDE 0.9 % IV SOLN
1000.0000 mL | Freq: Once | INTRAVENOUS | Status: AC
  Administered 2022-02-11: 1000 mL via INTRAVENOUS

## 2022-02-11 MED ORDER — PROMETHAZINE HCL 12.5 MG PO TABS
12.5000 mg | ORAL_TABLET | Freq: Four times a day (QID) | ORAL | 0 refills | Status: DC | PRN
Start: 2022-02-11 — End: 2022-02-11

## 2022-02-11 MED ORDER — SODIUM CHLORIDE 0.9 % IV SOLN: Freq: Once | INTRAVENOUS | Status: AC

## 2022-02-11 NOTE — ED Triage Notes (Addendum)
Pt via EMS from work. Pt c/o near syncope and dizziness, states it has been more severe today. ?Pt has had norovirus for about a week ago. Pt is A&OX4 and NAD ?124/86 ?76 hR  ?20 RR ?98% on RA ?126 CBG  ? ?20G L forearm ?

## 2022-02-11 NOTE — ED Notes (Signed)
Pt assisted to restroom to urinate as requested using wheelchair. Visitor remains at bedside.  ?

## 2022-02-11 NOTE — ED Notes (Addendum)
Pt reports currently dizzy but denies nausea. States has been taking zofran. States only gets naueous when she eats something. Symptoms started Monday and pt was seen here Tuesday per pt.  ?

## 2022-02-11 NOTE — ED Notes (Signed)
Pt states she is feeling quite dizzy again.  ?

## 2022-02-11 NOTE — ED Notes (Signed)
Pt denies dizziness but c/o nausea and weakness. Verbal okay from EDP Kinner to give pt something to drink. Given.  ?

## 2022-02-11 NOTE — ED Provider Notes (Signed)
? ?[facility] ?Provider Note ? ? ? Event Date/Time  ? First MD Initiated Contact with Patient 02/11/22 0957   ?  (approximate) ? ? ?History  ? ?Dizziness ? ? ?HPI ? ?DELIAH STREHLOW is a 31 y.o. female with no significant past medical history presents with complaints of feeling lightheaded and weak.  Patient reports she had norovirus last week, was treated for this still having decreased p.o. intake and still some residual nausea.  She feels lightheaded when she sits up or stands abruptly.  No chest pain, no abdominal pain.  No fevers or chills. ?  ? ? ?Physical Exam  ? ?Triage Vital Signs: ?ED Triage Vitals  ?Enc Vitals Group  ?   BP 02/11/22 0947 118/81  ?   Pulse Rate 02/11/22 0947 70  ?   Resp 02/11/22 0947 20  ?   Temp 02/11/22 0947 (!) 97.4 ?F (36.3 ?C)  ?   Temp Source 02/11/22 0947 Oral  ?   SpO2 02/11/22 0947 98 %  ?   Weight 02/11/22 0948 104.3 kg (230 lb)  ?   Height 02/11/22 0948 1.6 m (5\' 3" )  ?   Head Circumference --   ?   Peak Flow --   ?   Pain Score 02/11/22 0947 0  ?   Pain Loc --   ?   Pain Edu? --   ?   Excl. in GC? --   ? ? ?Most recent vital signs: ?Vitals:  ? 02/11/22 1400 02/11/22 1448  ?BP: 128/81 126/83  ?Pulse:  68  ?Resp:  18  ?Temp:    ?SpO2:  98%  ? ? ? ?General: Awake, no distress.  ?CV:  Good peripheral perfusion.  Normal heart rate ?Resp:  Normal effort.  CTA bilaterally, ?Abd:  No distention.  No CVA tenderness, no tenderness palpation of the abdomen ?Other:   ? ? ?ED Results / Procedures / Treatments  ? ?Labs ?(all labs ordered are listed, but only abnormal results are displayed) ?Labs Reviewed  ?BASIC METABOLIC PANEL - Abnormal; Notable for the following components:  ?    Result Value  ? Calcium 8.8 (*)   ? All other components within normal limits  ?URINALYSIS, ROUTINE W REFLEX MICROSCOPIC - Abnormal; Notable for the following components:  ? Color, Urine YELLOW (*)   ? APPearance CLOUDY (*)   ? Leukocytes,Ua LARGE (*)   ? Bacteria, UA RARE (*)   ? All  other components within normal limits  ?CBC  ?CBG MONITORING, ED  ?POC URINE PREG, ED  ? ? ? ?EKG ? ?ED ECG REPORT ?I, 02/13/22, the attending physician, personally viewed and interpreted this ECG. ? ?Date: 02/11/2022 ? ?Rhythm: normal sinus rhythm ?QRS Axis: normal ?Intervals: normal ?ST/T Wave abnormalities: normal ?Narrative Interpretation: no evidence of acute ischemia ? ? ? ?RADIOLOGY ? ? ? ? ?PROCEDURES: ? ?Critical Care performed:  ? ?Procedures ? ? ?MEDICATIONS ORDERED IN ED: ?Medications  ?promethazine (PHENERGAN) 12.5 mg in sodium chloride 0.9 % 50 mL IVPB (0 mg Intravenous Stopped 02/11/22 1410)  ?0.9 %  sodium chloride infusion (0 mLs Intravenous Stopped 02/11/22 1324)  ?0.9 %  sodium chloride infusion (0 mLs Intravenous Stopped 02/11/22 1414)  ? ? ? ?IMPRESSION / MDM / ASSESSMENT AND PLAN / ED COURSE  ?I reviewed the triage vital signs and the nursing notes. ? ? ?Patient presents with lightheadedness with sitting abruptly or standing rapidly.  She does report decreased p.o. intake.  Suspect dehydration  as a primary cause of her symptoms.  As well as residual viral illness. ? ?We will treat with IV fluids, IV Phenergan, she has been taking p.o. Zofran with little improvement ? ?Lab work is reassuring, CBC is normal.  BUN/creatinine ratio is reassuring. ? ?After fluids patient is feeling improved, did receive IV Phenergan with improvement in nausea.  She would like to go home and try to eat.  Will Rx Phenergan, return precautions discussed ? ? ?  ? ? ?FINAL CLINICAL IMPRESSION(S) / ED DIAGNOSES  ? ?Final diagnoses:  ?Dizziness  ?Dehydration  ? ? ? ?Rx / DC Orders  ? ?ED Discharge Orders   ? ?      Ordered  ?  promethazine (PHENERGAN) 12.5 MG tablet  Every 6 hours PRN,   Status:  Discontinued       ? 02/11/22 1433  ?  promethazine (PHENERGAN) 12.5 MG tablet  Every 6 hours PRN       ? 02/11/22 1442  ? ?  ?  ? ?  ? ? ? ?Note:  This document was prepared using Dragon voice recognition software and may  include unintentional dictation errors. ?  ?Jene Every, MD ?02/11/22 1511 ? ?

## 2022-02-11 NOTE — ED Notes (Signed)
Pt given another warm blanket as requested. 

## 2022-02-11 NOTE — ED Notes (Signed)
Pt assisted back into bed. Will give phenergan once received by pharm. Pt updated on plan.  ?

## 2022-02-27 ENCOUNTER — Ambulatory Visit: Payer: BC Managed Care – PPO | Admitting: Endocrinology

## 2022-03-04 ENCOUNTER — Other Ambulatory Visit: Payer: Self-pay | Admitting: Endocrinology

## 2022-03-04 NOTE — Telephone Encounter (Signed)
Mychart message sent to patient to schedule appt before refill can be sent  ?

## 2022-03-13 ENCOUNTER — Ambulatory Visit: Payer: BC Managed Care – PPO | Admitting: Endocrinology

## 2022-03-13 ENCOUNTER — Ambulatory Visit: Payer: BC Managed Care – PPO | Admitting: Physician Assistant

## 2022-03-13 NOTE — Progress Notes (Deleted)
New Patient Office Visit  Subjective    Patient ID: Kristy Wright, female    DOB: [birth year]  Age: 31 y.o. MRN: [id number]  CC: No chief complaint on file.   HPI Kristy Wright presents to establish care ***  Outpatient Encounter Medications as of 03/13/2022  Medication Sig   ciprofloxacin-dexamethasone (CIPRODEX) OTIC suspension Place 4 drops into the right ear 2 (two) times daily.   levonorgestrel (MIRENA) 20 MCG/24HR IUD 1 each by Intrauterine route once.   levothyroxine (SYNTHROID) 200 MCG tablet TAKE 1 TABLET (200 MCG TOTAL) BY MOUTH DAILY BEFORE BREAKFAST.   Multiple Vitamins-Minerals (MULTIVITAMIN PO) Take by mouth daily.   ondansetron (ZOFRAN-ODT) 8 MG disintegrating tablet Take 1 tablet (8 mg total) by mouth every 8 (eight) hours as needed for nausea or vomiting.   promethazine (PHENERGAN) 12.5 MG tablet Take 1 tablet (12.5 mg total) by mouth every 6 (six) hours as needed for nausea or vomiting.   Vitamin D, Ergocalciferol, (DRISDOL) 1.25 MG (50000 UT) CAPS capsule Take 1 capsule (50,000 Units total) by mouth every 7 (seven) days.   No facility-administered encounter medications on file as of 03/13/2022.    Past Medical History:  Diagnosis Date   HYPOTHYROIDISM 03/13/2009   Pyelonephritis    Thyroid activity decreased    Thyroid activity decreased     No past surgical history on file.  Family History  Problem Relation Age of Onset   Thyroid disease Sister        uncertain type   Hypertension Sister    Stroke Other        Grandmother-CVA   Diabetes Maternal Grandmother    Heart disease Maternal Grandmother    Hypertension Maternal Grandmother    Cancer Paternal Grandmother        breast   Hypertension Paternal Grandmother    Arthritis Paternal Grandmother    Thyroid disease Paternal Grandmother    Diabetes Paternal Grandfather    Liver disease Father    Hypertension Father    Diabetes Father    Thyroid disease Brother    Healthy Son    Healthy Son     Healthy Son    Healthy Son    Healthy Daughter     Social History   Socioeconomic History   Marital status: Single    Spouse name: Not on file   Number of children: 3   Years of education: Not on file   Highest education level: Not on file  Occupational History   Not on file  Tobacco Use   Smoking status: Never   Smokeless tobacco: Never  Vaping Use   Vaping Use: Never used  Substance and Sexual Activity   Alcohol use: Yes    Alcohol/week: 6.0 standard drinks    Types: 6 Cans of beer per week   Drug use: No   Sexual activity: Yes    Birth control/protection: I.U.D.  Other Topics Concern   Not on file  Social History Narrative   At age 27 began living with her older sister. States her mom was never really involved and she doesn't get along with her father. States they were "crazy parents". Doesn't know their history. She has 3 sons born in 2006, 2007, and 2009. Works at [employer]. Completed 10th grade but did not graduate from high school.   Social Determinants of Health   Financial Resource Strain: Not on file  Food Insecurity: Not on file  Transportation Needs: Not on file  Physical Activity: Not  on file  Stress: Not on file  Social Connections: Not on file  Intimate Partner Violence: Not on file    ROS      Objective    There were no vitals taken for this visit.  Physical Exam  {Labs (Optional):23779}    Assessment & Plan:   Problem List Items Addressed This Visit   None   No follow-ups on file.   Lupita Leash, CMA

## 2022-03-21 DIAGNOSIS — M79641 Pain in right hand: Secondary | ICD-10-CM | POA: Diagnosis not present

## 2022-04-01 ENCOUNTER — Ambulatory Visit: Payer: BC Managed Care – PPO | Admitting: Nurse Practitioner

## 2022-04-01 ENCOUNTER — Encounter: Payer: Self-pay | Admitting: Nurse Practitioner

## 2022-04-01 VITALS — BP 128/78 | HR 80 | Temp 97.5°F | Resp 12 | Ht 63.0 in | Wt 237.4 lb

## 2022-04-01 DIAGNOSIS — R1031 Right lower quadrant pain: Secondary | ICD-10-CM | POA: Insufficient documentation

## 2022-04-01 DIAGNOSIS — Z6841 Body Mass Index (BMI) 40.0 and over, adult: Secondary | ICD-10-CM

## 2022-04-01 DIAGNOSIS — Z3041 Encounter for surveillance of contraceptive pills: Secondary | ICD-10-CM | POA: Diagnosis not present

## 2022-04-01 DIAGNOSIS — Z7689 Persons encountering health services in other specified circumstances: Secondary | ICD-10-CM | POA: Diagnosis not present

## 2022-04-01 DIAGNOSIS — E039 Hypothyroidism, unspecified: Secondary | ICD-10-CM

## 2022-04-01 LAB — POCT URINE PREGNANCY: Preg Test, Ur: NEGATIVE

## 2022-04-01 MED ORDER — HAILEY 24 FE 1-20 MG-MCG(24) PO TABS: 1.0000 | ORAL_TABLET | Freq: Every day | ORAL | 3 refills | Status: DC

## 2022-04-01 NOTE — Patient Instructions (Signed)
Nice to see you today ?I will be in touch with the lab results once I have them ?Follow up with me in 6 months, sooner if you need me ? ?Recommend that you do 30 mins of exercise 5 days a week ?Also try to avoid sugar and limit carb intake ?

## 2022-04-01 NOTE — Assessment & Plan Note (Signed)
Previous primary care was her OB/GYN establishing primary care provider has been seeing endocrinologist ?

## 2022-04-01 NOTE — Assessment & Plan Note (Signed)
Patient recently tried [facility] in the past with adverse drug events.  States she titrated up to 1 mg and started having intolerable side effects.  Did review Mediterranean diet and increasing physical activity.  We will pend patient's labs prior to making a decision.  We may be able to try a lower dose Wegovy to see if she tolerates it well.  Patient states she had been on phentermine in the past but when she came off medication gained what the weight back and then some.  Continue working on lifestyle modifications ?

## 2022-04-01 NOTE — Assessment & Plan Note (Signed)
Currently has 5 children unsure if she wants more in the future.  States she had IUD but likes the idea of OCP better.  Patient's pregnancy test negative in office continue OCP as prescribed.  Patient currently menstrual period did tell her she can start the medication after she stops her.  Needs to use backup contraception for 7 to 10 days thereafter. ?

## 2022-04-01 NOTE — Assessment & Plan Note (Addendum)
Patient was followed by Dr. Romero Belling who is retiring.  Patient maintained on 200 mcg of levothyroxine daily.  Patient taking medication correctly per her report.  Pending TSH lab today continue taking levothyroxine 200 mcg daily.  Last ultrasound of thyroid was 10/2018 did not show any discrete nodules imaging reviewed ?

## 2022-04-01 NOTE — Progress Notes (Signed)
New Patient Office Visit  Subjective    Patient ID: Kristy Wright, female    DOB: 1991-08-23  Age: 31 y.o. MRN: [id number]  CC:  Chief Complaint  Patient presents with   [facility]- previous GYN but they do not take her insurance anymore   Medication Refill    Discuss refilling birth control pill   discuss weight management    Tried wegovy but she had vomiting and nausea really bad with this.    HPI Kristy Wright presents to establish care  Birth control: Last pap smear: 12/2020 and was normal no history of abnormal  Migraine with aura? No Gallbladder disease? No Smoker? No History of blood clots? NO Regular periods? yes  Obesity: Wegovy in the past approx 2-3 months. Had  gotten up to 1mg  and could no tolerat ethe medicatoin. States that at [age] she started having sympotms and  States that she would drink premir [place]in shack and a sandwich and would feel nausea with small meals. Exercise: with employment.  Hypothyroidism: Followed by Endocrine Dr Romero Belling. Everardo All retiring. Levothyroxine daily  Sleep: 8pm to 530-6 and feels rested. Does not snore  Outpatient Encounter Medications as of 04/01/2022  Medication Sig   levothyroxine (SYNTHROID) 200 MCG tablet TAKE 1 TABLET (200 MCG TOTAL) BY MOUTH DAILY BEFORE BREAKFAST.   HAILEY 24 FE 1-20 MG-MCG(24) tablet Take 1 tablet by mouth daily.   [DISCONTINUED] ciprofloxacin-dexamethasone (CIPRODEX) OTIC suspension Place 4 drops into the right ear 2 (two) times daily.   [DISCONTINUED] HAILEY 24 FE 1-20 MG-MCG(24) tablet Take 1 tablet by mouth daily. (Patient not taking: Reported on 04/01/2022)   [DISCONTINUED] levonorgestrel (MIRENA) 20 MCG/24HR IUD 1 each by Intrauterine route once.   [DISCONTINUED] Multiple Vitamins-Minerals (MULTIVITAMIN PO) Take by mouth daily.   [DISCONTINUED] ondansetron (ZOFRAN-ODT) 8 MG disintegrating tablet Take 1 tablet (8 mg total) by mouth every 8 (eight) hours as  needed for nausea or vomiting.   [DISCONTINUED] promethazine (PHENERGAN) 12.5 MG tablet Take 1 tablet (12.5 mg total) by mouth every 6 (six) hours as needed for nausea or vomiting.   [DISCONTINUED] Vitamin D, Ergocalciferol, (DRISDOL) 1.25 MG (50000 UT) CAPS capsule Take 1 capsule (50,000 Units total) by mouth every 7 (seven) days.   No facility-administered encounter medications on file as of 04/01/2022.    Past Medical History:  Diagnosis Date   Allergy 03/16/22   GERD (gastroesophageal reflux disease)    HYPOTHYROIDISM 03/13/2009   Pyelonephritis    Thyroid activity decreased    Thyroid activity decreased     No past surgical history on file.  Family History  Problem Relation Age of Onset   Liver disease Father    Hypertension Father    Diabetes Father    Thyroid disease Sister        uncertain type   Hypertension Sister    Thyroid disease Brother    Healthy Daughter    Healthy Son    Healthy Son    Healthy Son    Healthy Son    Diabetes Maternal Grandmother    Heart disease Maternal Grandmother    Hypertension Maternal Grandmother    Stroke Maternal Grandmother    Cancer Paternal Grandmother        breast   Hypertension Paternal Grandmother    Arthritis Paternal Grandmother    Thyroid disease Paternal Grandmother    Diabetes Paternal Grandfather    Hypertension Paternal Grandfather    Stroke Other  Social History   Socioeconomic History   Marital status: Married    Spouse name: Not on file   Number of children: 5   Years of education: Not on file   Highest education level: Not on file  Occupational History   Not on file  Tobacco Use   Smoking status: Never   Smokeless tobacco: Never  Vaping Use   Vaping Use: Never used  Substance and Sexual Activity   Alcohol use: Yes    Alcohol/week: 6.0 standard drinks    Types: 6 Cans of beer per week    Comment: beer 2-3 a week   Drug use: No   Sexual activity: Yes    Birth control/protection: None  Other  Topics Concern   Not on file  Social History Narrative   At age 34 began living with her older sister. States her mom was never really involved and she doesn't get along with her father. States they were "crazy parents". Doesn't know their history. She has 3 sons born in 2006, 2007, and 2009. Works at [employer]. Completed 10th grade but did not graduate from high school.         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 Negative for chills and fever.  Respiratory:  Negative for cough and shortness of breath.   Cardiovascular:  Negative for chest pain and leg swelling.  Gastrointestinal:  Negative for diarrhea, nausea and vomiting.       Bm daily  Genitourinary:  Negative for dysuria and hematuria.  Neurological:  Positive for tingling (carpal in the right and some left). Negative for dizziness, seizures, weakness and headaches.  Psychiatric/Behavioral:  Negative for hallucinations and suicidal ideas.        Objective    BP 128/78   Pulse 80   Temp (!) 97.5 F (36.4 C)   Resp 12   Ht 5\' 3"  (1.6 m)   Wt 237 lb 7 oz (107.7 kg)   LMP 03/31/2022   SpO2 96%   BMI 42.06 kg/m   Physical Exam Vitals and nursing note reviewed.  Constitutional:      Appearance: Normal appearance. She is obese.  HENT:     Right Ear: Tympanic membrane, ear canal and external ear normal.     Left Ear: Tympanic membrane, ear canal and external ear normal.     Mouth/Throat:     Mouth: Mucous membranes are moist.     Pharynx: Oropharynx is clear.  Eyes:     Extraocular Movements: Extraocular movements intact.     Pupils: Pupils  are equal, round, and reactive to light.  Cardiovascular:     Rate and Rhythm: Normal rate and regular rhythm.     Pulses: Normal pulses.     Heart sounds: Normal heart sounds.  Pulmonary:     Effort: Pulmonary effort is normal.     Breath sounds: Normal breath sounds.  Abdominal:     General: Bowel sounds are normal. There is no distension.     Palpations: There is no mass.     Tenderness: There is abdominal tenderness.  Hernia: No hernia is present.  Musculoskeletal:     Right lower leg: No edema.     Left lower leg: No edema.  Lymphadenopathy:     Cervical: No cervical adenopathy.  Skin:    General: Skin is warm.  Neurological:     General: No focal deficit present.     Mental Status: She is alert.     Comments: Bilateral upper and lower extremity strength 5/5        Assessment & Plan:   Problem List Items Addressed This Visit       Endocrine   Hypothyroidism    Patient was followed by Dr. Romero Belling who is retiring.  Patient maintained on 200 mcg of levothyroxine daily.  Patient taking medication correctly per her report.  Pending TSH lab today continue taking levothyroxine 200 mcg daily.  Last ultrasound of thyroid was 10/2018 did not show any discrete nodules imaging reviewed       Relevant Orders   TSH     Other   Establishing care with new doctor, encounter for - Primary    Previous primary care was her OB/GYN establishing primary care provider has been seeing endocrinologist       Class 3 severe obesity due to excess calories without serious comorbidity with body mass index (BMI) of 40.0 to 44.9 in adult [facility])    Patient recently tried [facility] in the past with adverse drug events.  States she titrated up to 1 mg and started having intolerable side effects.  Did review Mediterranean diet and increasing physical activity.  We will pend patient's labs prior to making a decision.  We may be able to try a lower dose Wegovy to see if she tolerates it well.   Patient states she had been on phentermine in the past but when she came off medication gained what the weight back and then some.  Continue working on lifestyle modifications       Relevant Orders   Hemoglobin A1c   TSH   Right lower quadrant abdominal pain   Relevant Orders   CBC with Differential/Platelet   Comprehensive metabolic panel   Lipase   Encounter for surveillance of contraceptive pills    Currently has 5 children unsure if she wants more in the future.  States she had IUD but likes the idea of OCP better.  Patient's pregnancy test negative in office continue OCP as prescribed.  Patient currently menstrual period did tell her she can start the medication after she stops her.  Needs to use backup contraception for 7 to 10 days thereafter.       Relevant Medications   HAILEY 24 FE 1-20 MG-MCG(24) tablet   Other Relevant Orders   POCT urine pregnancy (Completed)    Return in about 6 months (around 10/02/2022).   Audria Nine, NP

## 2022-04-02 ENCOUNTER — Encounter: Payer: Self-pay | Admitting: Nurse Practitioner

## 2022-04-02 ENCOUNTER — Emergency Department: Payer: BC Managed Care – PPO

## 2022-04-02 ENCOUNTER — Emergency Department
Admission: EM | Admit: 2022-04-02 | Discharge: 2022-04-02 | Disposition: A | Discharge status: Home / Self Care | Payer: BC Managed Care – PPO | Attending: Emergency Medicine | Admitting: Emergency Medicine

## 2022-04-02 ENCOUNTER — Other Ambulatory Visit: Payer: Self-pay

## 2022-04-02 DIAGNOSIS — N12 Tubulo-interstitial nephritis, not specified as acute or chronic: Secondary | ICD-10-CM | POA: Diagnosis not present

## 2022-04-02 DIAGNOSIS — E039 Hypothyroidism, unspecified: Secondary | ICD-10-CM | POA: Diagnosis not present

## 2022-04-02 DIAGNOSIS — K219 Gastro-esophageal reflux disease without esophagitis: Secondary | ICD-10-CM | POA: Diagnosis not present

## 2022-04-02 DIAGNOSIS — R1031 Right lower quadrant pain: Secondary | ICD-10-CM | POA: Diagnosis not present

## 2022-04-02 DIAGNOSIS — N10 Acute tubulo-interstitial nephritis: Secondary | ICD-10-CM | POA: Insufficient documentation

## 2022-04-02 LAB — CBC
HCT: 39.8 % (ref 36.0–46.0)
Hemoglobin: 12.7 g/dL (ref 12.0–15.0)
MCH: 28.2 pg (ref 26.0–34.0)
MCHC: 31.9 g/dL (ref 30.0–36.0)
MCV: 88.4 fL (ref 80.0–100.0)
Platelets: 283 10*3/uL (ref 150–400)
RBC: 4.5 MIL/uL (ref 3.87–5.11)
RDW: 13.3 % (ref 11.5–15.5)
WBC: 8.8 10*3/uL (ref 4.0–10.5)
nRBC: 0 % (ref 0.0–0.2)

## 2022-04-02 LAB — URINALYSIS, ROUTINE W REFLEX MICROSCOPIC
Bilirubin Urine: NEGATIVE
Glucose, UA: NEGATIVE mg/dL
Ketones, ur: NEGATIVE mg/dL
Nitrite: NEGATIVE
Protein, ur: 30 mg/dL — AB
Specific Gravity, Urine: 1.017 (ref 1.005–1.030)
pH: 5 (ref 5.0–8.0)

## 2022-04-02 LAB — COMPREHENSIVE METABOLIC PANEL
ALT: 19 U/L (ref 0–35)
ALT: 23 U/L (ref 0–44)
AST: 15 U/L (ref 0–37)
AST: 18 U/L (ref 15–41)
Albumin: 4.2 g/dL (ref 3.5–5.2)
Albumin: 4.3 g/dL (ref 3.5–5.0)
Alkaline Phosphatase: 81 U/L (ref 39–117)
Alkaline Phosphatase: 67 U/L (ref 38–126)
Anion gap: 8 (ref 5–15)
BUN: 20 mg/dL (ref 6–23)
BUN: 22 mg/dL — ABNORMAL HIGH (ref 6–20)
CO2: 27 mEq/L (ref 19–32)
CO2: 27 mmol/L (ref 22–32)
Calcium: 9.2 mg/dL (ref 8.4–10.5)
Calcium: 9.7 mg/dL (ref 8.9–10.3)
Chloride: 102 mEq/L (ref 96–112)
Chloride: 107 mmol/L (ref 98–111)
Creatinine, Ser: 0.76 mg/dL (ref 0.40–1.20)
Creatinine, Ser: 0.73 mg/dL (ref 0.44–1.00)
GFR, Estimated: >60 mL/min (ref >60)
GFR: 103.45 mL/min (ref >60.00)
Glucose, Bld: 84 mg/dL (ref 70–99)
Glucose, Bld: 98 mg/dL (ref 70–99)
Potassium: 4.2 mEq/L (ref 3.5–5.1)
Potassium: 4 mmol/L (ref 3.5–5.1)
Sodium: 138 mEq/L (ref 135–145)
Sodium: 142 mmol/L (ref 135–145)
Total Bilirubin: 0.7 mg/dL (ref 0.2–1.2)
Total Bilirubin: 0.9 mg/dL (ref 0.3–1.2)
Total Protein: 7.3 g/dL (ref 6.0–8.3)
Total Protein: 8.3 g/dL — ABNORMAL HIGH (ref 6.5–8.1)

## 2022-04-02 LAB — CBC WITH DIFFERENTIAL/PLATELET
Basophils Absolute: 0 10*3/uL (ref 0.0–0.1)
Basophils Relative: 0.6 % (ref 0.0–3.0)
Eosinophils Absolute: 0.2 10*3/uL (ref 0.0–0.7)
Eosinophils Relative: 2.3 % (ref 0.0–5.0)
HCT: 37.1 % (ref 36.0–46.0)
Hemoglobin: 12.4 g/dL (ref 12.0–15.0)
Lymphocytes Relative: 26.8 % (ref 12.0–46.0)
Lymphs Abs: 2.2 10*3/uL (ref 0.7–4.0)
MCHC: 33.3 g/dL (ref 30.0–36.0)
MCV: 88.6 fl (ref 78.0–100.0)
Monocytes Absolute: 0.6 10*3/uL (ref 0.1–1.0)
Monocytes Relative: 7.4 % (ref 3.0–12.0)
Neutro Abs: 5.3 10*3/uL (ref 1.4–7.7)
Neutrophils Relative %: 62.9 % (ref 43.0–77.0)
Platelets: 270 10*3/uL (ref 150.0–400.0)
RBC: 4.19 Mil/uL (ref 3.87–5.11)
RDW: 14.1 % (ref 11.5–15.5)
WBC: 8.4 10*3/uL (ref 4.0–10.5)

## 2022-04-02 LAB — HEMOGLOBIN A1C: Hgb A1c MFr Bld: 5.3 % (ref 4.6–6.5)

## 2022-04-02 LAB — PREGNANCY, URINE: Preg Test, Ur: NEGATIVE

## 2022-04-02 LAB — TSH: TSH: 2.67 u[IU]/mL (ref 0.35–5.50)

## 2022-04-02 LAB — LIPASE, BLOOD: Lipase: 33 U/L (ref 11–51)

## 2022-04-02 LAB — LIPASE: Lipase: 28 U/L (ref 11.0–59.0)

## 2022-04-02 LAB — POC URINE PREG, ED: Preg Test, Ur: NEGATIVE

## 2022-04-02 MED ORDER — IOHEXOL 350 MG/ML SOLN
100.0000 mL | Freq: Once | INTRAVENOUS | Status: AC | PRN
  Administered 2022-04-02: 100 mL via INTRAVENOUS
  Filled 2022-04-02: qty 100

## 2022-04-02 MED ORDER — ONDANSETRON HCL 4 MG/2ML IJ SOLN
4.0000 mg | Freq: Once | INTRAMUSCULAR | Status: AC
  Administered 2022-04-02: 4 mg via INTRAVENOUS
  Filled 2022-04-02: qty 2

## 2022-04-02 MED ORDER — SODIUM CHLORIDE 0.9 % IV BOLUS
1000.0000 mL | Freq: Once | INTRAVENOUS | Status: AC
  Administered 2022-04-02: 1000 mL via INTRAVENOUS

## 2022-04-02 MED ORDER — MORPHINE SULFATE (PF) 4 MG/ML IV SOLN
4.0000 mg | Freq: Once | INTRAVENOUS | Status: AC
  Administered 2022-04-02: 4 mg via INTRAVENOUS
  Filled 2022-04-02: qty 1

## 2022-04-02 MED ORDER — CEFDINIR 300 MG PO CAPS: 300.0000 mg | ORAL_CAPSULE | Freq: Two times a day (BID) | ORAL | 0 refills | Status: DC

## 2022-04-02 MED ORDER — SODIUM CHLORIDE 0.9 % IV SOLN
1.0000 g | Freq: Once | INTRAVENOUS | Status: AC
  Administered 2022-04-02: 1 g via INTRAVENOUS
  Filled 2022-04-02: qty 10

## 2022-04-02 MED ORDER — KETOROLAC TROMETHAMINE 15 MG/ML IJ SOLN
15.0000 mg | Freq: Once | INTRAMUSCULAR | Status: AC
  Administered 2022-04-02: 15 mg via INTRAVENOUS
  Filled 2022-04-02: qty 1

## 2022-04-02 MED ORDER — ONDANSETRON HCL 4 MG PO TABS: 4.0000 mg | ORAL_TABLET | Freq: Every day | ORAL | 1 refills | Status: DC | PRN

## 2022-04-02 NOTE — ED Provider Notes (Signed)
? ?[facility] ?Provider Note ? ? ? Event Date/Time  ? First MD Initiated Contact with Patient 04/02/22 1928   ?  (approximate) ? ? ?History  ? ?Abdominal Pain ? ? ?HPI ? ?ABRIELA BEFORT is a 31 y.o. female past medical history of GERD prior pyelonephritis presents with right lower quadrant abdominal pain.  Symptoms started yesterday but have gotten worse.  She has pain that is sharp in the right lower quadrant radiating to the right low back.  It is constant.  Has had decreased appetite nausea but no vomiting no diarrhea constipation has had chills subjective fever at home did not take temp.  Denies abnormal vaginal discharge dysuria urgency frequency.  No history of similar. ?  ? ?Past Medical History:  ?Diagnosis Date  ? Allergy 03/16/22  ? GERD (gastroesophageal reflux disease)   ? HYPOTHYROIDISM 03/13/2009  ? Pyelonephritis   ? Thyroid activity decreased   ? Thyroid activity decreased   ? ? ?Patient Active Problem List  ? Diagnosis Date Noted  ? Establishing care with new doctor, encounter for 04/01/2022  ? Class 3 severe obesity due to excess calories without serious comorbidity with body mass index (BMI) of 40.0 to 44.9 in adult [facility]) 04/01/2022  ? Right lower quadrant abdominal pain 04/01/2022  ? Encounter for surveillance of contraceptive pills 04/01/2022  ? Cramp in limb 12/20/2019  ? Vitamin D deficiency 12/24/2018  ? Eye swelling, bilateral 10/28/2018  ? Weight gain 07/07/2018  ? Hypertriglyceridemia 06/12/2017  ? Back pain, chronic 08/26/2013  ? IUD (intrauterine device) in place 08/26/2013  ? Thyroid activity decreased   ? Nontoxic multinodular goiter 04/06/2011  ? Hypothyroidism 03/13/2009  ? UTI 03/13/2009  ? ? ? ?Physical Exam  ?Triage Vital Signs: ?ED Triage Vitals  ?Enc Vitals Group  ?   BP 04/02/22 1907 130/85  ?   Pulse Rate 04/02/22 1907 83  ?   Resp 04/02/22 1907 18  ?   Temp 04/02/22 1907 98.6 ?F (37 ?C)  ?   Temp Source 04/02/22 1907 Oral  ?   SpO2 04/02/22 1907 98 %  ?    Weight 04/02/22 1909 237 lb (107.5 kg)  ?   Height 04/02/22 1909 5\' 3"  (1.6 m)  ?   Head Circumference --   ?   Peak Flow --   ?   Pain Score 04/02/22 1909 10  ?   Pain Loc --   ?   Pain Edu? --   ?   Excl. in [place]? --   ? ? ?Most recent vital signs: ?Vitals:  ? 04/02/22 2158 04/02/22 2257  ?BP: 108/67 114/66  ?Pulse: 73 77  ?Resp: 16 16  ?Temp: 98.4 ?F (36.9 ?C)   ?SpO2: 98% 97%  ? ? ? ?General: Awake, appears uncomfortable ?CV:  Good peripheral perfusion.  ?Resp:  Normal effort.  ?Abd:  No distention.  Tenderness to palpation in the right lower quadrant voluntary guarding ?Neuro:             Awake, Alert, Oriented x 3  ?Other:   ? ? ?ED Results / Procedures / Treatments  ?Labs ?(all labs ordered are listed, but only abnormal results are displayed) ?Labs Reviewed  ?COMPREHENSIVE METABOLIC PANEL - Abnormal; Notable for the following components:  ?    Result Value  ? BUN 22 (*)   ? Total Protein 8.3 (*)   ? All other components within normal limits  ?URINALYSIS, ROUTINE W REFLEX MICROSCOPIC - Abnormal; Notable  for the following components:  ? Color, Urine YELLOW (*)   ? APPearance CLOUDY (*)   ? Hgb urine dipstick LARGE (*)   ? Protein, ur 30 (*)   ? Leukocytes,Ua TRACE (*)   ? Bacteria, UA MANY (*)   ? All other components within normal limits  ?URINE CULTURE  ?LIPASE, BLOOD  ?CBC  ?PREGNANCY, URINE  ?POC URINE PREG, ED  ? ? ? ?EKG ? ? ? ? ?RADIOLOGY ?I reviewed the CT abdomen pelvis which is negative for acute pathology ? ? ?PROCEDURES: ? ?Critical Care performed: No ? ?Procedures ? ? ? ?MEDICATIONS ORDERED IN ED: ?Medications  ?sodium chloride 0.9 % bolus 1,000 mL (0 mLs Intravenous Stopped 04/02/22 2252)  ?morphine (PF) 4 MG/ML injection 4 mg (4 mg Intravenous Given 04/02/22 2026)  ?ondansetron [facility]) injection 4 mg (4 mg Intravenous Given 04/02/22 2026)  ?iohexol (OMNIPAQUE) 350 MG/ML injection 100 mL (100 mLs Intravenous Contrast Given 04/02/22 2032)  ?cefTRIAXone (ROCEPHIN) 1 g in sodium chloride 0.9 % 100 mL IVPB  (0 g Intravenous Stopped 04/02/22 2252)  ?ketorolac (TORADOL) 15 MG/ML injection 15 mg (15 mg Intravenous Given 04/02/22 2156)  ? ? ? ?IMPRESSION / MDM / ASSESSMENT AND PLAN / ED COURSE  ?I reviewed the triage vital signs and the nursing notes. ?             ?               ? ?Differential diagnosis includes, but is not limited to, appendicitis, pyelonephritis, kidney stone, ovarian torsion ? ?Is a 31 year old female who is relatively healthy presenting with right lower quadrant abdominal pain rating to the back.  She has no urinary symptoms did have subjective fever at home.  Vitals within normal limits.  She appears quite uncomfortable on exam is tender in the right lower quadrant no CVA tenderness but there is some tenderness in the right low back.  UA has some RBCs and white cells, labs otherwise reassuring no leukocytosis normal CMP.  We will obtain a CT abdomen pelvis with contrast to further evaluate for appendicitis versus stone versus Pyelo.  We will treat pain with morphine and give her IV fluids and Zofran. ? ?Patient CT is negative for appendicitis does not show any Pilo or any other obvious source of her pain.  UA does have some WBCs but also significant squames.  Unclear if this represents true UTI but with no other explanation for her flank/right lower abdominal pain we will treat as pyelonephritis.  She was given a dose of Rocephin in the ED will discharge with 7 days of cefdinir.  Urine culture is in process. We discussed return precautions.  ? ?  ? ? ?FINAL CLINICAL IMPRESSION(S) / ED DIAGNOSES  ? ?Final diagnoses:  ?Pyelonephritis  ? ? ? ?Rx / DC Orders  ? ?ED Discharge Orders   ? ?      Ordered  ?  cefdinir (OMNICEF) 300 MG capsule  2 times daily       ? 04/02/22 2212  ?  ondansetron (ZOFRAN) 4 MG tablet  Daily PRN       ? 04/02/22 2213  ? ?  ?  ? ?  ? ? ? ?Note:  This document was prepared using Dragon voice recognition software and may include unintentional dictation errors. ?  ?Rada Hay,  MD ?04/02/22 2313 ? ?

## 2022-04-02 NOTE — ED Provider Triage Note (Signed)
Emergency Medicine Provider Triage Evaluation Note ? ?Kristy Wright , a 31 y.o. female  was evaluated in triage.  Pt complains of right lower abdominal pain that radiates into her back/flank. ?Patient reports that the pain started last night and that it is sharp/stabbing in nature.  She reports that she has been febrile however she has not had fever in triage.  Patient denies any urinary symptoms such as dysuria/urinary frequency/urgency/burning/hematuria.  Patient denies history of kidney stones. ?Patient denies any history of ovarian cyst. ?She does endorse nausea however denies vomiting. ? ?Review of Systems  ?Positive: Right lower quadrant pain radiating into the right flank/back. ?Negative: Negative for urinary symptoms/fever/vomiting/diarrhea ? ?Physical Exam  ?BP 130/85 (BP Location: Right Arm)   Pulse 83   Temp 98.6 ?F (37 ?C) (Oral)   Resp 18   Ht 5\' 3"  (1.6 m)   Wt 107.5 kg   LMP 03/31/2022   SpO2 98%   BMI 41.98 kg/m?  ?Gen:   Awake, no distress   ?Resp:  Normal effort  ?MSK:   Moves extremities without difficulty  ?Other:   ? ?Medical Decision Making  ?Medically screening exam initiated at 7:10 PM.  Appropriate orders placed.  Kristy Wright was informed that the remainder of the evaluation will be completed by another provider, this initial triage assessment does not replace that evaluation, and the importance of remaining in the ED until their evaluation is complete. ? ?Routine triage labs placed. ?  ?Kristy Campi, NP ?04/02/22 1913 ? ?

## 2022-04-02 NOTE — ED Triage Notes (Signed)
Ambulatory to triage with RLQ abd pain, radiating to lower back x 2 days. Sharp in nature, constant. Denies urinary sx or N/V/D. Reports fever at home.  ?

## 2022-04-02 NOTE — Discharge Instructions (Addendum)
Your CAT scan was normal.  Your urine sample does show white blood cells which could indicate that you have a kidney infection.  Please take the antibiotic twice a day for the next 7 days.  If you develop fevers worsening pain or unable to eat or drink please return to the emergency department. ?

## 2022-04-03 ENCOUNTER — Other Ambulatory Visit: Payer: Self-pay | Admitting: Nurse Practitioner

## 2022-04-03 MED ORDER — WEGOVY 0.25 MG/0.5ML ~~LOC~~ SOAJ: 0.2500 mg | SUBCUTANEOUS | 1 refills | Status: DC

## 2022-04-03 NOTE — Progress Notes (Signed)
Orders only

## 2022-04-05 LAB — URINE CULTURE: Culture: >=100000 — AB

## 2022-04-10 ENCOUNTER — Other Ambulatory Visit: Payer: Self-pay | Admitting: Endocrinology

## 2022-04-17 DIAGNOSIS — G5603 Carpal tunnel syndrome, bilateral upper limbs: Secondary | ICD-10-CM | POA: Insufficient documentation

## 2022-04-24 DIAGNOSIS — G5603 Carpal tunnel syndrome, bilateral upper limbs: Secondary | ICD-10-CM | POA: Diagnosis not present

## 2022-04-26 ENCOUNTER — Encounter (HOSPITAL_BASED_OUTPATIENT_CLINIC_OR_DEPARTMENT_OTHER): Payer: Self-pay | Admitting: Orthopedic Surgery

## 2022-04-26 ENCOUNTER — Other Ambulatory Visit: Payer: Self-pay

## 2022-05-04 NOTE — H&P (Signed)
Preoperative History & Physical Exam  Surgeon: Philipp Ovens, MD  Diagnosis: Right Carpal Tunnel Syndrome  Planned Procedure: Procedure(s) (LRB): Right Carpal Tunnel Release (Right)  History of Present Illness:   Patient is a 31 y.o. female with symptoms consistent with Right Carpal Tunnel Syndrome who presents for surgical intervention. The risks, benefits and alternatives of surgical intervention were discussed and informed consent was obtained prior to surgery.  Past Medical History:  Past Medical History:  Diagnosis Date   Allergy 03/16/22   GERD (gastroesophageal reflux disease)    HYPOTHYROIDISM 03/13/2009   Pyelonephritis    Thyroid activity decreased    Thyroid activity decreased     Past Surgical History: History reviewed. No pertinent surgical history.  Medications:  Prior to Admission medications   Medication Sig Start Date End Date Taking? Authorizing Provider  HAILEY 24 FE 1-20 MG-MCG(24) tablet Take 1 tablet by mouth daily. 04/01/22   Eden Emms, NP  levothyroxine (SYNTHROID) 200 MCG tablet TAKE 1 TABLET (200 MCG TOTAL) BY MOUTH DAILY BEFORE BREAKFAST. 04/10/22   Carlus Pavlov, MD    Allergies:  Patient has no active allergies.  Review of Systems: Negative except per HPI.  Physical Exam: Alert and oriented, NAD Head and neck: no masses, normal alignment CV: pulse intact Pulm: no increased work of breathing, respirations even and unlabored Abdomen: non-distended Extremities: extremities warm and well perfused  LABS: Recent Results (from the past 2160 hour(s))  Comprehensive metabolic panel     Status: Abnormal   Collection Time: 02/05/22  7:56 AM  Result Value Ref Range   Sodium 139 135 - 145 mmol/L   Potassium 3.6 3.5 - 5.1 mmol/L   Chloride 106 98 - 111 mmol/L   CO2 27 22 - 32 mmol/L   Glucose, Bld 100 (H) 70 - 99 mg/dL    Comment: Glucose reference range applies only to samples taken after fasting for at least 8 hours.   BUN 13 6 - 20  mg/dL   Creatinine, Ser 0.98 0.44 - 1.00 mg/dL   Calcium 8.6 (L) 8.9 - 10.3 mg/dL   Total Protein 7.2 6.5 - 8.1 g/dL   Albumin 3.6 3.5 - 5.0 g/dL   AST 25 15 - 41 U/L   ALT 26 0 - 44 U/L   Alkaline Phosphatase 55 38 - 126 U/L   Total Bilirubin 0.6 0.3 - 1.2 mg/dL   GFR, Estimated >11 >91 mL/min    Comment: (NOTE) Calculated using the CKD-EPI Creatinine Equation (2021)    Anion gap 6 5 - 15    Comment: Performed at [facility], [address] [place], [place] [address]  Lipase, blood     Status: None   Collection Time: 02/05/22  7:56 AM  Result Value Ref Range   Lipase 34 11 - 51 U/L    Comment: Performed at [facility], [address] [place], [place] [address]  CBC with Differential     Status: None   Collection Time: 02/05/22  7:56 AM  Result Value Ref Range   WBC 6.5 4.0 - 10.5 K/uL   RBC 4.22 3.87 - 5.11 MIL/uL   Hemoglobin 12.4 12.0 - 15.0 g/dL   HCT 08.6 57.8 - 46.9 %   MCV 89.6 80.0 - 100.0 fL   MCH 29.4 26.0 - 34.0 pg   MCHC 32.8 30.0 - 36.0 g/dL   RDW 62.9 52.8 - 41.3 %   Platelets 257 150 - 400 K/uL   nRBC 0.0 0.0 -  0.2 %   Neutrophils Relative % 48 %   Neutro Abs 3.2 1.7 - 7.7 K/uL   Lymphocytes Relative 41 %   Lymphs Abs 2.7 0.7 - 4.0 K/uL   Monocytes Relative 8 %   Monocytes Absolute 0.5 0.1 - 1.0 K/uL   Eosinophils Relative 2 %   Eosinophils Absolute 0.1 0.0 - 0.5 K/uL   Basophils Relative 1 %   Basophils Absolute 0.0 0.0 - 0.1 K/uL   Immature Granulocytes 0 %   Abs Immature Granulocytes 0.02 0.00 - 0.07 K/uL    Comment: Performed at [facility], [address] [place], [place] [address]  Resp Panel by RT-PCR (Flu A&B, Covid) Nasopharyngeal Swab     Status: None   Collection Time: 02/05/22  8:12 AM   Specimen: Nasopharyngeal Swab; Nasopharyngeal(NP) swabs in vial transport medium  Result Value Ref Range   SARS Coronavirus 2 by RT PCR NEGATIVE NEGATIVE    Comment: (NOTE) SARS-CoV-2 target nucleic acids are NOT  DETECTED.  The SARS-CoV-2 RNA is generally detectable in upper respiratory specimens during the acute phase of infection. The lowest concentration of SARS-CoV-2 viral copies this assay can detect is 138 copies/mL. A negative result does not preclude SARS-Cov-2 infection and should not be used as the sole basis for treatment or other patient management decisions. A negative result may occur with  improper specimen collection/handling, submission of specimen other than nasopharyngeal swab, presence of viral mutation(s) within the areas targeted by this assay, and inadequate number of viral copies(<138 copies/mL). A negative result must be combined with clinical observations, patient history, and epidemiological information. The expected result is Negative.  Fact Sheet for Patients:  BloggerCourse.com  Fact Sheet for Healthcare Providers:  SeriousBroker.it  This test is no t yet approved or cleared by the [place] FDA and  has been authorized for detection and/or diagnosis of SARS-CoV-2 by FDA under an Emergency Use Authorization (EUA). This EUA will remain  in effect (meaning this test can be used) for the duration of the COVID-19 declaration under Section 564(b)(1) of the Act, 21 U.S.C.section 360bbb-3(b)(1), unless the authorization is terminated  or revoked sooner.       Influenza A by PCR NEGATIVE NEGATIVE   Influenza B by PCR NEGATIVE NEGATIVE    Comment: (NOTE) The Xpert Xpress SARS-CoV-2/FLU/RSV plus assay is intended as an aid in the diagnosis of influenza from Nasopharyngeal swab specimens and should not be used as a sole basis for treatment. Nasal washings and aspirates are unacceptable for Xpert Xpress SARS-CoV-2/FLU/RSV testing.  Fact Sheet for Patients: BloggerCourse.com  Fact Sheet for Healthcare Providers: SeriousBroker.it  This test is not yet approved or  cleared by the [place] FDA and has been authorized for detection and/or diagnosis of SARS-CoV-2 by FDA under an Emergency Use Authorization (EUA). This EUA will remain in effect (meaning this test can be used) for the duration of the COVID-19 declaration under Section 564(b)(1) of the Act, 21 U.S.C. section 360bbb-3(b)(1), unless the authorization is terminated or revoked.  Performed at [facility], [address] [place], [place] [address]   Basic metabolic panel     Status: Abnormal   Collection Time: 02/11/22  9:51 AM  Result Value Ref Range   Sodium 138 135 - 145 mmol/L   Potassium 3.9 3.5 - 5.1 mmol/L   Chloride 106 98 - 111 mmol/L   CO2 24 22 - 32 mmol/L   Glucose, Bld 93 70 - 99 mg/dL    Comment:  Glucose reference range applies only to samples taken after fasting for at least 8 hours.   BUN 16 6 - 20 mg/dL   Creatinine, Ser 4.090.81 0.44 - 1.00 mg/dL   Calcium 8.8 (L) 8.9 - 10.3 mg/dL   GFR, Estimated >81>60 >19>60 mL/min    Comment: (NOTE) Calculated using the CKD-EPI Creatinine Equation (2021)    Anion gap 8 5 - 15    Comment: Performed at [facility], [address] [place], [place] [address]  CBC     Status: None   Collection Time: 02/11/22  9:51 AM  Result Value Ref Range   WBC 6.0 4.0 - 10.5 K/uL   RBC 4.45 3.87 - 5.11 MIL/uL   Hemoglobin 12.9 12.0 - 15.0 g/dL   HCT 95.639.8 21.336.0 - 08.646.0 %   MCV 89.4 80.0 - 100.0 fL   MCH 29.0 26.0 - 34.0 pg   MCHC 32.4 30.0 - 36.0 g/dL   RDW 57.812.9 46.911.5 - 62.915.5 %   Platelets 261 150 - 400 K/uL   nRBC 0.0 0.0 - 0.2 %    Comment: Performed at [facility], [address] [place], [place] [address]  Urinalysis, Routine w reflex microscopic     Status: Abnormal   Collection Time: 02/11/22 10:37 AM  Result Value Ref Range   Color, Urine YELLOW (A) YELLOW   APPearance CLOUDY (A) CLEAR   Specific Gravity, Urine 1.020 1.005 - 1.030   pH 5.0 5.0 - 8.0   Glucose, UA NEGATIVE NEGATIVE mg/dL   Hgb urine  dipstick NEGATIVE NEGATIVE   Bilirubin Urine NEGATIVE NEGATIVE   Ketones, ur NEGATIVE NEGATIVE mg/dL   Protein, ur NEGATIVE NEGATIVE mg/dL   Nitrite NEGATIVE NEGATIVE   Leukocytes,Ua LARGE (A) NEGATIVE   RBC / HPF 6-10 0 - 5 RBC/hpf   WBC, UA 11-20 0 - 5 WBC/hpf   Bacteria, UA RARE (A) NONE SEEN   Squamous Epithelial / LPF 11-20 0 - 5   Mucus PRESENT     Comment: Performed at [facility], [address] [place], [place] [address]  POC urine preg, ED     Status: None   Collection Time: 02/11/22 10:43 AM  Result Value Ref Range   Preg Test, Ur NEGATIVE NEGATIVE    Comment:        THE SENSITIVITY OF THIS METHODOLOGY IS >24 mIU/mL   CBC with Differential/Platelet     Status: None   Collection Time: 04/01/22  2:49 PM  Result Value Ref Range   WBC 8.4 4.0 - 10.5 K/uL   RBC 4.19 3.87 - 5.11 Mil/uL   Hemoglobin 12.4 12.0 - 15.0 g/dL   HCT 10.237.1 72.536.0 - 36.646.0 %   MCV 88.6 78.0 - 100.0 fl   MCHC 33.3 30.0 - 36.0 g/dL   RDW 44.014.1 34.711.5 - 42.515.5 %   Platelets 270.0 150.0 - 400.0 K/uL   Neutrophils Relative % 62.9 43.0 - 77.0 %   Lymphocytes Relative 26.8 12.0 - 46.0 %   Monocytes Relative 7.4 3.0 - 12.0 %   Eosinophils Relative 2.3 0.0 - 5.0 %   Basophils Relative 0.6 0.0 - 3.0 %   Neutro Abs 5.3 1.4 - 7.7 K/uL   Lymphs Abs 2.2 0.7 - 4.0 K/uL   Monocytes Absolute 0.6 0.1 - 1.0 K/uL   Eosinophils Absolute 0.2 0.0 - 0.7 K/uL   Basophils Absolute 0.0 0.0 - 0.1 K/uL  Comprehensive metabolic panel     Status: None   Collection  Time: 04/01/22  2:49 PM  Result Value Ref Range   Sodium 138 135 - 145 mEq/L   Potassium 4.2 3.5 - 5.1 mEq/L   Chloride 102 96 - 112 mEq/L   CO2 27 19 - 32 mEq/L   Glucose, Bld 84 70 - 99 mg/dL   BUN 20 6 - 23 mg/dL   Creatinine, Ser 1.61 0.40 - 1.20 mg/dL   Total Bilirubin 0.7 0.2 - 1.2 mg/dL   Alkaline Phosphatase 81 39 - 117 U/L   AST 15 0 - 37 U/L   ALT 19 0 - 35 U/L   Total Protein 7.3 6.0 - 8.3 g/dL   Albumin 4.2 3.5 - 5.2 g/dL   GFR 096.04  >54.09 mL/min    Comment: Calculated using the CKD-EPI Creatinine Equation (2021)   Calcium 9.2 8.4 - 10.5 mg/dL  Hemoglobin W1X     Status: None   Collection Time: 04/01/22  2:49 PM  Result Value Ref Range   Hgb A1c MFr Bld 5.3 4.6 - 6.5 %    Comment: Glycemic Control Guidelines for People with Diabetes:Non Diabetic:  <6%Goal of Therapy: <7%Additional Action Suggested:  >8%   TSH     Status: None   Collection Time: 04/01/22  2:49 PM  Result Value Ref Range   TSH 2.67 0.35 - 5.50 uIU/mL  Lipase     Status: None   Collection Time: 04/01/22  2:49 PM  Result Value Ref Range   Lipase 28.0 11.0 - 59.0 U/L  POCT urine pregnancy     Status: Normal   Collection Time: 04/01/22  3:44 PM  Result Value Ref Range   Preg Test, Ur Negative Negative  Lipase, blood     Status: None   Collection Time: 04/02/22  7:11 PM  Result Value Ref Range   Lipase 33 11 - 51 U/L    Comment: Performed at [facility], [address] [place], [place] [address]  Comprehensive metabolic panel     Status: Abnormal   Collection Time: 04/02/22  7:11 PM  Result Value Ref Range   Sodium 142 135 - 145 mmol/L   Potassium 4.0 3.5 - 5.1 mmol/L   Chloride 107 98 - 111 mmol/L   CO2 27 22 - 32 mmol/L   Glucose, Bld 98 70 - 99 mg/dL    Comment: Glucose reference range applies only to samples taken after fasting for at least 8 hours.   BUN 22 (H) 6 - 20 mg/dL   Creatinine, Ser 2.95 0.44 - 1.00 mg/dL   Calcium 9.7 8.9 - 62.1 mg/dL   Total Protein 8.3 (H) 6.5 - 8.1 g/dL   Albumin 4.3 3.5 - 5.0 g/dL   AST 18 15 - 41 U/L   ALT 23 0 - 44 U/L   Alkaline Phosphatase 67 38 - 126 U/L   Total Bilirubin 0.9 0.3 - 1.2 mg/dL   GFR, Estimated >30 >86 mL/min    Comment: (NOTE) Calculated using the CKD-EPI Creatinine Equation (2021)    Anion gap 8 5 - 15    Comment: Performed at [facility], [address] [place], [place] [address]  CBC     Status: None   Collection Time: 04/02/22  7:11 PM  Result Value  Ref Range   WBC 8.8 4.0 - 10.5 K/uL   RBC 4.50 3.87 - 5.11 MIL/uL   Hemoglobin 12.7 12.0 - 15.0 g/dL   HCT 96.2 95.2 - 84.1 %   MCV 88.4 80.0 - 100.0  fL   MCH 28.2 26.0 - 34.0 pg   MCHC 31.9 30.0 - 36.0 g/dL   RDW 16.1 09.6 - 04.5 %   Platelets 283 150 - 400 K/uL   nRBC 0.0 0.0 - 0.2 %    Comment: Performed at [facility], [address] [place], [place] [address]  Urinalysis, Routine w reflex microscopic Urine, Clean Catch     Status: Abnormal   Collection Time: 04/02/22  7:11 PM  Result Value Ref Range   Color, Urine YELLOW (A) YELLOW   APPearance CLOUDY (A) CLEAR   Specific Gravity, Urine 1.017 1.005 - 1.030   pH 5.0 5.0 - 8.0   Glucose, UA NEGATIVE NEGATIVE mg/dL   Hgb urine dipstick LARGE (A) NEGATIVE   Bilirubin Urine NEGATIVE NEGATIVE   Ketones, ur NEGATIVE NEGATIVE mg/dL   Protein, ur 30 (A) NEGATIVE mg/dL   Nitrite NEGATIVE NEGATIVE   Leukocytes,Ua TRACE (A) NEGATIVE   RBC / HPF 11-20 0 - 5 RBC/hpf   WBC, UA 11-20 0 - 5 WBC/hpf   Bacteria, UA MANY (A) NONE SEEN   Squamous Epithelial / LPF 0-5 0 - 5   Mucus PRESENT     Comment: Performed at [facility], [address] [place], [place] [address]  POC urine preg, ED     Status: None   Collection Time: 04/02/22  7:14 PM  Result Value Ref Range   Preg Test, Ur NEGATIVE NEGATIVE    Comment:        THE SENSITIVITY OF THIS METHODOLOGY IS >24 mIU/mL   Urine Culture     Status: Abnormal   Collection Time: 04/02/22  7:17 PM   Specimen: Urine, Random  Result Value Ref Range   Specimen Description      URINE, RANDOM Performed at [facility], [address] [place], [place] [address]    Special Requests      NONE Performed at [facility], [address] [place], [place] [address]    Culture >=100,000 COLONIES/mL ESCHERICHIA COLI (A)    Report Status 04/05/2022 FINAL    Organism ID, Bacteria ESCHERICHIA COLI (A)       Susceptibility   Escherichia coli - MIC*     AMPICILLIN >=32 RESISTANT Resistant     CEFAZOLIN <=4 SENSITIVE Sensitive     CEFEPIME <=0.12 SENSITIVE Sensitive     CEFTRIAXONE <=0.25 SENSITIVE Sensitive     CIPROFLOXACIN <=0.25 SENSITIVE Sensitive     GENTAMICIN <=1 SENSITIVE Sensitive     IMIPENEM <=0.25 SENSITIVE Sensitive     NITROFURANTOIN <=16 SENSITIVE Sensitive     TRIMETH/SULFA <=20 SENSITIVE Sensitive     AMPICILLIN/SULBACTAM 16 INTERMEDIATE Intermediate     PIP/TAZO <=4 SENSITIVE Sensitive     * >=100,000 COLONIES/mL ESCHERICHIA COLI  Pregnancy, urine     Status: None   Collection Time: 04/02/22  7:17 PM  Result Value Ref Range   Preg Test, Ur NEGATIVE NEGATIVE    Comment: Performed at [facility], [address] [place], [place] [address]     Complete History and Physical exam available in the office notes  Gomez Cleverly

## 2022-05-06 NOTE — Anesthesia Preprocedure Evaluation (Addendum)
Anesthesia Evaluation  Patient identified by MRN, date of birth, ID band Patient awake    Reviewed: Allergy & Precautions, NPO status , Patient's Chart, lab work & pertinent test results  History of Anesthesia Complications Negative for: history of anesthetic complications  Airway Mallampati: II  TM Distance: >3 FB Neck ROM: Full    Dental  (+) Dental Advisory Given, Teeth Intact   Pulmonary neg pulmonary ROS,    breath sounds clear to auscultation       Cardiovascular negative cardio ROS   Rhythm:Regular Rate:Normal     Neuro/Psych negative neurological ROS  negative psych ROS   GI/Hepatic Neg liver ROS, GERD  Controlled,  Endo/Other  Hypothyroidism Morbid obesityobese  Renal/GU negative Renal ROS     Musculoskeletal   Abdominal (+) + obese,   Peds  Hematology negative hematology ROS (+)   Anesthesia Other Findings   Reproductive/Obstetrics                            Anesthesia Physical Anesthesia Plan  ASA: 3  Anesthesia Plan: MAC   Post-op Pain Management: Tylenol PO (pre-op)*   Induction:   PONV Risk Score and Plan: Ondansetron and Treatment may vary due to age or medical condition  Airway Management Planned: Natural Airway and Simple Face Mask  Additional Equipment: None  Intra-op Plan:   Post-operative Plan:   Informed Consent: I have reviewed the patients History and Physical, chart, labs and discussed the procedure including the risks, benefits and alternatives for the proposed anesthesia with the patient or authorized representative who has indicated his/her understanding and acceptance.     Dental advisory given  Plan Discussed with: CRNA and Surgeon  Anesthesia Plan Comments:       Anesthesia Quick Evaluation

## 2022-05-07 ENCOUNTER — Ambulatory Visit (HOSPITAL_BASED_OUTPATIENT_CLINIC_OR_DEPARTMENT_OTHER): Payer: BC Managed Care – PPO | Admitting: Anesthesiology

## 2022-05-07 ENCOUNTER — Encounter (HOSPITAL_BASED_OUTPATIENT_CLINIC_OR_DEPARTMENT_OTHER): Payer: Self-pay | Admitting: Orthopedic Surgery

## 2022-05-07 ENCOUNTER — Other Ambulatory Visit: Payer: Self-pay

## 2022-05-07 ENCOUNTER — Ambulatory Visit (HOSPITAL_BASED_OUTPATIENT_CLINIC_OR_DEPARTMENT_OTHER)
Admission: RE | Admit: 2022-05-07 | Discharge: 2022-05-07 | Disposition: A | Discharge status: Home / Self Care | Payer: BC Managed Care – PPO | Source: Ambulatory Visit | Attending: Orthopedic Surgery | Admitting: Orthopedic Surgery

## 2022-05-07 ENCOUNTER — Encounter (HOSPITAL_BASED_OUTPATIENT_CLINIC_OR_DEPARTMENT_OTHER)
Admission: RE | Disposition: A | Discharge status: Home / Self Care | Payer: Self-pay | Source: Ambulatory Visit | Attending: Orthopedic Surgery

## 2022-05-07 DIAGNOSIS — G5601 Carpal tunnel syndrome, right upper limb: Secondary | ICD-10-CM | POA: Diagnosis not present

## 2022-05-07 DIAGNOSIS — Z6841 Body Mass Index (BMI) 40.0 and over, adult: Secondary | ICD-10-CM | POA: Insufficient documentation

## 2022-05-07 DIAGNOSIS — E039 Hypothyroidism, unspecified: Secondary | ICD-10-CM | POA: Diagnosis not present

## 2022-05-07 DIAGNOSIS — E669 Obesity, unspecified: Secondary | ICD-10-CM | POA: Diagnosis not present

## 2022-05-07 DIAGNOSIS — Z3041 Encounter for surveillance of contraceptive pills: Secondary | ICD-10-CM

## 2022-05-07 DIAGNOSIS — K219 Gastro-esophageal reflux disease without esophagitis: Secondary | ICD-10-CM | POA: Diagnosis not present

## 2022-05-07 HISTORY — PX: CARPAL TUNNEL RELEASE: SHX101

## 2022-05-07 LAB — POCT PREGNANCY, URINE: Preg Test, Ur: NEGATIVE

## 2022-05-07 SURGERY — CARPAL TUNNEL RELEASE
Anesthesia: Monitor Anesthesia Care | Site: Hand | Laterality: Right

## 2022-05-07 MED ORDER — PROPOFOL 10 MG/ML IV BOLUS
INTRAVENOUS | Status: AC
  Filled 2022-05-07: qty 20

## 2022-05-07 MED ORDER — LIDOCAINE HCL (PF) 1 % IJ SOLN
INTRAMUSCULAR | Status: DC | PRN
  Administered 2022-05-07: 5 mL

## 2022-05-07 MED ORDER — FENTANYL CITRATE (PF) 100 MCG/2ML IJ SOLN: 25.0000 ug | INTRAMUSCULAR | Status: DC | PRN

## 2022-05-07 MED ORDER — 0.9 % SODIUM CHLORIDE (POUR BTL) OPTIME
TOPICAL | Status: DC | PRN
  Administered 2022-05-07: 500 mL

## 2022-05-07 MED ORDER — LACTATED RINGERS IV SOLN: INTRAVENOUS | Status: DC

## 2022-05-07 MED ORDER — KETAMINE HCL 10 MG/ML IJ SOLN
INTRAMUSCULAR | Status: DC | PRN
  Administered 2022-05-07: 20 mg via INTRAVENOUS

## 2022-05-07 MED ORDER — MIDAZOLAM HCL 2 MG/2ML IJ SOLN: 0.5000 mg | Freq: Once | INTRAMUSCULAR | Status: DC | PRN

## 2022-05-07 MED ORDER — ONDANSETRON HCL 4 MG/2ML IJ SOLN
INTRAMUSCULAR | Status: DC | PRN
  Administered 2022-05-07: 4 mg via INTRAVENOUS

## 2022-05-07 MED ORDER — KETAMINE HCL 50 MG/5ML IJ SOSY
PREFILLED_SYRINGE | INTRAMUSCULAR | Status: AC
  Filled 2022-05-07: qty 5

## 2022-05-07 MED ORDER — FENTANYL CITRATE (PF) 100 MCG/2ML IJ SOLN
INTRAMUSCULAR | Status: AC
  Filled 2022-05-07: qty 2

## 2022-05-07 MED ORDER — OXYCODONE HCL 5 MG PO TABS: 5.0000 mg | ORAL_TABLET | Freq: Once | ORAL | Status: DC | PRN

## 2022-05-07 MED ORDER — MEPERIDINE HCL 25 MG/ML IJ SOLN: 6.2500 mg | INTRAMUSCULAR | Status: DC | PRN

## 2022-05-07 MED ORDER — OXYCODONE HCL 5 MG/5ML PO SOLN: 5.0000 mg | Freq: Once | ORAL | Status: DC | PRN

## 2022-05-07 MED ORDER — PROPOFOL 500 MG/50ML IV EMUL
INTRAVENOUS | Status: AC
  Filled 2022-05-07: qty 50

## 2022-05-07 MED ORDER — ACETAMINOPHEN 500 MG PO TABS
ORAL_TABLET | ORAL | Status: AC
  Filled 2022-05-07: qty 2

## 2022-05-07 MED ORDER — GLYCOPYRROLATE PF 0.2 MG/ML IJ SOSY
PREFILLED_SYRINGE | INTRAMUSCULAR | Status: AC
  Filled 2022-05-07: qty 1

## 2022-05-07 MED ORDER — BUPIVACAINE HCL (PF) 0.5 % IJ SOLN
INTRAMUSCULAR | Status: DC | PRN
  Administered 2022-05-07: 5 mL

## 2022-05-07 MED ORDER — MIDAZOLAM HCL 2 MG/2ML IJ SOLN
INTRAMUSCULAR | Status: AC
  Filled 2022-05-07: qty 2

## 2022-05-07 MED ORDER — PROPOFOL 500 MG/50ML IV EMUL
INTRAVENOUS | Status: DC | PRN
  Administered 2022-05-07: 200 ug/kg/min via INTRAVENOUS

## 2022-05-07 MED ORDER — MIDAZOLAM HCL 5 MG/5ML IJ SOLN
INTRAMUSCULAR | Status: DC | PRN
  Administered 2022-05-07: 2 mg via INTRAVENOUS

## 2022-05-07 MED ORDER — BACITRACIN ZINC 500 UNIT/GM EX OINT
TOPICAL_OINTMENT | CUTANEOUS | Status: DC | PRN
  Administered 2022-05-07: 1 via TOPICAL

## 2022-05-07 MED ORDER — PROPOFOL 10 MG/ML IV BOLUS
INTRAVENOUS | Status: DC | PRN
  Administered 2022-05-07: 20 mg via INTRAVENOUS

## 2022-05-07 MED ORDER — FENTANYL CITRATE (PF) 100 MCG/2ML IJ SOLN
INTRAMUSCULAR | Status: DC | PRN
Start: 2022-05-07 — End: 2022-05-07
  Administered 2022-05-07: 50 ug via INTRAVENOUS

## 2022-05-07 MED ORDER — ACETAMINOPHEN 500 MG PO TABS
1000.0000 mg | ORAL_TABLET | Freq: Once | ORAL | Status: AC
  Administered 2022-05-07: 1000 mg via ORAL

## 2022-05-07 MED ORDER — OXYCODONE-ACETAMINOPHEN 10-325 MG PO TABS: 0.5000 | ORAL_TABLET | Freq: Four times a day (QID) | ORAL | 0 refills | Status: AC | PRN

## 2022-05-07 SURGICAL SUPPLY — 32 items
BLADE SURG 15 STRL LF DISP TIS (BLADE) ×1 IMPLANT
BLADE SURG 15 STRL SS (BLADE) ×2
BNDG CMPR 9X4 STRL LF SNTH (GAUZE/BANDAGES/DRESSINGS) ×1
BNDG ELASTIC 4X5.8 VLCR STR LF (GAUZE/BANDAGES/DRESSINGS) ×2 IMPLANT
BNDG ESMARK 4X9 LF (GAUZE/BANDAGES/DRESSINGS) ×2 IMPLANT
COVER BACK TABLE 60X90IN (DRAPES) ×2 IMPLANT
CUFF TOURN SGL QUICK 18X4 (TOURNIQUET CUFF) ×2 IMPLANT
DRAPE EXTREMITY T 121X128X90 (DISPOSABLE) ×2 IMPLANT
DRSG ADAPTIC 3X8 NADH LF (GAUZE/BANDAGES/DRESSINGS) ×1 IMPLANT
DRSG EMULSION OIL 3X3 NADH (GAUZE/BANDAGES/DRESSINGS) ×2 IMPLANT
GAUZE 4X4 16PLY ~~LOC~~+RFID DBL (SPONGE) ×2 IMPLANT
GAUZE SPONGE 4X4 12PLY STRL (GAUZE/BANDAGES/DRESSINGS) ×2 IMPLANT
GAUZE SPONGE 4X4 8PLY NS (GAUZE/BANDAGES/DRESSINGS) ×1 IMPLANT
GLOVE BIOGEL PI IND STRL 7.5 (GLOVE) ×1 IMPLANT
GLOVE BIOGEL PI INDICATOR 7.5 (GLOVE) ×1
GOWN STRL REUS W/TWL LRG LVL3 (GOWN DISPOSABLE) ×2 IMPLANT
HIBICLENS CHG 4% 4OZ BTL (MISCELLANEOUS) ×2 IMPLANT
KIT TURNOVER CYSTO (KITS) ×2 IMPLANT
KNIFE CARPAL TUNNEL (BLADE) ×2 IMPLANT
NEEDLE HYPO 22GX1.5 SAFETY (NEEDLE) ×2 IMPLANT
NS IRRIG 500ML POUR BTL (IV SOLUTION) ×2 IMPLANT
PACK BASIN DAY SURGERY FS (CUSTOM PROCEDURE TRAY) ×2 IMPLANT
PAD CAST 4YDX4 CTTN HI CHSV (CAST SUPPLIES) ×1 IMPLANT
PADDING CAST ABS 6INX4YD NS (CAST SUPPLIES) ×1
PADDING CAST ABS COTTON 6X4 NS (CAST SUPPLIES) IMPLANT
PADDING CAST COTTON 4X4 STRL (CAST SUPPLIES) ×2
SUT ETHILON 4 0 PS 2 18 (SUTURE) ×2 IMPLANT
SYR 10ML LL (SYRINGE) ×2 IMPLANT
SYR BULB EAR ULCER 3OZ GRN STR (SYRINGE) ×2 IMPLANT
TOWEL OR 17X26 10 PK STRL BLUE (TOWEL DISPOSABLE) ×2 IMPLANT
TRAY DSU PREP LF (CUSTOM PROCEDURE TRAY) ×2 IMPLANT
UNDERPAD 30X36 HEAVY ABSORB (UNDERPADS AND DIAPERS) ×2 IMPLANT

## 2022-05-07 NOTE — Anesthesia Postprocedure Evaluation (Signed)
Anesthesia Post Note  Patient: Kristy Wright  Procedure(s) Performed: Right Carpal Tunnel Release (Right: Hand)     Patient location during evaluation: PACU Anesthesia Type: MAC Level of consciousness: awake and alert, patient cooperative and oriented Pain management: pain level controlled Vital Signs Assessment: post-procedure vital signs reviewed and stable Respiratory status: spontaneous breathing, nonlabored ventilation and respiratory function stable Cardiovascular status: stable and blood pressure returned to baseline Postop Assessment: no apparent nausea or vomiting, able to ambulate and adequate PO intake Anesthetic complications: no   No notable events documented.  Last Vitals:  Vitals:   05/07/22 0815 05/07/22 0830  BP: 116/77 121/80  Pulse: 66 (!) 51  Resp: (!) 26 15  Temp:  36.7 C  SpO2: 94% 95%    Last Pain:  Vitals:   05/07/22 0830  TempSrc:   PainSc: 0-No pain                 Eldra Word,E. Kaveri Perras

## 2022-05-07 NOTE — Transfer of Care (Signed)
Immediate Anesthesia Transfer of Care Note  Patient: Kristy Wright  Procedure(s) Performed: Right Carpal Tunnel Release (Right: Hand)  Patient Location: PACU  Anesthesia Type:MAC  Level of Consciousness: awake, alert , oriented and patient cooperative  Airway & Oxygen Therapy: Patient Spontanous Breathing  Post-op Assessment: Report given to RN and Post -op Vital signs reviewed and stable  Post vital signs: Reviewed and stable  Last Vitals:  Vitals Value Taken Time  BP 122/75 05/07/22 0753  Temp    Pulse 77 05/07/22 0754  Resp 17 05/07/22 0754  SpO2 98 % 05/07/22 0754  Vitals shown include unvalidated device data.  Last Pain:  Vitals:   05/07/22 0610  TempSrc: Oral  PainSc: 0-No pain      Patients Stated Pain Goal: 5 (37/85/88 5027)  Complications: No notable events documented.

## 2022-05-07 NOTE — Interval H&P Note (Signed)
History and Physical Interval Note:  05/07/2022 7:27 AM  Kristy Wright  has presented today for surgery, with the diagnosis of Right Carpal Tunnel Syndrome.  The various methods of treatment have been discussed with the patient and family. After consideration of risks, benefits and other options for treatment, the patient has consented to  Procedure(s) with comments: Right Carpal Tunnel Release (Right) - with local anesthesia and MAC as a surgical intervention.  The patient's history has been reviewed, patient examined, no change in status, stable for surgery.  I have reviewed the patient's chart and labs.  Questions were answered to the patient's satisfaction.     Orene Desanctis

## 2022-05-07 NOTE — Op Note (Signed)
OPERATIVE NOTE  DATE OF PROCEDURE: 05/07/2022  SURGEON: Izell [place], MD  PREOPERATIVE DIAGNOSIS: Right Carpal Tunnel Syndrome  POSTOPERATIVE DIAGNOSIS: Same  NAME OF PROCEDURE: Right Carpal Tunnel Release  ANESTHESIA: Local + MAC  SKIN PREPARATION: Hibiclens  ESTIMATED BLOOD LOSS: Minimal  IMPLANTS: none  INDICATIONS:  Kristy Wright is a 31 y.o. female who presents with right carpal tunnel syndrome, refractory to nonoperative treatment. The patient has decided to proceed with surgical intervention.  Risks, benefits and alternatives of operative management were discussed including, but not limited to, risks of anesthesia complications, infection, pain, persistent symptoms, stiffness, need for future surgery.  The patient understands, agrees and elects to proceed with surgery.    DESCRIPTION OF PROCEDURE: The patient was placed in the usual supine position and the right upper extremity was prepped and draped in normal sterile fashion.  After local block anesthetic to the right hand and wrist, a standard 1.5 cm incision was made in the midpalm.  This was carried down through the subcutaneous tissues and palmar fascia to the transverse carpal ligament.  The distal one-half of the transverse carpal ligament was incised longitudinally under direct vision using a 15 blade.  The carpal tunnel release guide was then placed under direct vision on the transverse carpal ligament and slid proximally.  The guide was palpated into appropriate alignment longitudinally.  Contact with the transverse carpal ligament was maintained throughout passing.  The blade was engaged into the guide and the remaining portion of the transverse carpal ligament released completely.  No other abnormalities were noted.  The wound was copiously irrigated and the skin closed using horizontal mattress 4-0 nylon sutures.  A light bulky dressing was placed.  The patient tolerated the procedure well and returned to the recovery room in  stable condition.  I was present for the entire surgical procedure.   Matt Holmes, MD

## 2022-05-07 NOTE — Discharge Instructions (Addendum)
Orthopaedic Hand Surgery Discharge Instructions  WEIGHT BEARING STATUS: Non weight bearing on operative extremity  INCISION CARE: Keep dressing over your incision clean and dry until 5 days after surgery. You may shower by placing a waterproof covering over your dressing. Once dressing is removed, you may allow water to run over the incision and then place Band-Aids over incision. Do not scrub your incision or apply creams/lotions. Do not submerge your incision or swim for 3 weeks after surgery. Contact your surgeon or primary care doctor if you develop redness or drainage from your incision.   PAIN CONTROL: First line medications for post operative pain control are Tylenol (acetaminophen) and Motrin (ibuprofen) if you are able to take these medications. If you have been prescribed a medication these can be taken as breakthrough pain medications. Please note that some narcotic pain medication has acetaminophen added and you should never consume more than 4,000mg  of acetaminophen in 24-hour period. Please note that if you are given Toradol (ketorolac) you should not take similar medications such as ibuprofen or naproxen.  DISCHARGE MEDICATIONS: If you have been prescribed medication it was sent electronically to your pharmacy. No changes have been made to your home medications.  ICE/ELEVATION: Ice and elevate your injured extremity as needed. Avoid direct contact of ice with skin.   BANDAGE FEELS TOO TIGHT: If your bandage feels too tight, first make sure you are elevating your fingers as much as possible. The outer layer of the bandage can be unwrapped and reapplied more loosely. If no improvement, you may carefully cut the inner layer longitudinally until the pressure has resolved and then rewrap the outer layer. If you are not comfortable with these instructions, please call the office and the bandage can be changed for you.   FOLLOW UP: You will be called after surgery with an appointment date and  time, however if you have not received a phone call within 3 days, please call during regular office hours at [phone] to schedule a post operative appointment.  Please Seek Medical Attention if: Call MD for: pain or pressure in chest, jaw, arm, back, neck  Call MD for: temperature greater than 101 F for more than 24 hrs Call MD for: difficulty breathing Call MD for: incision redness, bleeding, drainage  Call MD for: palpitations or feeling that the heart is racing  Call MD for: increased swelling in arm, leg, ankle, or abdomen  Call MD for: lightheadedness, dizziness, fainting Call 911 or go to ER for any medical emergency if you are not able to get in touch with your doctor   J. Standley Dakins, MD Orthopaedic Hand Surgeon [facility] Office number: [phone] [address], [address] [place], [place] [address]      NO TYLENOL PRODUCTS UNTIL 12:15 PM TODAY.     Post Anesthesia Home Care Instructions  Activity: Get plenty of rest for the remainder of the day. A responsible individual must stay with you for 24 hours following the procedure.  For the next 24 hours, DO NOT: -Drive a car -Advertising copywriter -Drink alcoholic beverages -Take any medication unless instructed by your physician -Make any legal decisions or sign important papers.  Meals: Start with liquid foods such as gelatin or soup. Progress to regular foods as tolerated. Avoid greasy, spicy, heavy foods. If nausea and/or vomiting occur, drink only clear liquids until the nausea and/or vomiting subsides. Call your physician if vomiting continues.  Special Instructions/Symptoms: Your throat may feel dry or sore from the anesthesia or the breathing  tube placed in your throat during surgery. If this causes discomfort, gargle with warm salt water. The discomfort should disappear within 24 hours.

## 2022-05-08 ENCOUNTER — Encounter (HOSPITAL_BASED_OUTPATIENT_CLINIC_OR_DEPARTMENT_OTHER): Payer: Self-pay | Admitting: Orthopedic Surgery

## 2022-05-20 ENCOUNTER — Other Ambulatory Visit: Payer: Self-pay | Admitting: Internal Medicine

## 2022-05-20 DIAGNOSIS — G5601 Carpal tunnel syndrome, right upper limb: Secondary | ICD-10-CM | POA: Diagnosis not present

## 2022-06-11 ENCOUNTER — Other Ambulatory Visit: Payer: Self-pay | Admitting: Internal Medicine

## 2022-06-11 ENCOUNTER — Other Ambulatory Visit: Payer: Self-pay | Admitting: Endocrinology

## 2022-06-11 MED ORDER — LEVOTHYROXINE SODIUM 200 MCG PO TABS: 200.0000 ug | ORAL_TABLET | Freq: Every day | ORAL | 0 refills | Status: DC

## 2022-07-01 ENCOUNTER — Ambulatory Visit: Payer: BC Managed Care – PPO | Admitting: Nurse Practitioner

## 2022-07-02 ENCOUNTER — Ambulatory Visit: Payer: BC Managed Care – PPO | Admitting: Nurse Practitioner

## 2022-07-02 VITALS — BP 104/62 | HR 70 | Temp 96.9°F | Resp 12 | Ht 62.0 in | Wt 239.5 lb

## 2022-07-02 DIAGNOSIS — E039 Hypothyroidism, unspecified: Secondary | ICD-10-CM | POA: Diagnosis not present

## 2022-07-02 DIAGNOSIS — N926 Irregular menstruation, unspecified: Secondary | ICD-10-CM | POA: Insufficient documentation

## 2022-07-02 LAB — POCT URINE PREGNANCY: Preg Test, Ur: NEGATIVE

## 2022-07-02 LAB — HCG, QUANTITATIVE, PREGNANCY: Quantitative HCG: <0.6 m[IU]/mL

## 2022-07-02 LAB — TSH: TSH: 9.73 u[IU]/mL — ABNORMAL HIGH (ref 0.35–5.50)

## 2022-07-02 LAB — T4, FREE: Free T4: 0.86 ng/dL (ref 0.60–1.60)

## 2022-07-02 NOTE — Assessment & Plan Note (Signed)
Patient missed menses.  Generally a very regular person with normal flows.  Has taken several pregnancy tests at home that were negative urine pregnancy test in office negative today.  Pending serum hCG quant.  Is having unprotected sex with her husband.  Pending lab results continue watchful waiting to see if menstrual cycle appears this month.  If not in a recurrent issue will refer to GYN

## 2022-07-02 NOTE — Patient Instructions (Signed)
Nice to see you today I will be in touch with the labs Follow up if no improvement. We can always get you established with GYN if needed

## 2022-07-02 NOTE — Assessment & Plan Note (Signed)
Was followed by endocrinology.  Has gotten an appoint with endocrine but will not be until later this year.  Last TSH was within normal limits we will recheck today given missed menses.  Pending result patient states she been taking medication as prescribed and as recommended

## 2022-07-02 NOTE — Progress Notes (Signed)
Established Patient Office Visit  Subjective   Patient ID: Kristy Wright, female    DOB: May 16, 1991  Age: 31 y.o. MRN: [id number]  Chief Complaint  Patient presents with   Amenorrhea    Last period on 05/13/22. Feels bloated but no urinary issues or abdominal pain. Had sexual encounter on 6/25 or 05/20/22, also in July and on 07/01/22. Usually has a period every month. Has taking several pregnancy tests at home and they have been negative.    HPI  Missed period: States that her last period started on 05/13/2022. States they generally 4-5 days. Normal flow. States she has had unprotected sex with her spouse. States that she has stopped her OCP shortly after our last office visit together     Review of Systems  Constitutional:  Positive for malaise/fatigue. Negative for chills and fever.  Respiratory:  Negative for shortness of breath.   Cardiovascular:  Negative for chest pain.  Gastrointestinal:  Negative for abdominal pain, diarrhea, nausea and vomiting.       Feels bloated  Last BM today   Neurological:  Negative for dizziness.      Objective:     BP 104/62   Pulse 70   Temp (!) 96.9 F (36.1 C)   Resp 12   Ht 5\' 2"  (1.575 m)   Wt 239 lb 8 oz (108.6 kg)   LMP 05/13/2022   SpO2 97%   BMI 43.81 kg/m  BP Readings from Last 3 Encounters:  07/02/22 104/62  05/07/22 121/80  04/02/22 114/66   Wt Readings from Last 3 Encounters:  07/02/22 239 lb 8 oz (108.6 kg)  05/07/22 242 lb 12.8 oz (110.1 kg)  04/02/22 237 lb (107.5 kg)      Physical Exam Vitals and nursing note reviewed.  Constitutional:      Appearance: Normal appearance. She is obese.  Cardiovascular:     Rate and Rhythm: Normal rate and regular rhythm.     Heart sounds: Normal heart sounds.  Pulmonary:     Breath sounds: Normal breath sounds.  Abdominal:     General: Bowel sounds are normal. There is no distension.     Palpations: There is no mass.     Tenderness: There is no abdominal  tenderness.     Hernia: No hernia is present.  Lymphadenopathy:     Cervical: No cervical adenopathy.  Neurological:     Mental Status: She is alert.      Results for orders placed or performed in visit on 07/02/22  POCT urine pregnancy  Result Value Ref Range   Preg Test, Ur Negative Negative      The ASCVD Risk score (Arnett DK, et al., 2019) failed to calculate for the following reasons:   The 2019 ASCVD risk score is only valid for ages 64 to 33    Assessment & Plan:   Problem List Items Addressed This Visit       Endocrine   Hypothyroidism    Was followed by endocrinology.  Has gotten an appoint with endocrine but will not be until later this year.  Last TSH was within normal limits we will recheck today given missed menses.  Pending result patient states she been taking medication as prescribed and as recommended      Relevant Orders   TSH   T4, free     Other   Missed menses - Primary    Patient missed menses.  Generally a very regular person with normal flows.  Has taken several pregnancy tests at home that were negative urine pregnancy test in office negative today.  Pending serum hCG quant.  Is having unprotected sex with her husband.  Pending lab results continue watchful waiting to see if menstrual cycle appears this month.  If not in a recurrent issue will refer to GYN      Relevant Orders   POCT urine pregnancy (Completed)   hCG, quantitative, pregnancy   TSH    Return if symptoms worsen or fail to improve.    Audria Nine, NP

## 2022-07-03 ENCOUNTER — Other Ambulatory Visit (INDEPENDENT_AMBULATORY_CARE_PROVIDER_SITE_OTHER): Payer: BC Managed Care – PPO

## 2022-07-03 ENCOUNTER — Encounter: Payer: Self-pay | Admitting: Nurse Practitioner

## 2022-07-03 DIAGNOSIS — E039 Hypothyroidism, unspecified: Secondary | ICD-10-CM | POA: Diagnosis not present

## 2022-07-03 LAB — T3, FREE: T3, Free: 2.8 pg/mL (ref 2.3–4.2)

## 2022-07-04 ENCOUNTER — Other Ambulatory Visit: Payer: Self-pay | Admitting: Internal Medicine

## 2022-07-05 ENCOUNTER — Encounter (HOSPITAL_BASED_OUTPATIENT_CLINIC_OR_DEPARTMENT_OTHER): Payer: Self-pay | Admitting: Orthopedic Surgery

## 2022-07-08 ENCOUNTER — Encounter (HOSPITAL_BASED_OUTPATIENT_CLINIC_OR_DEPARTMENT_OTHER): Payer: Self-pay | Admitting: Orthopedic Surgery

## 2022-07-08 NOTE — Progress Notes (Signed)
Spoke w/ via phone for pre-op interview--- pt Lab needs dos----  urine preg             Lab results------ no COVID test -----patient states asymptomatic no test needed Arrive at ------- 0530 on 07-16-2022 NPO after MN NO Solid Food.  Clear liquids from MN until--- 0430 Med rec completed Medications to take morning of surgery ----- synthroid Diabetic medication ----- n/a Patient instructed no nail polish to be worn day of surgery Patient instructed to bring photo id and insurance card day of surgery Patient aware to have Driver (ride ) / caregiver  for 24 hours after surgery --husband, Kristy Wright Patient Special Instructions ----- n/a Pre-Op special Istructions ----- pre-op orders pending Patient verbalized understanding of instructions that were given at this phone interview. Patient denies shortness of breath, chest pain, fever, cough at this phone interview.

## 2022-07-13 NOTE — H&P (Signed)
Preoperative History & Physical Exam  Surgeon: Philipp Ovens, MD  Diagnosis: Left Carpal Tunnel Syndrome  Planned Procedure: Procedure(s) (LRB): CARPAL TUNNEL RELEASE (Left)  History of Present Illness:   Patient is a 31 y.o. female with symptoms consistent with Left Carpal Tunnel Syndrome who presents for surgical intervention. The risks, benefits and alternatives of surgical intervention were discussed and informed consent was obtained prior to surgery.  Past Medical History:  Past Medical History:  Diagnosis Date   Carpal tunnel syndrome, bilateral    s/p release 05-01-2022   GERD (gastroesophageal reflux disease)    History of acute pyelonephritis    ED visit in epic 04-02-2022   Hypothyroidism    followed by pcp   (last ultrasound in epic 11-11-2018 no nodules)    Past Surgical History:  Past Surgical History:  Procedure Laterality Date   CARPAL TUNNEL RELEASE Right 05/07/2022   Procedure: Right Carpal Tunnel Release;  Surgeon: Gomez Cleverly, MD;  Location: [facility];  Service: Orthopedics;  Laterality: Right;  with local anesthesia    Medications:  Prior to Admission medications   Medication Sig Start Date End Date Taking? Authorizing Provider  levothyroxine (SYNTHROID) 200 MCG tablet TAKE 1 TABLET (200 MCG TOTAL) BY MOUTH DAILY BEFORE BREAKFAST. Patient taking differently: Take 200 mcg by mouth daily before breakfast. 07/04/22  Yes Shamleffer, Konrad Dolores, MD    Allergies:  Patient has no known allergies.  Review of Systems: Negative except per HPI.  Physical Exam: Alert and oriented, NAD Head and neck: no masses, normal alignment CV: pulse intact Pulm: no increased work of breathing, respirations even and unlabored Abdomen: non-distended Extremities: extremities warm and well perfused  LABS: Recent Results (from the past 2160 hour(s))  Pregnancy, urine POC     Status: None   Collection Time: 05/07/22  5:57 AM  Result Value Ref  Range   Preg Test, Ur NEGATIVE NEGATIVE    Comment:        THE SENSITIVITY OF THIS METHODOLOGY IS >24 mIU/mL   POCT urine pregnancy     Status: Normal   Collection Time: 07/02/22  9:10 AM  Result Value Ref Range   Preg Test, Ur Negative Negative  hCG, quantitative, pregnancy     Status: None   Collection Time: 07/02/22  9:19 AM  Result Value Ref Range   Quantitative HCG <0.60 mIU/ml    Comment: Non-Pregnant Females >60yr and <34yr=0.0-0.6(mIU/ml)Non-Pregnant Females >76yrs=0.0-3.1(mIU/ml)Non-Pregnant Females Post-Menopause0.1-11.6(mIU/ml)  TSH     Status: Abnormal   Collection Time: 07/02/22  9:19 AM  Result Value Ref Range   TSH 9.73 (H) 0.35 - 5.50 uIU/mL  T4, free     Status: None   Collection Time: 07/02/22  9:19 AM  Result Value Ref Range   Free T4 0.86 0.60 - 1.60 ng/dL    Comment: Specimens from patients who are undergoing biotin therapy and /or ingesting biotin supplements may contain high levels of biotin.  The higher biotin concentration in these specimens interferes with this Free T4 assay.  Specimens that contain high levels  of biotin may cause false high results for this Free T4 assay.  Please interpret results in light of the total clinical presentation of the patient.    T3, free     Status: None   Collection Time: 07/03/22  9:59 AM  Result Value Ref Range   T3, Free 2.8 2.3 - 4.2 pg/mL     Complete History and Physical exam available in the office notes  Kristy Wright

## 2022-07-16 ENCOUNTER — Encounter (HOSPITAL_BASED_OUTPATIENT_CLINIC_OR_DEPARTMENT_OTHER)
Admission: RE | Disposition: A | Discharge status: Home / Self Care | Payer: Self-pay | Source: Ambulatory Visit | Attending: Orthopedic Surgery

## 2022-07-16 ENCOUNTER — Ambulatory Visit (HOSPITAL_BASED_OUTPATIENT_CLINIC_OR_DEPARTMENT_OTHER): Payer: BC Managed Care – PPO | Admitting: Anesthesiology

## 2022-07-16 ENCOUNTER — Ambulatory Visit (HOSPITAL_BASED_OUTPATIENT_CLINIC_OR_DEPARTMENT_OTHER)
Admission: RE | Admit: 2022-07-16 | Discharge: 2022-07-16 | Disposition: A | Discharge status: Home / Self Care | Payer: BC Managed Care – PPO | Source: Ambulatory Visit | Attending: Orthopedic Surgery | Admitting: Orthopedic Surgery

## 2022-07-16 ENCOUNTER — Other Ambulatory Visit: Payer: Self-pay

## 2022-07-16 ENCOUNTER — Encounter (HOSPITAL_BASED_OUTPATIENT_CLINIC_OR_DEPARTMENT_OTHER): Payer: Self-pay | Admitting: Orthopedic Surgery

## 2022-07-16 DIAGNOSIS — Z6841 Body Mass Index (BMI) 40.0 and over, adult: Secondary | ICD-10-CM | POA: Diagnosis not present

## 2022-07-16 DIAGNOSIS — G5602 Carpal tunnel syndrome, left upper limb: Secondary | ICD-10-CM | POA: Diagnosis not present

## 2022-07-16 DIAGNOSIS — Z79890 Hormone replacement therapy (postmenopausal): Secondary | ICD-10-CM | POA: Diagnosis not present

## 2022-07-16 DIAGNOSIS — Z01818 Encounter for other preprocedural examination: Secondary | ICD-10-CM

## 2022-07-16 DIAGNOSIS — E039 Hypothyroidism, unspecified: Secondary | ICD-10-CM | POA: Insufficient documentation

## 2022-07-16 DIAGNOSIS — K219 Gastro-esophageal reflux disease without esophagitis: Secondary | ICD-10-CM | POA: Insufficient documentation

## 2022-07-16 HISTORY — PX: CARPAL TUNNEL RELEASE: SHX101

## 2022-07-16 HISTORY — DX: Hypothyroidism, unspecified: E03.9

## 2022-07-16 HISTORY — DX: Personal history of other diseases of urinary system: Z87.448

## 2022-07-16 HISTORY — DX: Carpal tunnel syndrome, bilateral upper limbs: G56.03

## 2022-07-16 LAB — POCT PREGNANCY, URINE: Preg Test, Ur: NEGATIVE

## 2022-07-16 SURGERY — CARPAL TUNNEL RELEASE
Anesthesia: Monitor Anesthesia Care | Site: Wrist | Laterality: Left

## 2022-07-16 MED ORDER — ONDANSETRON HCL 4 MG/2ML IJ SOLN: 4.0000 mg | Freq: Once | INTRAMUSCULAR | Status: DC | PRN

## 2022-07-16 MED ORDER — OXYCODONE-ACETAMINOPHEN 5-325 MG PO TABS: 1.0000 | ORAL_TABLET | Freq: Four times a day (QID) | ORAL | 0 refills | Status: AC | PRN

## 2022-07-16 MED ORDER — OXYCODONE HCL 5 MG/5ML PO SOLN: 5.0000 mg | Freq: Once | ORAL | Status: DC | PRN

## 2022-07-16 MED ORDER — MIDAZOLAM HCL 2 MG/2ML IJ SOLN
INTRAMUSCULAR | Status: AC
  Filled 2022-07-16: qty 2

## 2022-07-16 MED ORDER — PROPOFOL 500 MG/50ML IV EMUL
INTRAVENOUS | Status: AC
  Filled 2022-07-16: qty 100

## 2022-07-16 MED ORDER — ACETAMINOPHEN 500 MG PO TABS
ORAL_TABLET | ORAL | Status: AC
  Filled 2022-07-16: qty 2

## 2022-07-16 MED ORDER — PROPOFOL 500 MG/50ML IV EMUL
INTRAVENOUS | Status: DC | PRN
  Administered 2022-07-16: 75 ug/kg/min via INTRAVENOUS

## 2022-07-16 MED ORDER — MIDAZOLAM HCL 2 MG/2ML IJ SOLN
INTRAMUSCULAR | Status: DC | PRN
  Administered 2022-07-16: 1 mg via INTRAVENOUS

## 2022-07-16 MED ORDER — ACETAMINOPHEN 500 MG PO TABS
1000.0000 mg | ORAL_TABLET | Freq: Once | ORAL | Status: AC
  Administered 2022-07-16: 1000 mg via ORAL

## 2022-07-16 MED ORDER — OXYCODONE HCL 5 MG PO TABS: 5.0000 mg | ORAL_TABLET | Freq: Once | ORAL | Status: DC | PRN

## 2022-07-16 MED ORDER — LACTATED RINGERS IV SOLN: INTRAVENOUS | Status: DC

## 2022-07-16 MED ORDER — LIDOCAINE HCL (PF) 2 % IJ SOLN
INTRAMUSCULAR | Status: AC
  Filled 2022-07-16: qty 5

## 2022-07-16 MED ORDER — KETOROLAC TROMETHAMINE 30 MG/ML IJ SOLN: 30.0000 mg | Freq: Once | INTRAMUSCULAR | Status: DC | PRN

## 2022-07-16 MED ORDER — KETAMINE HCL 50 MG/5ML IJ SOSY
PREFILLED_SYRINGE | INTRAMUSCULAR | Status: AC
  Filled 2022-07-16: qty 5

## 2022-07-16 MED ORDER — FENTANYL CITRATE (PF) 100 MCG/2ML IJ SOLN
INTRAMUSCULAR | Status: DC | PRN
  Administered 2022-07-16: 50 ug via INTRAVENOUS

## 2022-07-16 MED ORDER — FENTANYL CITRATE (PF) 100 MCG/2ML IJ SOLN: 25.0000 ug | INTRAMUSCULAR | Status: DC | PRN

## 2022-07-16 MED ORDER — PROPOFOL 10 MG/ML IV BOLUS
INTRAVENOUS | Status: DC | PRN
  Administered 2022-07-16: 30 mg via INTRAVENOUS

## 2022-07-16 MED ORDER — AMISULPRIDE (ANTIEMETIC) 5 MG/2ML IV SOLN
10.0000 mg | Freq: Once | INTRAVENOUS | Status: DC | PRN
Start: 2022-07-16 — End: 2022-07-16

## 2022-07-16 MED ORDER — LIDOCAINE HCL (PF) 1 % IJ SOLN
INTRAMUSCULAR | Status: DC | PRN
  Administered 2022-07-16: 5 mL

## 2022-07-16 MED ORDER — 0.9 % SODIUM CHLORIDE (POUR BTL) OPTIME
TOPICAL | Status: DC | PRN
  Administered 2022-07-16: 500 mL

## 2022-07-16 MED ORDER — BACITRACIN ZINC 500 UNIT/GM EX OINT
TOPICAL_OINTMENT | CUTANEOUS | Status: DC | PRN
  Administered 2022-07-16: 1 via TOPICAL

## 2022-07-16 MED ORDER — BUPIVACAINE HCL (PF) 0.5 % IJ SOLN
INTRAMUSCULAR | Status: DC | PRN
  Administered 2022-07-16: 5 mL

## 2022-07-16 MED ORDER — FENTANYL CITRATE (PF) 100 MCG/2ML IJ SOLN
INTRAMUSCULAR | Status: AC
  Filled 2022-07-16: qty 2

## 2022-07-16 SURGICAL SUPPLY — 30 items
BANDAGE ACE 4X5 VEL STRL LF (GAUZE/BANDAGES/DRESSINGS) IMPLANT
BLADE SURG 15 STRL LF DISP TIS (BLADE) ×1 IMPLANT
BLADE SURG 15 STRL SS (BLADE) ×1
BNDG CMPR 9X4 STRL LF SNTH (GAUZE/BANDAGES/DRESSINGS) ×1
BNDG ELASTIC 4X5.8 VLCR STR LF (GAUZE/BANDAGES/DRESSINGS) ×1 IMPLANT
BNDG ESMARK 4X9 LF (GAUZE/BANDAGES/DRESSINGS) ×1 IMPLANT
COVER BACK TABLE 60X90IN (DRAPES) ×1 IMPLANT
CUFF TOURN SGL QUICK 18X4 (TOURNIQUET CUFF) ×1 IMPLANT
DRAPE EXTREMITY T 121X128X90 (DISPOSABLE) ×1 IMPLANT
DRSG EMULSION OIL 3X3 NADH (GAUZE/BANDAGES/DRESSINGS) ×1 IMPLANT
GAUZE 4X4 16PLY ~~LOC~~+RFID DBL (SPONGE) ×1 IMPLANT
GAUZE SPONGE 4X4 12PLY STRL (GAUZE/BANDAGES/DRESSINGS) ×1 IMPLANT
GLOVE BIOGEL PI IND STRL 7.5 (GLOVE) ×1 IMPLANT
GLOVE BIOGEL PI INDICATOR 7.5 (GLOVE) ×1
GOWN STRL REUS W/TWL LRG LVL3 (GOWN DISPOSABLE) ×1 IMPLANT
HIBICLENS CHG 4% 4OZ BTL (MISCELLANEOUS) ×1 IMPLANT
KIT TURNOVER CYSTO (KITS) ×1 IMPLANT
KNIFE CARPAL TUNNEL (BLADE) ×1 IMPLANT
NEEDLE HYPO 22GX1.5 SAFETY (NEEDLE) ×1 IMPLANT
NS IRRIG 500ML POUR BTL (IV SOLUTION) ×1 IMPLANT
PACK BASIN DAY SURGERY FS (CUSTOM PROCEDURE TRAY) ×1 IMPLANT
PAD CAST 4YDX4 CTTN HI CHSV (CAST SUPPLIES) ×1 IMPLANT
PADDING CAST COTTON 4X4 STRL (CAST SUPPLIES) ×1
SPONGE GAUZE 4X4 12PLY STER LF (GAUZE/BANDAGES/DRESSINGS) IMPLANT
SUT ETHILON 4 0 PS 2 18 (SUTURE) ×1 IMPLANT
SYR 10ML LL (SYRINGE) ×1 IMPLANT
SYR BULB EAR ULCER 3OZ GRN STR (SYRINGE) ×1 IMPLANT
TOWEL OR 17X26 10 PK STRL BLUE (TOWEL DISPOSABLE) ×1 IMPLANT
TRAY DSU PREP LF (CUSTOM PROCEDURE TRAY) ×1 IMPLANT
UNDERPAD 30X36 HEAVY ABSORB (UNDERPADS AND DIAPERS) ×1 IMPLANT

## 2022-07-16 NOTE — Anesthesia Postprocedure Evaluation (Signed)
Anesthesia Post Note  Patient: Kristy Wright  Procedure(s) Performed: CARPAL TUNNEL RELEASE (Left: Wrist)     Patient location during evaluation: PACU Anesthesia Type: MAC Level of consciousness: awake and alert Pain management: pain level controlled Vital Signs Assessment: post-procedure vital signs reviewed and stable Respiratory status: spontaneous breathing, nonlabored ventilation and respiratory function stable Cardiovascular status: blood pressure returned to baseline and stable Postop Assessment: no apparent nausea or vomiting Anesthetic complications: no   No notable events documented.  Last Vitals:  Vitals:   07/16/22 0810 07/16/22 0815  BP: 112/69 106/76  Pulse: 64 (!) 57  Resp: 12   Temp: (!) 36.2 C   SpO2: 97% 97%    Last Pain:  Vitals:   07/16/22 0810  TempSrc:   PainSc: 0-No pain                 Pervis Hocking

## 2022-07-16 NOTE — Op Note (Signed)
OPERATIVE NOTE  DATE OF PROCEDURE: 07/16/2022  SURGEON: Izell Frank, MD  PREOPERATIVE DIAGNOSIS: Left Carpal Tunnel Syndrome  POSTOPERATIVE DIAGNOSIS: Same  NAME OF PROCEDURE: Left Carpal Tunnel Release  ANESTHESIA: Local + MAC  SKIN PREPARATION: Hibiclens  ESTIMATED BLOOD LOSS: Minimal  IMPLANTS: none  INDICATIONS:  Kristy Wright is a 31 y.o. female who presents with left carpal tunnel syndrome, refractory to nonoperative treatment. The patient has decided to proceed with surgical intervention.  Risks, benefits and alternatives of operative management were discussed including, but not limited to, risks of anesthesia complications, infection, pain, persistent symptoms, stiffness, need for future surgery.  The patient understands, agrees and elects to proceed with surgery.    DESCRIPTION OF PROCEDURE: The patient was placed in the usual supine position and the left upper extremity was prepped and draped in normal sterile fashion.  After local block anesthetic to the left hand and wrist, a standard 1.5 cm incision was made in the midpalm.  This was carried down through the subcutaneous tissues and palmar fascia to the transverse carpal ligament.  The distal one-half of the transverse carpal ligament was incised longitudinally under direct vision using a 15 blade.  The carpal tunnel release guide was then placed under direct vision on the transverse carpal ligament and slid proximally.  The guide was palpated into appropriate alignment longitudinally.  Contact with the transverse carpal ligament was maintained throughout passing.  The blade was engaged into the guide and the remaining portion of the transverse carpal ligament released completely.  No other abnormalities were noted.  The wound was copiously irrigated and the skin closed using horizontal mattress 4-0 nylon sutures.  A light bulky dressing was placed.  The patient tolerated the procedure well and returned to the recovery room in stable  condition.  I was present for the entire surgical procedure.   Matt Holmes, MD

## 2022-07-16 NOTE — Transfer of Care (Signed)
Immediate Anesthesia Transfer of Care Note  Patient: Kristy Wright  Procedure(s) Performed: Procedure(s) (LRB): CARPAL TUNNEL RELEASE (Left)  Patient Location: Phase 2  Anesthesia Type: MAC  Level of Consciousness: awake, alert , oriented and patient cooperative  Airway & Oxygen Therapy: Patient Spontanous Breathing and Patient connected to face mask oxygen  Post-op Assessment: Report given to Phase 2 RN   Post vital signs: Reviewed and stable  Complications: No apparent anesthesia complications Last Vitals:  Vitals Value Taken Time  BP 112/69 07/16/22 0810  Temp 36.2 C 07/16/22 0810  Pulse 64 07/16/22 0810  Resp 12 07/16/22 0810  SpO2 97 % 07/16/22 0810    Last Pain:  Vitals:   07/16/22 0810  TempSrc:   PainSc: 0-No pain      Patients Stated Pain Goal: 6 (06/26/22 3612)  Complications: No notable events documented.

## 2022-07-16 NOTE — Anesthesia Procedure Notes (Signed)
Procedure Name: MAC Date/Time: 07/16/2022 7:41 AM  Performed by: Suan Halter, CRNAPre-anesthesia Checklist: Patient identified, Emergency Drugs available, Suction available and Patient being monitored Patient Re-evaluated:Patient Re-evaluated prior to induction Oxygen Delivery Method: Simple face mask Number of attempts: 1 Airway Equipment and Method: Bite block Placement Confirmation: positive ETCO2 Dental Injury: Teeth and Oropharynx as per pre-operative assessment

## 2022-07-16 NOTE — Interval H&P Note (Signed)
History and Physical Interval Note:  07/16/2022 7:36 AM  Kristy Wright  has presented today for surgery, with the diagnosis of Left Carpal Tunnel Syndrome.  The various methods of treatment have been discussed with the patient and family. After consideration of risks, benefits and other options for treatment, the patient has consented to  Procedure(s) with comments: CARPAL TUNNEL RELEASE (Left) - with local anesthesia as a surgical intervention.  The patient's history has been reviewed, patient examined, no change in status, stable for surgery.  I have reviewed the patient's chart and labs.  Questions were answered to the patient's satisfaction.     Gomez Cleverly

## 2022-07-16 NOTE — Anesthesia Preprocedure Evaluation (Signed)
Anesthesia Evaluation  Patient identified by MRN, date of birth, ID band Patient awake    Reviewed: Allergy & Precautions, NPO status , Patient's Chart, lab work & pertinent test results  Airway Mallampati: II  TM Distance: >3 FB Neck ROM: Full    Dental no notable dental hx.    Pulmonary neg pulmonary ROS,    Pulmonary exam normal breath sounds clear to auscultation       Cardiovascular negative cardio ROS Normal cardiovascular exam Rhythm:Regular Rate:Normal     Neuro/Psych negative neurological ROS  negative psych ROS   GI/Hepatic Neg liver ROS, GERD  Controlled,  Endo/Other  Hypothyroidism Morbid obesityBMI 45  Renal/GU negative Renal ROS  negative genitourinary   Musculoskeletal negative musculoskeletal ROS (+)   Abdominal (+) + obese,   Peds negative pediatric ROS (+)  Hematology negative hematology ROS (+)   Anesthesia Other Findings   Reproductive/Obstetrics Urine preg neg today                              Anesthesia Physical Anesthesia Plan  ASA: 3  Anesthesia Plan: MAC   Post-op Pain Management:    Induction:   PONV Risk Score and Plan: 2 and Propofol infusion and TIVA  Airway Management Planned: Natural Airway and Simple Face Mask  Additional Equipment: None  Intra-op Plan:   Post-operative Plan:   Informed Consent: I have reviewed the patients History and Physical, chart, labs and discussed the procedure including the risks, benefits and alternatives for the proposed anesthesia with the patient or authorized representative who has indicated his/her understanding and acceptance.       Plan Discussed with: CRNA  Anesthesia Plan Comments: (Local by surgeon)        Anesthesia Quick Evaluation

## 2022-07-16 NOTE — Discharge Instructions (Addendum)
Orthopaedic Hand Surgery Discharge Instructions  WEIGHT BEARING STATUS: Non weight bearing on operative extremity  INCISION CARE: Keep dressing over your incision clean and dry until 5 days after surgery. You may shower by placing a waterproof covering over your dressing. Once dressing is removed, you may allow water to run over the incision and then place Band-Aids over incision. Do not scrub your incision or apply creams/lotions. Do not submerge your incision or swim for 3 weeks after surgery. Contact your surgeon or primary care doctor if you develop redness or drainage from your incision.   PAIN CONTROL: First line medications for post operative pain control are Tylenol (acetaminophen) and Motrin (ibuprofen) if you are able to take these medications. If you have been prescribed a medication these can be taken as breakthrough pain medications. Please note that some narcotic pain medication has acetaminophen added and you should never consume more than 4,000mg of acetaminophen in 24-hour period. Please note that if you are given Toradol (ketorolac) you should not take similar medications such as ibuprofen or naproxen.  DISCHARGE MEDICATIONS: If you have been prescribed medication it was sent electronically to your pharmacy. No changes have been made to your home medications.  ICE/ELEVATION: Ice and elevate your injured extremity as needed. Avoid direct contact of ice with skin.   BANDAGE FEELS TOO TIGHT: If your bandage feels too tight, first make sure you are elevating your fingers as much as possible. The outer layer of the bandage can be unwrapped and reapplied more loosely. If no improvement, you may carefully cut the inner layer longitudinally until the pressure has resolved and then rewrap the outer layer. If you are not comfortable with these instructions, please call the office and the bandage can be changed for you.   FOLLOW UP: You will be called after surgery with an appointment date and  time, however if you have not received a phone call within 3 days, please call during regular office hours at [phone] to schedule a post operative appointment.  Please Seek Medical Attention if: Call MD for: pain or pressure in chest, jaw, arm, back, neck  Call MD for: temperature greater than 101 F for more than 24 hrs Call MD for: difficulty breathing Call MD for: incision redness, bleeding, drainage  Call MD for: palpitations or feeling that the heart is racing  Call MD for: increased swelling in arm, leg, ankle, or abdomen  Call MD for: lightheadedness, dizziness, fainting Call 911 or go to ER for any medical emergency if you are not able to get in touch with your doctor   J. Reid Spears, MD Orthopaedic Hand Surgeon [facility] Office number: [phone] [address] [place], [place] [address]   Post Anesthesia Home Care Instructions  Activity: Get plenty of rest for the remainder of the day. A responsible individual must stay with you for 24 hours following the procedure.  For the next 24 hours, DO NOT: -Drive a car -Operate machinery -Drink alcoholic beverages -Take any medication unless instructed by your physician -Make any legal decisions or sign important papers.  Meals: Start with liquid foods such as gelatin or soup. Progress to regular foods as tolerated. Avoid greasy, spicy, heavy foods. If nausea and/or vomiting occur, drink only clear liquids until the nausea and/or vomiting subsides. Call your physician if vomiting continues.  Special Instructions/Symptoms: Your throat may feel dry or sore from the anesthesia or the breathing tube placed in your throat during surgery. If this causes discomfort, gargle with warm   salt water. The discomfort should disappear within 24 hours.  Regional Anesthesia Blocks  1. Numbness or the inability to move the "blocked" extremity may last from 3-48 hours after placement. The length of time depends on the  medication injected and your individual response to the medication. If the numbness is not going away after 48 hours, call your surgeon.  2. The extremity that is blocked will need to be protected until the numbness is gone and the  Strength has returned. Because you cannot feel it, you will need to take extra care to avoid injury. Because it may be weak, you may have difficulty moving it or using it. You may not know what position it is in without looking at it while the block is in effect.  3. For blocks in the legs and feet, returning to weight bearing and walking needs to be done carefully. You will need to wait until the numbness is entirely gone and the strength has returned. You should be able to move your leg and foot normally before you try and bear weight or walk. You will need someone to be with you when you first try to ensure you do not fall and possibly risk injury.  4. Bruising and tenderness at the needle site are common side effects and will resolve in a few days.  5. Persistent numbness or new problems with movement should be communicated to the surgeon.  May take Tylenol beginning at 12:40 PM as needed for pain/soreness. Do not take extra Tylenol with Oxycodone(Percocet) as it has Tylenol in it.

## 2022-07-17 ENCOUNTER — Encounter (HOSPITAL_BASED_OUTPATIENT_CLINIC_OR_DEPARTMENT_OTHER): Payer: Self-pay | Admitting: Orthopedic Surgery

## 2022-09-12 ENCOUNTER — Ambulatory Visit: Payer: BC Managed Care – PPO | Admitting: Nurse Practitioner

## 2022-10-07 ENCOUNTER — Encounter: Payer: Self-pay | Admitting: Internal Medicine

## 2022-10-08 ENCOUNTER — Encounter: Payer: Self-pay | Admitting: Nurse Practitioner

## 2022-10-22 ENCOUNTER — Ambulatory Visit: Payer: BC Managed Care – PPO | Admitting: Nurse Practitioner

## 2022-10-23 ENCOUNTER — Other Ambulatory Visit: Payer: Self-pay

## 2022-10-23 ENCOUNTER — Emergency Department: Payer: BC Managed Care – PPO

## 2022-10-23 ENCOUNTER — Emergency Department
Admission: EM | Admit: 2022-10-23 | Discharge: 2022-10-23 | Disposition: A | Discharge status: Home / Self Care | Payer: BC Managed Care – PPO | Attending: Emergency Medicine | Admitting: Emergency Medicine

## 2022-10-23 DIAGNOSIS — Z3A01 Less than 8 weeks gestation of pregnancy: Secondary | ICD-10-CM | POA: Insufficient documentation

## 2022-10-23 DIAGNOSIS — O26891 Other specified pregnancy related conditions, first trimester: Secondary | ICD-10-CM | POA: Insufficient documentation

## 2022-10-23 DIAGNOSIS — Z1152 Encounter for screening for COVID-19: Secondary | ICD-10-CM | POA: Insufficient documentation

## 2022-10-23 DIAGNOSIS — B9789 Other viral agents as the cause of diseases classified elsewhere: Secondary | ICD-10-CM | POA: Diagnosis not present

## 2022-10-23 DIAGNOSIS — O99511 Diseases of the respiratory system complicating pregnancy, first trimester: Secondary | ICD-10-CM | POA: Diagnosis not present

## 2022-10-23 DIAGNOSIS — J069 Acute upper respiratory infection, unspecified: Secondary | ICD-10-CM | POA: Diagnosis not present

## 2022-10-23 LAB — URINALYSIS, ROUTINE W REFLEX MICROSCOPIC
Bilirubin Urine: NEGATIVE
Glucose, UA: NEGATIVE mg/dL
Hgb urine dipstick: NEGATIVE
Ketones, ur: NEGATIVE mg/dL
Nitrite: NEGATIVE
Protein, ur: NEGATIVE mg/dL
Specific Gravity, Urine: 1.021 (ref 1.005–1.030)
pH: 5 (ref 5.0–8.0)

## 2022-10-23 LAB — COMPREHENSIVE METABOLIC PANEL
ALT: 21 U/L (ref 0–44)
AST: 17 U/L (ref 15–41)
Albumin: 3.6 g/dL (ref 3.5–5.0)
Alkaline Phosphatase: 55 U/L (ref 38–126)
Anion gap: 8 (ref 5–15)
BUN: 15 mg/dL (ref 6–20)
CO2: 22 mmol/L (ref 22–32)
Calcium: 8.7 mg/dL — ABNORMAL LOW (ref 8.9–10.3)
Chloride: 108 mmol/L (ref 98–111)
Creatinine, Ser: 0.74 mg/dL (ref 0.44–1.00)
GFR, Estimated: >60 mL/min (ref >60)
Glucose, Bld: 108 mg/dL — ABNORMAL HIGH (ref 70–99)
Potassium: 3.6 mmol/L (ref 3.5–5.1)
Sodium: 138 mmol/L (ref 135–145)
Total Bilirubin: 0.7 mg/dL (ref 0.3–1.2)
Total Protein: 7.1 g/dL (ref 6.5–8.1)

## 2022-10-23 LAB — LIPASE, BLOOD: Lipase: 34 U/L (ref 11–51)

## 2022-10-23 LAB — CBC
HCT: 37.1 % (ref 36.0–46.0)
Hemoglobin: 12.2 g/dL (ref 12.0–15.0)
MCH: 29.6 pg (ref 26.0–34.0)
MCHC: 32.9 g/dL (ref 30.0–36.0)
MCV: 90 fL (ref 80.0–100.0)
Platelets: 248 10*3/uL (ref 150–400)
RBC: 4.12 MIL/uL (ref 3.87–5.11)
RDW: 13.9 % (ref 11.5–15.5)
WBC: 10 10*3/uL (ref 4.0–10.5)
nRBC: 0 % (ref 0.0–0.2)

## 2022-10-23 LAB — HCG, QUANTITATIVE, PREGNANCY: hCG, Beta Chain, Quant, S: 9531 m[IU]/mL — ABNORMAL HIGH (ref <5)

## 2022-10-23 LAB — RESP PANEL BY RT-PCR (FLU A&B, COVID) ARPGX2
Influenza A by PCR: NEGATIVE
Influenza B by PCR: NEGATIVE
SARS Coronavirus 2 by RT PCR: NEGATIVE

## 2022-10-23 LAB — POC URINE PREG, ED: Preg Test, Ur: POSITIVE — AB

## 2022-10-23 NOTE — ED Triage Notes (Signed)
Pt comes with c/o belly pain and cough that started yesterday. Pt states she has also missed her period.

## 2022-10-23 NOTE — ED Provider Notes (Signed)
[facility] Provider Note    Event Date/Time   First MD Initiated Contact with Patient 10/23/22 [phone]     (approximate)   History   Abdominal Pain and Cough   HPI  Kristy Wright is a 31 y.o. female with GERD, prior pyelonephritis who comes in with concerns for cough and congestion.  Patient reports that her last period was 10/14.  She reports having a positive pregnancy test at home.  But given her husband has been sick she wanted to come into the ER to be evaluated.  She reported that her husband had abdominal pain but she denies any abdominal pain.  She denies any vaginal bleeding.  This is her sixth pregnancy.  She is 5 children.  Denies any history of ectopic.  She reports a cough, congestion.  No sore throat.  Denies any known fevers.  Reports symptoms have been going ongoing for 2 days.  Reports husband has similar symptoms but also has some abdominal pain and diarrhea.  She denies any vaginal discharge.  She does not currently have an OB given her OB just recently retired.  I reviewed the records and patient underwent a carpal tunnel release on 8/22   Physical Exam   Triage Vital Signs: ED Triage Vitals  Enc Vitals Group     BP 10/23/22 0732 128/83     Pulse Rate 10/23/22 0732 85     Resp 10/23/22 0732 18     Temp 10/23/22 0732 98 F (36.7 C)     Temp src --      SpO2 10/23/22 0732 100 %     Weight --      Height --      Head Circumference --      Peak Flow --      Pain Score 10/23/22 0727 6     Pain Loc --      Pain Edu? --      Excl. in GC? --     Most recent vital signs: Vitals:   10/23/22 0732  BP: 128/83  Pulse: 85  Resp: 18  Temp: 98 F (36.7 C)  SpO2: 100%     General: Awake, no distress.  CV:  Good peripheral perfusion.  Resp:  Normal effort.  Abd:  No distention.  Soft nontender Other:  Oropharynx is clear uvula midline.  No erythema.   ED Results / Procedures / Treatments   Labs (all labs ordered are listed,  but only abnormal results are displayed) Labs Reviewed  POC URINE PREG, ED - Abnormal; Notable for the following components:      Result Value   Preg Test, Ur POSITIVE (*)    All other components within normal limits  RESP PANEL BY RT-PCR (FLU A&B, COVID) ARPGX2  LIPASE, BLOOD  COMPREHENSIVE METABOLIC PANEL  CBC  URINALYSIS, ROUTINE W REFLEX MICROSCOPIC  HCG, QUANTITATIVE, PREGNANCY      RADIOLOGY Patient declined chest x-ray   PROCEDURES:  Critical Care performed: No  Procedures   MEDICATIONS ORDERED IN ED: Medications - No data to display   IMPRESSION / MDM / ASSESSMENT AND PLAN / ED COURSE  I reviewed the triage vital signs and the nursing notes.   Patient's presentation is most consistent with acute presentation with potential threat to life or bodily function.   Patient comes in with concerns for congestion, cough.  She declined chest x-ray given denies any shortness of breath and patient is afebrile with 100% oxygen level.  We discussed any concerns for abdominal pain or vaginal bleeding to need ultrasound done today and she denies any of the symptoms to suggest ectopic.  When I asked her why the triage note stated that she was having abdominal pain she stated that her husband was here with abdominal pain and that they must of confused his symptoms with her symptoms.  She is adamant that she does not have any abdominal pain.  She reports prior pregnancies and having the same symptoms that she has had in previous pregnancies-- therefore we discussed holding off on ultrasound and will need to follow-up with OB/GYN for an ultrasound around 8-10 weeks.  Based upon her LMP she is probably about 6 weeks? But she dose report irrregular periods-  She expressed understanding and will return if she develops lower abdominal pain or vaginal bleeding.  As for the cough and congestion sounds most likely viral in nature.  Her oropharynx is clear and denies any sore throat to suggest  strep.   Brace test was positive.  Lipase normal.  CMP normal.  CBC reassuring.  Urine with squamous cells but no signs of UTI with 0-5 WBCs.  hCG elevated at 9000.  COVID, flu are negative  Repeat evaluation abdomen remains soft and nontender we again discussed imaging of opted to hold off given no signs of ectopic.  She is going to follow-up with OB/GYN.  Provided a list of resources for patient and understands precautions for returning.  We discussed symptomatic treatment for viral illness while pregnant    FINAL CLINICAL IMPRESSION(S) / ED DIAGNOSES   Final diagnoses:  Viral URI with cough  Less than [redacted] weeks gestation of pregnancy     Rx / DC Orders   ED Discharge Orders     None        Note:  This document was prepared using Dragon voice recognition software and may include unintentional dictation errors.   Concha Se, MD 10/23/22 [phone]

## 2022-10-23 NOTE — Discharge Instructions (Addendum)
You can take Tylenol 1 g every 8 hours. You can use flonase for nasal congestion You can use vitamin B6 25 mg every 6-8 hours to help with nausea. You can use doxylamine 12.5 mg every 6-8 hours but this can cause some drowsiness to help with nausea DO NOT take ibuprofen.  Take prenatal.   All of these you can get over-the-counter and you can talk to the pharmacist to help you find them.  Call OB/GYN to make a follow-up appointment and return to the ER if develop vaginal bleeding or abdominal pain or any other concerns

## 2022-10-25 ENCOUNTER — Ambulatory Visit: Payer: BC Managed Care – PPO | Admitting: Internal Medicine

## 2022-10-25 DIAGNOSIS — Z419 Encounter for procedure for purposes other than remedying health state, unspecified: Secondary | ICD-10-CM | POA: Diagnosis not present

## 2022-10-25 NOTE — Progress Notes (Deleted)
Name: Kristy Wright  MRN/ DOB: [id number], 1991-08-16    Age/ Sex: 31 y.o., female     PCP: Eden Emms, NP   Reason for Endocrinology Evaluation: Hypothyroidism     Initial Endocrinology Clinic Visit: ***    PATIENT IDENTIFIER: Kristy Wright is a 31 y.o., female with a past medical history of hypothyroidism and GERD. She has followed with [facility] Endocrinology clinic since *** for consultative assistance with management of her Hypothyroidism.   HISTORICAL SUMMARY: The patient was first diagnosed with Hypothyroidism  in 2009.   Thyroid ultrasound in 2014 showed an enlarged hypervascular thyroid but no nodules noted.  Repeat ultrasound 2019 was unchanged.    SUBJECTIVE:    Today (10/25/2022):  Kristy Wright is here for a follow up on hypothyroidism.   The patient had a positive pregnancy test on 10/23/2022      Levothyroxine 200 mcg daily      HISTORY:  Past Medical History:  Past Medical History:  Diagnosis Date   Carpal tunnel syndrome, bilateral    s/p release 05-01-2022   GERD (gastroesophageal reflux disease)    History of acute pyelonephritis    ED visit in epic 04-02-2022   Hypothyroidism    followed by pcp   (last ultrasound in epic 11-11-2018 no nodules)   Past Surgical History:  Past Surgical History:  Procedure Laterality Date   CARPAL TUNNEL RELEASE Right 05/07/2022   Procedure: Right Carpal Tunnel Release;  Surgeon: Gomez Cleverly, MD;  Location: [facility];  Service: Orthopedics;  Laterality: Right;  with local anesthesia   CARPAL TUNNEL RELEASE Left 07/16/2022   Procedure: CARPAL TUNNEL RELEASE;  Surgeon: Gomez Cleverly, MD;  Location: [facility];  Service: Orthopedics;  Laterality: Left;  with local anesthesia   Social History:  reports that she has never smoked. She has never used smokeless tobacco. She reports current alcohol use of about 2.0 - 3.0 standard drinks of alcohol per week. She reports that she  does not use drugs. Family History:  Family History  Problem Relation Age of Onset   Liver disease Father    Hypertension Father    Diabetes Father    Thyroid disease Sister        uncertain type   Hypertension Sister    Thyroid disease Brother    Healthy Daughter    Healthy Son    Healthy Son    Healthy Son    Healthy Son    Diabetes Maternal Grandmother    Heart disease Maternal Grandmother    Hypertension Maternal Grandmother    Stroke Maternal Grandmother    Cancer Paternal Grandmother        breast   Hypertension Paternal Grandmother    Arthritis Paternal Grandmother    Thyroid disease Paternal Grandmother    Diabetes Paternal Grandfather    Hypertension Paternal Grandfather    Stroke Other      HOME MEDICATIONS: Allergies as of 10/25/2022   No Known Allergies      Medication List        Accurate as of October 25, 2022  7:32 AM. If you have any questions, ask your nurse or doctor.          levothyroxine 200 MCG tablet Commonly known as: SYNTHROID TAKE 1 TABLET (200 MCG TOTAL) BY MOUTH DAILY BEFORE BREAKFAST.          OBJECTIVE:   PHYSICAL EXAM: VS: LMP 09/07/2022    EXAM: General: Pt  appears well and is in NAD  Eyes: External eye exam normal without stare, lid lag or exophthalmos.  EOM intact.    Neck: General: Supple without adenopathy. Thyroid: Thyroid size normal.  No goiter or nodules appreciated. No thyroid bruit.  Lungs: Clear with good BS bilat with no rales, rhonchi, or wheezes  Heart: Auscultation: RRR.  Abdomen: Normoactive bowel sounds, soft, nontender, without masses or organomegaly palpable  Extremities:  BL LE: No pretibial edema normal ROM and strength.  Mental Status: Judgment, insight: Intact Orientation: Oriented to time, place, and person Mood and affect: No depression, anxiety, or agitation     DATA REVIEWED: ***    ASSESSMENT / PLAN / RECOMMENDATIONS:   Hypothyroidism:  Plan: ***    Medications    ***   Signed electronically by: Lyndle Herrlich, MD  [facility] Endocrinology  [facility] [address], [address] [place], [place] [address] Phone: [phone] FAX: [phone]      CC: Eden Emms, NP [address] [place] [address] Phone: [phone]  Fax: [phone]   Return to Endocrinology clinic as below: Future Appointments  Date Time Provider Department Center  10/25/2022 10:50 AM Maryl Blalock, Konrad Dolores, MD [facility] None

## 2022-10-30 DIAGNOSIS — Z23 Encounter for immunization: Secondary | ICD-10-CM | POA: Diagnosis not present

## 2022-10-30 DIAGNOSIS — Z3201 Encounter for pregnancy test, result positive: Secondary | ICD-10-CM | POA: Diagnosis not present

## 2022-11-09 ENCOUNTER — Inpatient Hospital Stay (HOSPITAL_COMMUNITY)
Admission: AD | Admit: 2022-11-09 | Discharge: 2022-11-09 | Disposition: A | Discharge status: Home / Self Care | Payer: BC Managed Care – PPO | Attending: Obstetrics and Gynecology | Admitting: Obstetrics and Gynecology

## 2022-11-09 ENCOUNTER — Encounter: Payer: Self-pay | Admitting: Student

## 2022-11-09 DIAGNOSIS — Z3A09 9 weeks gestation of pregnancy: Secondary | ICD-10-CM | POA: Insufficient documentation

## 2022-11-09 DIAGNOSIS — O219 Vomiting of pregnancy, unspecified: Secondary | ICD-10-CM | POA: Diagnosis not present

## 2022-11-09 LAB — URINALYSIS, ROUTINE W REFLEX MICROSCOPIC
Bacteria, UA: NONE SEEN
Bilirubin Urine: NEGATIVE
Glucose, UA: NEGATIVE mg/dL
Hgb urine dipstick: NEGATIVE
Ketones, ur: NEGATIVE mg/dL
Nitrite: NEGATIVE
Protein, ur: 30 mg/dL — AB
Specific Gravity, Urine: 1.027 (ref 1.005–1.030)
Squamous Epithelial / HPF: >50 — ABNORMAL HIGH (ref 0–5)
pH: 5 (ref 5.0–8.0)

## 2022-11-09 MED ORDER — METOCLOPRAMIDE HCL 10 MG PO TABS
10.0000 mg | ORAL_TABLET | Freq: Once | ORAL | Status: AC
  Administered 2022-11-09: 10 mg via ORAL
  Filled 2022-11-09: qty 1

## 2022-11-09 NOTE — MAU Note (Signed)
.  Kristy Wright is a 31 y.o. at [redacted]w[redacted]d here in MAU reporting: n/v x 2-3 days. Can't keep anything down. Feels like she is going to pass out when she vomits. Taking unisome and b6 but it is not helping LMP:  Onset of complaint: 2-3 days Pain score: 0 Vitals:   11/09/22 1158  BP: 121/64  Pulse: 77  Resp: 18  Temp: 97.9 F (36.6 C)     FHT:n/a Lab orders placed from triage:  u/a

## 2022-11-09 NOTE — MAU Provider Note (Signed)
History     [id number]  Arrival date and time: 11/09/22 1132    Chief Complaint  Patient presents with   Nausea   Emesis     HPI Kristy Wright is a 31 y.o. at [redacted]w[redacted]d who presents for nausea & vomiting. Reports nausea during pregnancy previously treated with diclegis. For the last 2-3 days vomiting has gotten worse. States she vomited every time she ate yesterday. Vomited once this morning after eating cereal. Hasn't had anything to eat or drink since then. Took diclegis yesterday without relief. Was prescribed reglan today but hasn't filled prescription yet. Denies fever, diarrhea, abdominal pain, or vaginal bleeding. Has appt with OB next week.    OB History     Gravida  6   Para  5   Term  5   Preterm      AB      Living  5      SAB      IAB      Ectopic      Multiple      Live Births  5           Past Medical History:  Diagnosis Date   Carpal tunnel syndrome, bilateral    s/p release 05-01-2022   GERD (gastroesophageal reflux disease)    History of acute pyelonephritis    ED visit in epic 04-02-2022   Hypothyroidism    followed by pcp   (last ultrasound in epic 11-11-2018 no nodules)    Past Surgical History:  Procedure Laterality Date   CARPAL TUNNEL RELEASE Right 05/07/2022   Procedure: Right Carpal Tunnel Release;  Surgeon: Orene Desanctis, MD;  Location: [facility];  Service: Orthopedics;  Laterality: Right;  with local anesthesia   CARPAL TUNNEL RELEASE Left 07/16/2022   Procedure: CARPAL TUNNEL RELEASE;  Surgeon: Orene Desanctis, MD;  Location: [facility];  Service: Orthopedics;  Laterality: Left;  with local anesthesia    Family History  Problem Relation Age of Onset   Liver disease Father    Hypertension Father    Diabetes Father    Thyroid disease Sister        uncertain type   Hypertension Sister    Thyroid disease Brother    Healthy Daughter    Healthy Son    Healthy Son    Healthy Son     Healthy Son    Diabetes Maternal Grandmother    Heart disease Maternal Grandmother    Hypertension Maternal Grandmother    Stroke Maternal Grandmother    Cancer Paternal Grandmother        breast   Hypertension Paternal Grandmother    Arthritis Paternal Grandmother    Thyroid disease Paternal Grandmother    Diabetes Paternal Grandfather    Hypertension Paternal Grandfather    Stroke Other     No Known Allergies  No current facility-administered medications on file prior to encounter.   Current Outpatient Medications on File Prior to Encounter  Medication Sig Dispense Refill   doxylamine, Sleep, (UNISOM) 25 MG tablet Take 25 mg by mouth at bedtime as needed (nausea/vomiting).     levothyroxine (SYNTHROID) 200 MCG tablet TAKE 1 TABLET (200 MCG TOTAL) BY MOUTH DAILY BEFORE BREAKFAST. (Patient taking differently: Take 200 mcg by mouth daily before breakfast.) 30 tablet 3   Prenatal Vit-Fe Fumarate-FA (MULTIVITAMIN-PRENATAL) 27-0.8 MG TABS tablet Take 1 tablet by mouth daily at 12 noon.     pyridOXINE (VITAMIN B6) 100 MG tablet Take  100 mg by mouth at bedtime as needed (nausea/vomiting).     REGLAN 10 MG tablet Take 10 mg by mouth every 6 (six) hours as needed.       ROS Pertinent positives and negative per HPI, all others reviewed and negative  Physical Exam   BP 119/64 (BP Location: Right Arm)   Pulse 76   Temp 98.1 F (36.7 C) (Oral)   Resp 16   Ht 5\' 2"  (1.575 m)   Wt 111.1 kg   LMP 09/07/2022   SpO2 100% Comment: room air  BMI 44.81 kg/m   Patient Vitals for the past 24 hrs:  BP Temp Temp src Pulse Resp SpO2 Height Weight  11/09/22 1548 119/64 -- -- 76 16 100 % -- --  11/09/22 1547 -- 98.1 F (36.7 C) Oral -- -- -- -- --  11/09/22 1412 (!) 97/57 -- -- 71 18 98 % -- --  11/09/22 1158 121/64 97.9 F (36.6 C) -- 77 18 -- 5\' 2"  (1.575 m) 111.1 kg    Physical Exam Vitals and nursing note reviewed.  Constitutional:      General: She is not in acute distress.     Appearance: Normal appearance.  HENT:     Head: Normocephalic and atraumatic.  Eyes:     General: No scleral icterus.    Conjunctiva/sclera: Conjunctivae normal.  Pulmonary:     Effort: Pulmonary effort is normal. No respiratory distress.  Neurological:     Mental Status: She is alert.  Psychiatric:        Mood and Affect: Mood normal.        Behavior: Behavior normal.      Labs Results for orders placed or performed during the hospital encounter of 11/09/22 (from the past 24 hour(s))  Urinalysis, Routine w reflex microscopic Urine, Clean Catch     Status: Abnormal   Collection Time: 11/09/22 12:12 PM  Result Value Ref Range   Color, Urine AMBER (A) YELLOW   APPearance CLOUDY (A) CLEAR   Specific Gravity, Urine 1.027 1.005 - 1.030   pH 5.0 5.0 - 8.0   Glucose, UA NEGATIVE NEGATIVE mg/dL   Hgb urine dipstick NEGATIVE NEGATIVE   Bilirubin Urine NEGATIVE NEGATIVE   Ketones, ur NEGATIVE NEGATIVE mg/dL   Protein, ur 30 (A) NEGATIVE mg/dL   Nitrite NEGATIVE NEGATIVE   Leukocytes,Ua LARGE (A) NEGATIVE   RBC / HPF 0-5 0 - 5 RBC/hpf   WBC, UA 21-50 0 - 5 WBC/hpf   Bacteria, UA NONE SEEN NONE SEEN   Squamous Epithelial / LPF >50 (H) 0 - 5   Mucus PRESENT    Amorphous Crystal PRESENT     Imaging No results found.  MAU Course  Procedures Lab Orders         Urinalysis, Routine w reflex microscopic Urine, Clean Catch    Meds ordered this encounter  Medications   metoCLOPramide (REGLAN) tablet 10 mg   Imaging Orders  No imaging studies ordered today    MDM No vomiting in MAU & U/a reassuring.  Given oral reglan in mau. No vomiting during encounter. Able to keep down drink & crackers. Reports improvement in symptoms  Assessment and Plan   1. Nausea and vomiting of pregnancy, antepartum   2. [redacted] weeks gestation of pregnancy    -Encouraged to pick up reglan prescription prescribed earlier today -Advance diet as tolerated -Return to MAU for worsening symptoms   11/11/22, NP 11/09/22 3:53 PM

## 2022-11-14 DIAGNOSIS — Z369 Encounter for antenatal screening, unspecified: Secondary | ICD-10-CM | POA: Diagnosis not present

## 2022-11-14 DIAGNOSIS — Z113 Encounter for screening for infections with a predominantly sexual mode of transmission: Secondary | ICD-10-CM | POA: Diagnosis not present

## 2022-11-14 DIAGNOSIS — Z3A08 8 weeks gestation of pregnancy: Secondary | ICD-10-CM | POA: Diagnosis not present

## 2022-11-14 DIAGNOSIS — O99211 Obesity complicating pregnancy, first trimester: Secondary | ICD-10-CM | POA: Diagnosis not present

## 2022-11-14 DIAGNOSIS — O26891 Other specified pregnancy related conditions, first trimester: Secondary | ICD-10-CM | POA: Diagnosis not present

## 2022-11-14 LAB — OB RESULTS CONSOLE GC/CHLAMYDIA
Chlamydia: NEGATIVE
Neisseria Gonorrhea: NEGATIVE

## 2022-11-14 LAB — OB RESULTS CONSOLE RUBELLA ANTIBODY, IGM: Rubella: IMMUNE

## 2022-11-14 LAB — OB RESULTS CONSOLE HIV ANTIBODY (ROUTINE TESTING): HIV: NONREACTIVE

## 2022-11-14 LAB — OB RESULTS CONSOLE RPR: RPR: NONREACTIVE

## 2022-11-14 LAB — OB RESULTS CONSOLE HEPATITIS B SURFACE ANTIGEN: Hepatitis B Surface Ag: NEGATIVE

## 2022-11-14 LAB — HEPATITIS C ANTIBODY: HCV Ab: NEGATIVE

## 2022-11-25 DIAGNOSIS — Z419 Encounter for procedure for purposes other than remedying health state, unspecified: Secondary | ICD-10-CM | POA: Diagnosis not present

## 2022-11-25 NOTE — L&D Delivery Note (Addendum)
DELIVERY NOTE  Pt complete and at +2 station with urge to push. Epidural controlling pain. Pt pushed and delivered a viable female infant in ROA position. Anterior and posterior shoulders spontaneously delivered with next two pushes; body easily followed next. Infant placed on mothers abdomen and bulb suction of mouth and nose performed. Cord was then clamped and cut by FOB. Cord blood obtained, 3VC. Baby had a vigorous spontaneous cry noted. Placenta then delivered at 2235 intact. Fundal massage performed and pitocin per protocol. Fundus firm. The following lacerations were noted: NONE, EBL 351cc. Mother and baby stable. Counts correct   Infant time: 2230 Gender: female, desires circ Placenta time: 2235 Apgars: 9/9 Weight: pending skin-to-skin  Restart patient Synthroid in AM

## 2022-12-04 ENCOUNTER — Other Ambulatory Visit: Payer: Self-pay | Admitting: Internal Medicine

## 2022-12-10 DIAGNOSIS — Z3481 Encounter for supervision of other normal pregnancy, first trimester: Secondary | ICD-10-CM | POA: Diagnosis not present

## 2022-12-26 DIAGNOSIS — Z419 Encounter for procedure for purposes other than remedying health state, unspecified: Secondary | ICD-10-CM | POA: Diagnosis not present

## 2023-01-13 ENCOUNTER — Encounter: Payer: Self-pay | Admitting: Nurse Practitioner

## 2023-01-15 ENCOUNTER — Encounter: Payer: Self-pay | Admitting: Nurse Practitioner

## 2023-01-15 ENCOUNTER — Ambulatory Visit (INDEPENDENT_AMBULATORY_CARE_PROVIDER_SITE_OTHER): Payer: BC Managed Care – PPO | Admitting: Nurse Practitioner

## 2023-01-15 VITALS — BP 110/68 | HR 79 | Temp 98.7°F | Resp 16 | Ht 62.0 in | Wt 244.4 lb

## 2023-01-15 DIAGNOSIS — E039 Hypothyroidism, unspecified: Secondary | ICD-10-CM | POA: Diagnosis not present

## 2023-01-15 DIAGNOSIS — Z3A17 17 weeks gestation of pregnancy: Secondary | ICD-10-CM | POA: Diagnosis not present

## 2023-01-15 DIAGNOSIS — R5383 Other fatigue: Secondary | ICD-10-CM | POA: Diagnosis not present

## 2023-01-15 DIAGNOSIS — E559 Vitamin D deficiency, unspecified: Secondary | ICD-10-CM | POA: Diagnosis not present

## 2023-01-15 MED ORDER — LEVOTHYROXINE SODIUM 200 MCG PO TABS: 200.0000 ug | ORAL_TABLET | Freq: Every day | ORAL | 1 refills | Status: DC

## 2023-01-15 NOTE — Assessment & Plan Note (Signed)
Patient is currently followed by OB/GYN.  [redacted] weeks pregnant with a baby boy

## 2023-01-15 NOTE — Assessment & Plan Note (Signed)
Check CBC, TSH, vitamin D.

## 2023-01-15 NOTE — Assessment & Plan Note (Signed)
Patient is complaining of overt fatigue does have history of vitamin D deficiency pending labs

## 2023-01-15 NOTE — Patient Instructions (Signed)
Nice to see you today I have refilled your thyroid medication Follow up with me in approx 6 months for your physical, sooner if you need me I will be in touch with the labs once I have them

## 2023-01-15 NOTE — Assessment & Plan Note (Signed)
History of the same.  Patient is on 200 mcg of the medications she does take it correctly per her report.  Is been off of it for a few days last TSH was outside of normal limits.  Patient is also [redacted] weeks gestation female pending TSH refill 200 mcg levothyroxine

## 2023-01-15 NOTE — Progress Notes (Signed)
   Established Patient Office Visit  Subjective   Patient ID: Kristy Wright, female    DOB: January 14, 1991  Age: 32 y.o. MRN: [id number]  Chief Complaint  Patient presents with   Medication Refill    Medication Refill Pertinent negatives include no abdominal pain, chest pain, chills, fever, nausea or vomiting.    Hypothyroidism: States that she has been out of it since [place]. States that she is followed by OBGYN in [place]. States [redacted] weeks pregnant. State that she does tolerate the medication well. States that she does take the medication in the morning with nothing but water and waits 30 mins before consuming anything else    Review of Systems  Constitutional:  Positive for malaise/fatigue. Negative for chills and fever.  Respiratory:  Negative for shortness of breath.   Cardiovascular:  Negative for chest pain and leg swelling.  Gastrointestinal:  Positive for constipation. Negative for abdominal pain, nausea and vomiting.  Genitourinary:  Negative for dysuria and hematuria.      Objective:     BP 110/68   Pulse 79   Temp 98.7 F (37.1 C)   Resp 16   Ht 5' 2"$  (1.575 m)   Wt 244 lb 6 oz (110.8 kg)   LMP 09/07/2022   SpO2 99%   BMI 44.70 kg/m    Physical Exam Vitals and nursing note reviewed.  Constitutional:      Appearance: Normal appearance.  Cardiovascular:     Rate and Rhythm: Normal rate and regular rhythm.     Heart sounds: Normal heart sounds.  Pulmonary:     Effort: Pulmonary effort is normal.     Breath sounds: Normal breath sounds.  Lymphadenopathy:     Cervical: No cervical adenopathy.  Neurological:     Mental Status: She is alert.      No results found for any visits on 01/15/23.    The ASCVD Risk score (Arnett DK, et al., 2019) failed to calculate for the following reasons:   The 2019 ASCVD risk score is only valid for ages 60 to 40    Assessment & Plan:   Problem List Items Addressed This Visit       Endocrine   Hypothyroidism  - Primary    History of the same.  Patient is on 200 mcg of the medications she does take it correctly per her report.  Is been off of it for a few days last TSH was outside of normal limits.  Patient is also [redacted] weeks gestation female pending TSH refill 200 mcg levothyroxine      Relevant Medications   levothyroxine (SYNTHROID) 200 MCG tablet   Other Relevant Orders   TSH     Other   Vitamin D deficiency    Patient is complaining of overt fatigue does have history of vitamin D deficiency pending labs      Relevant Orders   VITAMIN D 25 Hydroxy (Vit-D Deficiency, Fractures)   Other fatigue    Check CBC, TSH, vitamin D.      Relevant Orders   CBC   TSH   VITAMIN D 25 Hydroxy (Vit-D Deficiency, Fractures)   [redacted] weeks gestation of pregnancy    Patient is currently followed by OB/GYN.  [redacted] weeks pregnant with a baby boy       Return in about 6 months (around 07/16/2023) for CPE and Labs.    Romilda Garret, NP

## 2023-01-16 LAB — CBC
HCT: 32.6 % — ABNORMAL LOW (ref 36.0–46.0)
Hemoglobin: 11.1 g/dL — ABNORMAL LOW (ref 12.0–15.0)
MCHC: 34 g/dL (ref 30.0–36.0)
MCV: 89.9 fl (ref 78.0–100.0)
Platelets: 217 10*3/uL (ref 150.0–400.0)
RBC: 3.63 Mil/uL — ABNORMAL LOW (ref 3.87–5.11)
RDW: 14.4 % (ref 11.5–15.5)
WBC: 8.4 10*3/uL (ref 4.0–10.5)

## 2023-01-16 LAB — VITAMIN D 25 HYDROXY (VIT D DEFICIENCY, FRACTURES): VITD: 9.79 ng/mL — ABNORMAL LOW (ref 30.00–100.00)

## 2023-01-16 LAB — TSH: TSH: 3.6 u[IU]/mL (ref 0.35–5.50)

## 2023-01-20 ENCOUNTER — Other Ambulatory Visit: Payer: Self-pay

## 2023-01-20 ENCOUNTER — Encounter: Payer: Self-pay | Admitting: Emergency Medicine

## 2023-01-20 ENCOUNTER — Emergency Department
Admission: EM | Admit: 2023-01-20 | Discharge: 2023-01-20 | Disposition: A | Discharge status: Home / Self Care | Payer: BC Managed Care – PPO | Attending: Emergency Medicine | Admitting: Emergency Medicine

## 2023-01-20 DIAGNOSIS — Z3A18 18 weeks gestation of pregnancy: Secondary | ICD-10-CM | POA: Diagnosis not present

## 2023-01-20 DIAGNOSIS — O26892 Other specified pregnancy related conditions, second trimester: Secondary | ICD-10-CM | POA: Diagnosis not present

## 2023-01-20 DIAGNOSIS — O219 Vomiting of pregnancy, unspecified: Secondary | ICD-10-CM | POA: Insufficient documentation

## 2023-01-20 DIAGNOSIS — R42 Dizziness and giddiness: Secondary | ICD-10-CM

## 2023-01-20 DIAGNOSIS — E039 Hypothyroidism, unspecified: Secondary | ICD-10-CM | POA: Diagnosis not present

## 2023-01-20 LAB — URINALYSIS, ROUTINE W REFLEX MICROSCOPIC
Bilirubin Urine: NEGATIVE
Glucose, UA: NEGATIVE mg/dL
Hgb urine dipstick: NEGATIVE
Ketones, ur: NEGATIVE mg/dL
Leukocytes,Ua: NEGATIVE
Nitrite: NEGATIVE
Protein, ur: NEGATIVE mg/dL
Specific Gravity, Urine: 1.025 (ref 1.005–1.030)
pH: 6 (ref 5.0–8.0)

## 2023-01-20 LAB — TSH: TSH: 2.509 u[IU]/mL (ref 0.350–4.500)

## 2023-01-20 LAB — CBC
HCT: 32 % — ABNORMAL LOW (ref 36.0–46.0)
Hemoglobin: 10.6 g/dL — ABNORMAL LOW (ref 12.0–15.0)
MCH: 30 pg (ref 26.0–34.0)
MCHC: 33.1 g/dL (ref 30.0–36.0)
MCV: 90.7 fL (ref 80.0–100.0)
Platelets: 205 10*3/uL (ref 150–400)
RBC: 3.53 MIL/uL — ABNORMAL LOW (ref 3.87–5.11)
RDW: 14.1 % (ref 11.5–15.5)
WBC: 7.7 10*3/uL (ref 4.0–10.5)
nRBC: 0 % (ref 0.0–0.2)

## 2023-01-20 LAB — TYPE AND SCREEN
ABO/RH(D): A POS
Antibody Screen: NEGATIVE

## 2023-01-20 LAB — BASIC METABOLIC PANEL
Anion gap: 7 (ref 5–15)
BUN: 10 mg/dL (ref 6–20)
CO2: 22 mmol/L (ref 22–32)
Calcium: 8.6 mg/dL — ABNORMAL LOW (ref 8.9–10.3)
Chloride: 108 mmol/L (ref 98–111)
Creatinine, Ser: 0.52 mg/dL (ref 0.44–1.00)
GFR, Estimated: >60 mL/min (ref >60)
Glucose, Bld: 112 mg/dL — ABNORMAL HIGH (ref 70–99)
Potassium: 3.3 mmol/L — ABNORMAL LOW (ref 3.5–5.1)
Sodium: 137 mmol/L (ref 135–145)

## 2023-01-20 LAB — T4, FREE: Free T4: 0.64 ng/dL (ref 0.61–1.12)

## 2023-01-20 MED ORDER — ONDANSETRON HCL 4 MG/2ML IJ SOLN
4.0000 mg | Freq: Once | INTRAMUSCULAR | Status: AC
  Administered 2023-01-20: 4 mg via INTRAVENOUS
  Filled 2023-01-20: qty 2

## 2023-01-20 MED ORDER — ONDANSETRON HCL 4 MG PO TABS: 4.0000 mg | ORAL_TABLET | Freq: Every day | ORAL | 1 refills | Status: DC | PRN

## 2023-01-20 MED ORDER — SODIUM CHLORIDE 0.9 % IV BOLUS
1000.0000 mL | Freq: Once | INTRAVENOUS | Status: AC
  Administered 2023-01-20: 1000 mL via INTRAVENOUS

## 2023-01-20 NOTE — ED Provider Notes (Signed)
[facility] Provider Note    Event Date/Time   First MD Initiated Contact with Patient 01/20/23 1542     (approximate)   History   Dizziness   HPI  Kristy Wright is a 32 y.o. female   Past medical history of approximately [redacted] weeks pregnant, hypothyroidism, GERD, and persistent lightheadedness nausea and vomiting throughout her pregnancy who presents to the emergency department with persistent lightheaded nausea vomiting.  She was recently put on iron pills for some anemia by her PMD or gynecologist.  She denies vaginal bleeding, gush of fluids, fevers chills or any other acute medical complaints.  She took Diclegis early in the pregnancy which really helped with her nausea and vomiting but made her excessively sleepy so she stopped using it.  She denies respiratory complaints, GI complaints, GU complaints.  Independent Historian contributed to assessment above: Husband at bedside  External Medical Documents Reviewed: Gynecology note dated 01/07/2023 which notes nausea vomiting improving and occasional lightheadedness, encouraging push fluids and small frequent meals and a normal fundus exam.      Physical Exam   Triage Vital Signs: ED Triage Vitals [01/20/23 1400]  Enc Vitals Group     BP 126/76     Pulse Rate 86     Resp 18     Temp 98.6 F (37 C)     Temp Source Oral     SpO2 98 %     Weight      Height      Head Circumference      Peak Flow      Pain Score 0     Pain Loc      Pain Edu?      Excl. in [place]?     Most recent vital signs: Vitals:   01/20/23 1400  BP: 126/76  Pulse: 86  Resp: 18  Temp: 98.6 F (37 C)  SpO2: 98%    General: Awake, no distress.  CV:  Good peripheral perfusion.  Resp:  Normal effort.  Abd:  No distention.  Other:  Gravid abdomen nontender to palpation ultrasound shows single intrauterine pregnancy with fetal heartbeat normal.  Skin appears warm well-perfused normal vital signs.  Moving all  extremities motor or sensory intact and gait is stable.   ED Results / Procedures / Treatments   Labs (all labs ordered are listed, but only abnormal results are displayed) Labs Reviewed  BASIC METABOLIC PANEL - Abnormal; Notable for the following components:      Result Value   Potassium 3.3 (*)    Glucose, Bld 112 (*)    Calcium 8.6 (*)    All other components within normal limits  CBC - Abnormal; Notable for the following components:   RBC 3.53 (*)    Hemoglobin 10.6 (*)    HCT 32.0 (*)    All other components within normal limits  URINALYSIS, ROUTINE W REFLEX MICROSCOPIC  TSH  T4, FREE  CBG MONITORING, ED  TYPE AND SCREEN     I ordered and reviewed the above labs they are notable for hemoglobin of 10.6 from 11.1 from 12.2 over the last several months.  EKG  ED ECG REPORT I, Lucillie Garfinkel, the attending physician, personally viewed and interpreted this ECG.   Date: 01/20/2023  EKG Time: 1409  Rate: 87  Rhythm: normal sinus rhythm  Axis: lad  Intervals:none  ST&T Change: No acute ischemic changes     PROCEDURES:  Critical Care performed: No  Procedures  MEDICATIONS ORDERED IN ED: Medications  sodium chloride 0.9 % bolus 1,000 mL (has no administration in time range)  ondansetron (ZOFRAN) injection 4 mg (has no administration in time range)     IMPRESSION / MDM / ASSESSMENT AND PLAN / ED COURSE  I reviewed the triage vital signs and the nursing notes.                                Patient's presentation is most consistent with acute presentation with potential threat to life or bodily function.  Differential diagnosis includes, but is not limited to, anemia of pregnancy, iron deficient anemia, blood loss anemia, hypothyroid, nausea and vomiting associated with pregnancy, dehydration, metabolic derangements, infection, CVA, ACS or dysrhythmia   The patient is on the cardiac monitor to evaluate for evidence of arrhythmia and/or significant heart  rate changes.  MDM: Chronic symptoms ongoing since her pregnancy no longer taking antiemetic due to side effects of excessive sleepiness.  Will trial Zofran instead, given prescription for the same.  Will give IV fluids due to poor p.o. intake and in the setting of frequent nausea and vomiting.  No focal neurologic deficits to suggest CVA or other intracranial pathologies.  No other acute focal infectious symptoms to suggest infection.  Increased nausea ever since starting her iron pills.  Anemia is not significant vital signs normal no signs of bleeding does not need blood transfusion today may be related to iron deficiency or dilution anemia for pregnancy.  Check thyroid studies.  Labs otherwise unremarkable the plan will be for PMD/gynecology follow-up         FINAL CLINICAL IMPRESSION(S) / ED DIAGNOSES   Final diagnoses:  Lightheadedness  Nausea/vomiting in pregnancy     Rx / DC Orders   ED Discharge Orders          Ordered    ondansetron (ZOFRAN) 4 MG tablet  Daily PRN        01/20/23 1620             Note:  This document was prepared using Dragon voice recognition software and may include unintentional dictation errors.    Lucillie Garfinkel, MD 01/20/23 [phone]

## 2023-01-20 NOTE — Discharge Instructions (Addendum)
Thank you for choosing us for your health care today!  Please see your primary doctor this week for a follow up appointment.   Sometimes, in the early stages of certain disease courses it is difficult to detect in the emergency department evaluation -- so, it is important that you continue to monitor your symptoms and call your doctor right away or return to the emergency department if you develop any new or worsening symptoms.  Please go to the following website to schedule new (and existing) patient appointments:   [email or web address]  If you do not have a primary doctor try calling the following clinics to establish care:  If you have insurance:  [facility] [phone] [address] [place] [address]   [facility]  [phone] [address] [place] [address] [place] [address]   If you do not have insurance:  [facility]  [phone] [address] [place] [address]   The following is another list of primary care offices in the area who are accepting new patients at this time.  Please reach out to one of them directly and let them know you would like to schedule an appointment to follow up on an Emergency Department visit, and/or to establish a new primary care provider (PCP).  There are likely other primary care clinics in the are who are accepting new patients, but this is an excellent place to start:  [facility] Lead physician: Dr Angela Bacigalupo [address] [place], [place] [address] [phone]  [facility] Lead Physician: Dr Krichna Sowles [address], [place], [place] [address] [phone]  [facility]  Lead Physician: Dr Megan Johnson [address], [place], [place] [address] [phone]  [facility] [place] [facility] Lead Physician: Dr Alex Karamalegos [address], [place], [place] [address] [phone]  [facility] [place] Lead Physician: Dr Laura Berglund [address], [place], [place] [address] [phone]   It was my pleasure to care for you today.   Kindall Swaby S. Donavin Audino, MD  

## 2023-01-20 NOTE — ED Triage Notes (Signed)
Patient to ED for dizziness and weakness. Patient recently started taking iron supplements for anemia. Patient appears pale in color. Currently [redacted] weeks pregnant.

## 2023-01-21 ENCOUNTER — Encounter: Payer: Self-pay | Admitting: Nurse Practitioner

## 2023-01-22 ENCOUNTER — Ambulatory Visit (INDEPENDENT_AMBULATORY_CARE_PROVIDER_SITE_OTHER): Payer: BC Managed Care – PPO | Admitting: Nurse Practitioner

## 2023-01-22 ENCOUNTER — Encounter: Payer: Self-pay | Admitting: Nurse Practitioner

## 2023-01-22 VITALS — BP 120/68 | HR 96 | Temp 98.7°F | Resp 16 | Ht 62.0 in | Wt 244.2 lb

## 2023-01-22 DIAGNOSIS — R112 Nausea with vomiting, unspecified: Secondary | ICD-10-CM

## 2023-01-22 DIAGNOSIS — R110 Nausea: Secondary | ICD-10-CM | POA: Insufficient documentation

## 2023-01-22 DIAGNOSIS — R0602 Shortness of breath: Secondary | ICD-10-CM | POA: Diagnosis not present

## 2023-01-22 DIAGNOSIS — Z09 Encounter for follow-up examination after completed treatment for conditions other than malignant neoplasm: Secondary | ICD-10-CM | POA: Insufficient documentation

## 2023-01-22 DIAGNOSIS — R0789 Other chest pain: Secondary | ICD-10-CM | POA: Diagnosis not present

## 2023-01-22 NOTE — Patient Instructions (Addendum)
Nice to see you today Try to add in a zero Gatorade with the water.  Have the nausea pill at home incase you need me Follow up with GYN as scheduled Me as needed

## 2023-01-22 NOTE — Assessment & Plan Note (Signed)
Patient was seen in the emergency department.  Did review labs and ED provider note.

## 2023-01-22 NOTE — Assessment & Plan Note (Signed)
Ambiguous in nature.  Nighttime with atypical chest pain.  Tired with movement.  No history of blood clots.  Lungs clear to auscultation today query if this is secondary to anxiety.

## 2023-01-22 NOTE — Assessment & Plan Note (Addendum)
Has stopped since patient has stopped taking the over-the-counter oral iron and vitamin D.  She does have antiemetics at home inclusive of Reglan and ondansetron.  Patient states she is able to hold fluids down and eat small frequent meals.  Patient has been in communication with her GYN who informed patient to stop over-the-counter iron and vitamin D.  Told her to continue following with her and her recommendations.  Monitor condition be to start back vitamin D first to see if she can tolerate the 5000 IUs daily.  And then add in the iron.  If iron is the cause may be switched to a slow release iron

## 2023-01-22 NOTE — Progress Notes (Signed)
Acute Office Visit  Subjective:     Patient ID: Kristy [place], female    DOB: 08-13-1991, 32 y.o.   MRN: [id number]  Chief Complaint  Patient presents with   Dizziness   Emesis    Started when she started taking iron and vitamin D   Shortness of Breath     Patient is in today for multiple complaints  States that it started on Friday. States that her GYn told her to start taking the vitamin D 5,000 dialy and iron Monday wendesay Friday. States that she did touch based with her GYn. States that they told her to stop the iron and vitamin D   Emesis: states that the last time she vomited was yesterday. Just once yesterday. States that she did not take the medicaoitn yesterday or today  Drinking plenty of water. States that she was able to eat yesterday she has been doing smaller portions  States that she did try the iorn with and with out food. Tried in the am and pm. States that it did not matter  Dizziness  Shortness of breath/ chest discomfort: states that she will get an intermittes sharp pain on the left upper chest that last for seconds. States that she will get shortness of breath that is intermittnet but separate from the pain and can be at rest and with exertion. States that it wll last approx 1 moin and she will take deep breaths and that will help  Review of Systems  Constitutional:  Negative for chills and fever.  HENT:  Negative for ear discharge, ear pain, sinus pain and sore throat.   Respiratory:  Positive for shortness of breath. Negative for cough.   Cardiovascular:  Positive for chest pain.  Gastrointestinal:  Positive for vomiting.  Neurological:  Positive for dizziness. Negative for headaches.        Objective:    BP 120/68   Pulse 96   Temp 98.7 F (37.1 C)   Resp 16   Ht '5\' 2"'$  (1.575 m)   Wt 244 lb 4 oz (110.8 kg)   LMP 09/07/2022   SpO2 98%   BMI 44.67 kg/m    Physical Exam Vitals and nursing note reviewed.  Constitutional:       Appearance: Normal appearance.  HENT:     Mouth/Throat:     Mouth: Mucous membranes are moist.     Pharynx: Oropharynx is clear.  Eyes:     Extraocular Movements: Extraocular movements intact.     Pupils: Pupils are equal, round, and reactive to light.  Cardiovascular:     Rate and Rhythm: Normal rate and regular rhythm.     Heart sounds: Normal heart sounds.  Pulmonary:     Effort: Pulmonary effort is normal.     Breath sounds: Normal breath sounds.  Chest:     Chest wall: No tenderness.  Neurological:     Mental Status: She is alert.     No results found for any visits on 01/22/23.      Assessment & Plan:   Problem List Items Addressed This Visit       Digestive   Nausea and vomiting - Primary    Has stopped since patient has stopped taking the over-the-counter oral iron and vitamin D.  She does have antiemetics at home inclusive of Reglan and ondansetron.  Patient states she is able to hold fluids down and eat small frequent meals.  Patient has been in communication with her GYN who  informed patient to stop over-the-counter iron and vitamin D.  Told her to continue following with her and her recommendations.  Monitor condition be to start back vitamin D first to see if she can tolerate the 5000 IUs daily.  And then add in the iron.  If iron is the cause may be switched to a slow release iron        Other   Shortness of breath    Ambiguous in nature.  Nighttime with atypical chest pain.  Tired with movement.  No history of blood clots.  Lungs clear to auscultation today query if this is secondary to anxiety.      Atypical chest pain   Hospital discharge follow-up    Patient was seen in the emergency department.  Did review labs and ED provider note.       No orders of the defined types were placed in this encounter.   Return if symptoms worsen or fail to improve.  Romilda Garret, NP

## 2023-01-24 DIAGNOSIS — Z419 Encounter for procedure for purposes other than remedying health state, unspecified: Secondary | ICD-10-CM | POA: Diagnosis not present

## 2023-02-04 DIAGNOSIS — Z3A20 20 weeks gestation of pregnancy: Secondary | ICD-10-CM | POA: Diagnosis not present

## 2023-02-04 DIAGNOSIS — Z3482 Encounter for supervision of other normal pregnancy, second trimester: Secondary | ICD-10-CM | POA: Diagnosis not present

## 2023-02-04 DIAGNOSIS — O99212 Obesity complicating pregnancy, second trimester: Secondary | ICD-10-CM | POA: Diagnosis not present

## 2023-02-04 DIAGNOSIS — Z363 Encounter for antenatal screening for malformations: Secondary | ICD-10-CM | POA: Diagnosis not present

## 2023-02-24 DIAGNOSIS — Z419 Encounter for procedure for purposes other than remedying health state, unspecified: Secondary | ICD-10-CM | POA: Diagnosis not present

## 2023-03-04 DIAGNOSIS — Z362 Encounter for other antenatal screening follow-up: Secondary | ICD-10-CM | POA: Diagnosis not present

## 2023-03-04 DIAGNOSIS — O0942 Supervision of pregnancy with grand multiparity, second trimester: Secondary | ICD-10-CM | POA: Diagnosis not present

## 2023-03-04 DIAGNOSIS — O132 Gestational [pregnancy-induced] hypertension without significant proteinuria, second trimester: Secondary | ICD-10-CM | POA: Diagnosis not present

## 2023-03-04 DIAGNOSIS — O99212 Obesity complicating pregnancy, second trimester: Secondary | ICD-10-CM | POA: Diagnosis not present

## 2023-03-04 DIAGNOSIS — R002 Palpitations: Secondary | ICD-10-CM | POA: Diagnosis not present

## 2023-03-04 DIAGNOSIS — Z3A24 24 weeks gestation of pregnancy: Secondary | ICD-10-CM | POA: Diagnosis not present

## 2023-03-26 DIAGNOSIS — Z419 Encounter for procedure for purposes other than remedying health state, unspecified: Secondary | ICD-10-CM | POA: Diagnosis not present

## 2023-03-27 DIAGNOSIS — Z3689 Encounter for other specified antenatal screening: Secondary | ICD-10-CM | POA: Diagnosis not present

## 2023-03-27 DIAGNOSIS — Z23 Encounter for immunization: Secondary | ICD-10-CM | POA: Diagnosis not present

## 2023-04-02 DIAGNOSIS — O99810 Abnormal glucose complicating pregnancy: Secondary | ICD-10-CM | POA: Diagnosis not present

## 2023-04-02 DIAGNOSIS — O23599 Infection of other part of genital tract in pregnancy, unspecified trimester: Secondary | ICD-10-CM | POA: Diagnosis not present

## 2023-04-02 DIAGNOSIS — R102 Pelvic and perineal pain: Secondary | ICD-10-CM | POA: Diagnosis not present

## 2023-04-02 DIAGNOSIS — N898 Other specified noninflammatory disorders of vagina: Secondary | ICD-10-CM | POA: Diagnosis not present

## 2023-04-11 ENCOUNTER — Encounter: Payer: Self-pay | Admitting: Cardiology

## 2023-04-11 ENCOUNTER — Ambulatory Visit (INDEPENDENT_AMBULATORY_CARE_PROVIDER_SITE_OTHER): Payer: Medicaid Other | Admitting: Cardiology

## 2023-04-11 VITALS — BP 126/78 | HR 88 | Ht 63.0 in | Wt 243.3 lb

## 2023-04-11 DIAGNOSIS — R0602 Shortness of breath: Secondary | ICD-10-CM

## 2023-04-11 DIAGNOSIS — Z3A31 31 weeks gestation of pregnancy: Secondary | ICD-10-CM

## 2023-04-11 DIAGNOSIS — O99210 Obesity complicating pregnancy, unspecified trimester: Secondary | ICD-10-CM

## 2023-04-11 MED ORDER — BLOOD PRESSURE CUFF MISC: 0 refills | Status: DC

## 2023-04-11 NOTE — Patient Instructions (Signed)
Medication Instructions:  Your physician recommends that you continue on your current medications as directed. Please refer to the Current Medication list given to you today.   Please take your blood pressure daily for 2 weeks and send in a MyChart message. Please include heart rates.   HOW TO TAKE YOUR BLOOD PRESSURE: Rest 5 minutes before taking your blood pressure. Don't smoke or drink caffeinated beverages for at least 30 minutes before. Take your blood pressure before (not after) you eat. Sit comfortably with your back supported and both feet on the floor (don't cross your legs). Elevate your arm to heart level on a table or a desk. Use the proper sized cuff. It should fit smoothly and snugly around your bare upper arm. There should be enough room to slip a fingertip under the cuff. The bottom edge of the cuff should be 1 inch above the crease of the elbow. Ideally, take 3 measurements at one sitting and record the average.  *If you need a refill on your cardiac medications before your next appointment, please call your pharmacy*   Lab Work: None   Testing/Procedures: Your physician has requested that you have an echocardiogram. Echocardiography is a painless test that uses sound waves to create images of your heart. It provides your doctor with information about the size and shape of your heart and how well your heart's chambers and valves are working. This procedure takes approximately one hour. There are no restrictions for this procedure. Please do NOT wear cologne, perfume, aftershave, or lotions (deodorant is allowed). Please arrive 15 minutes prior to your appointment time.    Follow-Up: At [facility], you and your health needs are our priority.  As part of our continuing mission to provide you with exceptional heart care, we have created designated Provider Care Teams.  These Care Teams include your primary Cardiologist (physician) and Advanced Practice Providers  (APPs -  Physician Assistants and Nurse Practitioners) who all work together to provide you with the care you need, when you need it.    Your next appointment:   16 week(s)  Provider:   Thomasene Ripple, DO [address], [place], [place] [address]

## 2023-04-11 NOTE — Progress Notes (Addendum)
Cardio-Obstetrics Clinic  New Evaluation  Date:  04/11/2023   ID:  Kristy Wright, DOB Nov 09, 1991, MRN [id number]  PCP:  Eden Emms, NP   [facility] Providers Cardiologist:  Thomasene Ripple, DO  Electrophysiologist:  None       Referring MD: Lavina Hamman, MD   Chief Complaint: " I am experiencing shortness of breath"  History of Present Illness:    Kristy Wright is a 32 y.o. female [G6P5005] who is being seen today for the evaluation of shortness of breath and palpitations at the request of Meisinger, Todd, MD.   Medica hx includes GERD and hypothyroid here today to be evaluated for worsening shortness of breath on exertion.  She report that she is experiencing worsening shortness of breath on exertion. She is concern because this has never happen with her prior 5 pregnancies.   She is 30 weeks and 6 days pregnant.  Prior CV Studies Reviewed: The following studies were reviewed today:   Past Medical History:  Diagnosis Date   Carpal tunnel syndrome, bilateral    s/p release 05-01-2022   GERD (gastroesophageal reflux disease)    History of acute pyelonephritis    ED visit in epic 04-02-2022   Hypothyroidism    followed by pcp   (last ultrasound in epic 11-11-2018 no nodules)    Past Surgical History:  Procedure Laterality Date   CARPAL TUNNEL RELEASE Right 05/07/2022   Procedure: Right Carpal Tunnel Release;  Surgeon: Gomez Cleverly, MD;  Location: [facility];  Service: Orthopedics;  Laterality: Right;  with local anesthesia   CARPAL TUNNEL RELEASE Left 07/16/2022   Procedure: CARPAL TUNNEL RELEASE;  Surgeon: Gomez Cleverly, MD;  Location: [facility];  Service: Orthopedics;  Laterality: Left;  with local anesthesia      OB History     Gravida  6   Para  5   Term  5   Preterm      AB      Living  5      SAB      IAB      Ectopic      Multiple      Live Births  5               Current  Medications: Current Meds  Medication Sig   Blood Pressure Monitoring (BLOOD PRESSURE CUFF) MISC This prescription is good for one blood pressure monitor with cuff.   levothyroxine (SYNTHROID) 200 MCG tablet Take 1 tablet (200 mcg total) by mouth daily before breakfast.   Prenatal Vit-Fe Fumarate-FA (MULTIVITAMIN-PRENATAL) 27-0.8 MG TABS tablet Take 1 tablet by mouth daily at 12 noon.     Allergies:   Patient has no known allergies.   Social History   Socioeconomic History   Marital status: Married    Spouse name: Not on file   Number of children: 5   Years of education: Not on file   Highest education level: Not on file  Occupational History   Not on file  Tobacco Use   Smoking status: Never   Smokeless tobacco: Never  Vaping Use   Vaping Use: Never used  Substance and Sexual Activity   Alcohol use: Yes    Alcohol/week: 2.0 - 3.0 standard drinks of alcohol    Types: 2 - 3 Cans of beer per week    Comment: beer 2-3 a week   Drug use: No   Sexual activity: Yes    Birth  control/protection: None  Other Topics Concern   Not on file  Social History Narrative   At age 46 began living with her older sister. States her mom was never really involved and she doesn't get along with her father. States they were "crazy parents". Doesn't know their history. She has 3 sons born in 2006, 2007, and 2009. Works at [employer]. Completed 10th grade but did not graduate from high school.         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   Social Determinants of Health   Financial Resource Strain: Not on file  Food Insecurity: Not on file  Transportation Needs: Not on file  Physical Activity: Not on file  Stress: Not on file  Social Connections: Not on file      Family History  Problem Relation Age of Onset   Liver disease Father    Hypertension Father    Diabetes Father    Thyroid disease Sister        uncertain type   Hypertension Sister     Thyroid disease Brother    Healthy Daughter    Healthy Son    Healthy Son    Healthy Son    Healthy Son    Diabetes Maternal Grandmother    Heart disease Maternal Grandmother    Hypertension Maternal Grandmother    Stroke Maternal Grandmother    Cancer Paternal Grandmother        breast   Hypertension Paternal Grandmother    Arthritis Paternal Grandmother    Thyroid disease Paternal Grandmother    Diabetes Paternal Grandfather    Hypertension Paternal Grandfather    Stroke Other       ROS:   Please see the history of present illness.    Shortness of breath and palpitations All other systems reviewed and are negative.   Labs/EKG Reviewed:    EKG:   EKG is was not  ordered today.    Recent Labs: 10/23/2022: ALT 21 01/20/2023: BUN 10; Creatinine, Ser 0.52; Hemoglobin 10.6; Platelets 205; Potassium 3.3; Sodium 137; TSH 2.509   Recent Lipid Panel No results found for: "CHOL", "TRIG", "HDL", "CHOLHDL", "LDLCALC", "LDLDIRECT"  Physical Exam:    VS:  BP 126/78   Pulse 88   Ht 5\' 3"  (1.6 m)   Wt 243 lb 4.8 oz (110.4 kg)   LMP 09/07/2022   SpO2 98%   BMI 43.10 kg/m     Wt Readings from Last 3 Encounters:  04/11/23 243 lb 4.8 oz (110.4 kg)  01/22/23 244 lb 4 oz (110.8 kg)  01/15/23 244 lb 6 oz (110.8 kg)     GEN:  Well nourished, well developed in no acute distress HEENT: Normal NECK: No JVD; No carotid bruits LYMPHATICS: No lymphadenopathy CARDIAC: RRR, no murmurs, rubs, gallops RESPIRATORY:  Clear to auscultation without rales, wheezing or rhonchi  ABDOMEN: Soft, non-tender, non-distended MUSCULOSKELETAL:  No edema; No deformity  SKIN: Warm and dry NEUROLOGIC:  Alert and oriented x 3 PSYCHIATRIC:  Normal affect    Risk Assessment/Risk Calculators:     CARPREG II Risk Prediction Index Score:  1.  The patient's risk for a primary cardiac event is 5%.            ASSESSMENT & PLAN:    Shortness of breath Obesity in pregnancy   Her shortness of  breath is out of proportion will get an echo to assess for any structural abnormalities.    Patient Instructions  Medication Instructions:  Your physician recommends that you continue on your current medications as directed. Please refer to the Current Medication list given to you today.   Please take your blood pressure daily for 2 weeks and send in a MyChart message. Please include heart rates.   HOW TO TAKE YOUR BLOOD PRESSURE: Rest 5 minutes before taking your blood pressure. Don't smoke or drink caffeinated beverages for at least 30 minutes before. Take your blood pressure before (not after) you eat. Sit comfortably with your back supported and both feet on the floor (don't cross your legs). Elevate your arm to heart level on a table or a desk. Use the proper sized cuff. It should fit smoothly and snugly around your bare upper arm. There should be enough room to slip a fingertip under the cuff. The bottom edge of the cuff should be 1 inch above the crease of the elbow. Ideally, take 3 measurements at one sitting and record the average.  *If you need a refill on your cardiac medications before your next appointment, please call your pharmacy*   Lab Work: None   Testing/Procedures: Your physician has requested that you have an echocardiogram. Echocardiography is a painless test that uses sound waves to create images of your heart. It provides your doctor with information about the size and shape of your heart and how well your heart's chambers and valves are working. This procedure takes approximately one hour. There are no restrictions for this procedure. Please do NOT wear cologne, perfume, aftershave, or lotions (deodorant is allowed). Please arrive 15 minutes prior to your appointment time.    Follow-Up: At [facility], you and your health needs are our priority.  As part of our continuing mission to provide you with exceptional heart care, we have created designated  Provider Care Teams.  These Care Teams include your primary Cardiologist (physician) and Advanced Practice Providers (APPs -  Physician Assistants and Nurse Practitioners) who all work together to provide you with the care you need, when you need it.    Your next appointment:   16 week(s)  Provider:   Thomasene Ripple, DO [address], [place], [place] [address]     Dispo:  No follow-ups on file.   Medication Adjustments/Labs and Tests Ordered: Current medicines are reviewed at length with the patient today.  Concerns regarding medicines are outlined above.  Tests Ordered: Orders Placed This Encounter  Procedures   ECHOCARDIOGRAM COMPLETE   Medication Changes: Meds ordered this encounter  Medications   Blood Pressure Monitoring (BLOOD PRESSURE CUFF) MISC    Sig: This prescription is good for one blood pressure monitor with cuff.    Dispense:  1 each    Refill:  0

## 2023-04-23 DIAGNOSIS — R7309 Other abnormal glucose: Secondary | ICD-10-CM | POA: Diagnosis not present

## 2023-04-26 DIAGNOSIS — Z419 Encounter for procedure for purposes other than remedying health state, unspecified: Secondary | ICD-10-CM | POA: Diagnosis not present

## 2023-04-30 ENCOUNTER — Inpatient Hospital Stay (HOSPITAL_COMMUNITY)
Admission: AD | Admit: 2023-04-30 | Discharge: 2023-04-30 | Disposition: A | Discharge status: Home / Self Care | Payer: Medicaid Other | Attending: Obstetrics and Gynecology | Admitting: Obstetrics and Gynecology

## 2023-04-30 ENCOUNTER — Encounter (HOSPITAL_COMMUNITY): Payer: Self-pay | Admitting: Obstetrics and Gynecology

## 2023-04-30 ENCOUNTER — Encounter: Payer: Self-pay | Admitting: Cardiology

## 2023-04-30 DIAGNOSIS — O99613 Diseases of the digestive system complicating pregnancy, third trimester: Secondary | ICD-10-CM | POA: Insufficient documentation

## 2023-04-30 DIAGNOSIS — Z3A33 33 weeks gestation of pregnancy: Secondary | ICD-10-CM | POA: Diagnosis not present

## 2023-04-30 DIAGNOSIS — O26893 Other specified pregnancy related conditions, third trimester: Secondary | ICD-10-CM | POA: Insufficient documentation

## 2023-04-30 DIAGNOSIS — O99283 Endocrine, nutritional and metabolic diseases complicating pregnancy, third trimester: Secondary | ICD-10-CM | POA: Insufficient documentation

## 2023-04-30 DIAGNOSIS — R42 Dizziness and giddiness: Secondary | ICD-10-CM

## 2023-04-30 LAB — COMPREHENSIVE METABOLIC PANEL
ALT: 17 U/L (ref 0–44)
AST: 19 U/L (ref 15–41)
Albumin: 2.8 g/dL — ABNORMAL LOW (ref 3.5–5.0)
Alkaline Phosphatase: 79 U/L (ref 38–126)
Anion gap: 10 (ref 5–15)
BUN: 10 mg/dL (ref 6–20)
CO2: 22 mmol/L (ref 22–32)
Calcium: 9.3 mg/dL (ref 8.9–10.3)
Chloride: 104 mmol/L (ref 98–111)
Creatinine, Ser: 0.61 mg/dL (ref 0.44–1.00)
GFR, Estimated: >60 mL/min (ref >60)
Glucose, Bld: 94 mg/dL (ref 70–99)
Potassium: 3.7 mmol/L (ref 3.5–5.1)
Sodium: 136 mmol/L (ref 135–145)
Total Bilirubin: 0.6 mg/dL (ref 0.3–1.2)
Total Protein: 6.4 g/dL — ABNORMAL LOW (ref 6.5–8.1)

## 2023-04-30 LAB — URINALYSIS, ROUTINE W REFLEX MICROSCOPIC
Bilirubin Urine: NEGATIVE
Glucose, UA: NEGATIVE mg/dL
Hgb urine dipstick: NEGATIVE
Ketones, ur: NEGATIVE mg/dL
Nitrite: NEGATIVE
Protein, ur: NEGATIVE mg/dL
Specific Gravity, Urine: 1.025 (ref 1.005–1.030)
pH: 6 (ref 5.0–8.0)

## 2023-04-30 LAB — CBC WITH DIFFERENTIAL/PLATELET
Abs Immature Granulocytes: 0.04 10*3/uL (ref 0.00–0.07)
Basophils Absolute: 0 10*3/uL (ref 0.0–0.1)
Basophils Relative: 0 %
Eosinophils Absolute: 0.1 10*3/uL (ref 0.0–0.5)
Eosinophils Relative: 1 %
HCT: 34.2 % — ABNORMAL LOW (ref 36.0–46.0)
Hemoglobin: 11.3 g/dL — ABNORMAL LOW (ref 12.0–15.0)
Immature Granulocytes: 1 %
Lymphocytes Relative: 26 %
Lymphs Abs: 2.2 10*3/uL (ref 0.7–4.0)
MCH: 30.5 pg (ref 26.0–34.0)
MCHC: 33 g/dL (ref 30.0–36.0)
MCV: 92.2 fL (ref 80.0–100.0)
Monocytes Absolute: 0.6 10*3/uL (ref 0.1–1.0)
Monocytes Relative: 8 %
Neutro Abs: 5.3 10*3/uL (ref 1.7–7.7)
Neutrophils Relative %: 64 %
Platelets: 213 10*3/uL (ref 150–400)
RBC: 3.71 MIL/uL — ABNORMAL LOW (ref 3.87–5.11)
RDW: 14.1 % (ref 11.5–15.5)
WBC: 8.2 10*3/uL (ref 4.0–10.5)
nRBC: 0 % (ref 0.0–0.2)

## 2023-04-30 LAB — WET PREP, GENITAL
Clue Cells Wet Prep HPF POC: NONE SEEN
Sperm: NONE SEEN
Trich, Wet Prep: NONE SEEN
WBC, Wet Prep HPF POC: >=10 — AB (ref <10)
Yeast Wet Prep HPF POC: NONE SEEN

## 2023-04-30 MED ORDER — SODIUM CHLORIDE 0.9 % IV SOLN: Freq: Once | INTRAVENOUS | Status: AC

## 2023-04-30 MED ORDER — MECLIZINE HCL 25 MG PO TABS
25.0000 mg | ORAL_TABLET | Freq: Once | ORAL | Status: AC
  Administered 2023-04-30: 25 mg via ORAL
  Filled 2023-04-30: qty 1

## 2023-04-30 MED ORDER — SCOPOLAMINE 1 MG/3DAYS TD PT72
1.0000 | MEDICATED_PATCH | Freq: Once | TRANSDERMAL | Status: DC
  Administered 2023-04-30: 1.5 mg via TRANSDERMAL
  Filled 2023-04-30: qty 1

## 2023-04-30 MED ORDER — MECLIZINE HCL 25 MG PO TABS: 25.0000 mg | ORAL_TABLET | Freq: Three times a day (TID) | ORAL | 0 refills | Status: DC | PRN

## 2023-04-30 NOTE — MAU Provider Note (Addendum)
History     CSN: [id number]  Arrival date and time: 04/30/23 1741   Event Date/Time   First Provider Initiated Contact with Patient 04/30/23 1832      Chief Complaint  Patient presents with   Dizziness   Kristy Wright , a  32 y.o. [id number] at [redacted]w[redacted]d presents to MAU with complaints of feeling lightheaded and dizzy since this morning. Patient states that she got up this morning and started feeling increased pelvic pressure and when she stood up she noted feeling flushed, nauseated, and the "room started spinning feeling like she is going to faint or pass out." She denies syncopal episodes and endorses being seen by Cardio. She states that this has happened earlier in pregnancy, but "not this bad." She denies shortness of breath, difficulty breathing.  She states that she tried laying back down but symptoms are worsened by laying down. She states that she attempted to eat and increase oral hydration with minimal relief. Diet recall today, steak chicken, fruit, a sandwich water and juice. She states that symptoms also worsen with increased pelvic pressure. Describes pressure as intermittent and currently rates pain a 5/10. She denies abnormal vaginal discharge but endorses pressure with urination. She denies vaginal bleeding, leaking of fluid and contractions, but endorses positive fetal movement.   Dizziness Associated symptoms include nausea. Pertinent negatives include no abdominal pain, chest pain, chills, fatigue, fever, headaches, numbness, vomiting or weakness.    OB History     Gravida  6   Para  5   Term  5   Preterm      AB      Living  5      SAB      IAB      Ectopic      Multiple      Live Births  5           Past Medical History:  Diagnosis Date   Carpal tunnel syndrome, bilateral    s/p release 05-01-2022   GERD (gastroesophageal reflux disease)    History of acute pyelonephritis    ED visit in epic 04-02-2022   Hypothyroidism    followed by pcp    (last ultrasound in epic 11-11-2018 no nodules)    Past Surgical History:  Procedure Laterality Date   CARPAL TUNNEL RELEASE Right 05/07/2022   Procedure: Right Carpal Tunnel Release;  Surgeon: Gomez Cleverly, MD;  [facility];  Service: Orthopedics;  Laterality: Right;  with local anesthesia   CARPAL TUNNEL RELEASE Left 07/16/2022   Procedure: CARPAL TUNNEL RELEASE;  Surgeon: Gomez Cleverly, MD;  Location: [facility];  Service: Orthopedics;  Laterality: Left;  with local anesthesia    Family History  Problem Relation Age of Onset   Liver disease Father    Hypertension Father    Diabetes Father    Thyroid disease Sister        uncertain type   Hypertension Sister    Thyroid disease Brother    Healthy Daughter    Healthy Son    Healthy Son    Healthy Son    Healthy Son    Diabetes Maternal Grandmother    Heart disease Maternal Grandmother    Hypertension Maternal Grandmother    Stroke Maternal Grandmother    Cancer Paternal Grandmother        breast   Hypertension Paternal Grandmother    Arthritis Paternal Grandmother    Thyroid disease Paternal Grandmother  Diabetes Paternal Grandfather    Hypertension Paternal Grandfather    Stroke Other     Social History   Tobacco Use   Smoking status: Never   Smokeless tobacco: Never  Vaping Use   Vaping Use: Never used  Substance Use Topics   Alcohol use: Yes    Alcohol/week: 2.0 - 3.0 standard drinks of alcohol    Types: 2 - 3 Cans of beer per week    Comment: beer 2-3 a week   Drug use: No    Allergies: No Known Allergies  Medications Prior to Admission  Medication Sig Dispense Refill Last Dose   levothyroxine (SYNTHROID) 200 MCG tablet Take 1 tablet (200 mcg total) by mouth daily before breakfast. 90 tablet 1 04/30/2023   Prenatal Vit-Fe Fumarate-FA (MULTIVITAMIN-PRENATAL) 27-0.8 MG TABS tablet Take 1 tablet by mouth daily at 12 noon.   04/29/2023   Blood Pressure Monitoring  (BLOOD PRESSURE CUFF) MISC This prescription is good for one blood pressure monitor with cuff. 1 each 0     Review of Systems  Constitutional:  Negative for chills, fatigue and fever.  Eyes:  Negative for pain and visual disturbance.  Respiratory:  Negative for apnea, shortness of breath and wheezing.   Cardiovascular:  Negative for chest pain and palpitations.  Gastrointestinal:  Positive for nausea. Negative for abdominal pain, constipation, diarrhea and vomiting.  Genitourinary:  Positive for pelvic pain. Negative for difficulty urinating, dysuria, vaginal bleeding, vaginal discharge and vaginal pain.  Musculoskeletal:  Negative for back pain.  Skin:  Positive for color change.       Flushed   Neurological:  Positive for dizziness and light-headedness. Negative for tremors, seizures, syncope, weakness, numbness and headaches.  Psychiatric/Behavioral:  Negative for suicidal ideas.    Physical Exam   Blood pressure 120/73, pulse 93, temperature 98.1 F (36.7 C), resp. rate 18, height 5\' 3"  (1.6 m), weight 109.3 kg, last menstrual period 09/07/2022, SpO2 97 %.  Physical Exam Vitals and nursing note reviewed.  Constitutional:      General: She is not in acute distress.    Appearance: Normal appearance.  HENT:     Head: Normocephalic.  Eyes:     Extraocular Movements: Extraocular movements intact.     Pupils: Pupils are equal, round, and reactive to light.  Cardiovascular:     Rate and Rhythm: Normal rate and regular rhythm.     Pulses: Normal pulses.     Heart sounds: Normal heart sounds.  Pulmonary:     Effort: Pulmonary effort is normal.     Breath sounds: Normal breath sounds.  Abdominal:     Palpations: Abdomen is soft.  Musculoskeletal:        General: Normal range of motion.     Cervical back: Normal range of motion.  Skin:    General: Skin is warm and dry.     Capillary Refill: Capillary refill takes 2 to 3 seconds.  Neurological:     Mental Status: She is alert  and oriented to person, place, and time.  Psychiatric:        Mood and Affect: Mood normal.    FHT: 135 bpm with moderate variability. Accels present. No decels noted.  - Toco: occasional UI   Reassessment @ 2040: Patient states, "I'm feeling a little better." MAU Course  Procedures Orders Placed This Encounter  Procedures   Wet prep, genital   Urinalysis, Routine w reflex microscopic -Urine, Clean Catch   CBC with Differential/Platelet   Comprehensive  metabolic panel   Orthostatic vital signs   ED EKG   Insert peripheral IV    MDM - UA positive for bacteria and leuks. UA reflexed to culture to R/O UTI in the presence of pelvic pain.  - Consulted Dr. Cherlynn Polo- Lott on EKG results, Normal Sinus Rhythm Low voltage QRS. Per MD EKG not overtly concerning for infarct.   Report given to Raelyn Mora, CNM @ [address] Noreene Larsson 04/30/2023, 6:33 PM   Results for orders placed or performed during the hospital encounter of 04/30/23 (from the past 24 hour(s))  Urinalysis, Routine w reflex microscopic -Urine, Clean Catch     Status: Abnormal   Collection Time: 04/30/23  6:19 PM  Result Value Ref Range   Color, Urine YELLOW YELLOW   APPearance HAZY (A) CLEAR   Specific Gravity, Urine 1.025 1.005 - 1.030   pH 6.0 5.0 - 8.0   Glucose, UA NEGATIVE NEGATIVE mg/dL   Hgb urine dipstick NEGATIVE NEGATIVE   Bilirubin Urine NEGATIVE NEGATIVE   Ketones, ur NEGATIVE NEGATIVE mg/dL   Protein, ur NEGATIVE NEGATIVE mg/dL   Nitrite NEGATIVE NEGATIVE   Leukocytes,Ua SMALL (A) NEGATIVE   RBC / HPF 0-5 0 - 5 RBC/hpf   WBC, UA 0-5 0 - 5 WBC/hpf   Bacteria, UA RARE (A) NONE SEEN   Squamous Epithelial / HPF 11-20 0 - 5 /HPF   Mucus PRESENT   CBC with Differential/Platelet     Status: Abnormal   Collection Time: 04/30/23  7:11 PM  Result Value Ref Range   WBC 8.2 4.0 - 10.5 K/uL   RBC 3.71 (L) 3.87 - 5.11 MIL/uL   Hemoglobin 11.3 (L) 12.0 - 15.0 g/dL   HCT 91.4 (L) 78.2 - 95.6 %   MCV 92.2 80.0 -  100.0 fL   MCH 30.5 26.0 - 34.0 pg   MCHC 33.0 30.0 - 36.0 g/dL   RDW 21.3 08.6 - 57.8 %   Platelets 213 150 - 400 K/uL   nRBC 0.0 0.0 - 0.2 %   Neutrophils Relative % 64 %   Neutro Abs 5.3 1.7 - 7.7 K/uL   Lymphocytes Relative 26 %   Lymphs Abs 2.2 0.7 - 4.0 K/uL   Monocytes Relative 8 %   Monocytes Absolute 0.6 0.1 - 1.0 K/uL   Eosinophils Relative 1 %   Eosinophils Absolute 0.1 0.0 - 0.5 K/uL   Basophils Relative 0 %   Basophils Absolute 0.0 0.0 - 0.1 K/uL   Immature Granulocytes 1 %   Abs Immature Granulocytes 0.04 0.00 - 0.07 K/uL  Comprehensive metabolic panel     Status: Abnormal   Collection Time: 04/30/23  7:11 PM  Result Value Ref Range   Sodium 136 135 - 145 mmol/L   Potassium 3.7 3.5 - 5.1 mmol/L   Chloride 104 98 - 111 mmol/L   CO2 22 22 - 32 mmol/L   Glucose, Bld 94 70 - 99 mg/dL   BUN 10 6 - 20 mg/dL   Creatinine, Ser 4.69 0.44 - 1.00 mg/dL   Calcium 9.3 8.9 - 62.9 mg/dL   Total Protein 6.4 (L) 6.5 - 8.1 g/dL   Albumin 2.8 (L) 3.5 - 5.0 g/dL   AST 19 15 - 41 U/L   ALT 17 0 - 44 U/L   Alkaline Phosphatase 79 38 - 126 U/L   Total Bilirubin 0.6 0.3 - 1.2 mg/dL   GFR, Estimated >52 >84 mL/min   Anion gap 10 5 - 15  Wet  prep, genital     Status: Abnormal   Collection Time: 04/30/23  7:15 PM   Specimen: Vaginal  Result Value Ref Range   Yeast Wet Prep HPF POC NONE SEEN NONE SEEN   Trich, Wet Prep NONE SEEN NONE SEEN   Clue Cells Wet Prep HPF POC NONE SEEN NONE SEEN   WBC, Wet Prep HPF POC >=10 (A) <10   Sperm NONE SEEN     Assessment and Plan  1. Dizziness - Information provided on dizziness - Rx: Meclizine 25 mg po TID prn dizziness   2. [redacted] weeks gestation of pregnancy   - Discharge patient - Keep scheduled appt with [facility] - Patient verbalized an understanding of the plan of care and agrees.   Raelyn Mora, CNM  04/30/2023 8:48 PM

## 2023-04-30 NOTE — Telephone Encounter (Signed)
Pt's blood pressure is WNL- taken when sitting.  She reports that she has had light-headedness, dizziness when standing and walking around. Also reports some pressure in vaginal/perineum area.   She reports seeing some "specks/floaters" mostly when she stands up and is walking around. No swelling  She has been hydrating more after speaking with the OB, she is feeling the baby move well.   She feels the same as before even after hydrating.   She had a sandwich and she has eaten a plum and an orange. Been drinking plenty of water. Reports that she has not been outside sweating and has not been very active today. She has been inside.    Asked pt to check her BP after standing up= 114/74 hr 89.  Told her that I would give this information to Dr Servando Salina and get back to her with any recommendations.

## 2023-04-30 NOTE — MAU Note (Signed)
.  Kristy Wright is a 32 y.o. at [redacted]w[redacted]d here in MAU reporting: has been feeling lgitheaded and dizzy all day. Has tired to rest and increased fluid intake. Has eaten todaya sw ell and still does not feel any better. Aslo reports she has been feeling pelvic pressure on and off all day. Good fetal movement reported.denies any vag bleeding or leaking at this time.  LMP:  Onset of complaint: this morning Pain score: 5-6 Vitals:   04/30/23 1757  BP: 117/72  Pulse: 89  Resp: 18  Temp: 98.1 F (36.7 C)     FHT:135 Lab orders placed from triage:  u/a

## 2023-05-01 LAB — CULTURE, OB URINE: Culture: <10000 — AB

## 2023-05-16 ENCOUNTER — Ambulatory Visit (HOSPITAL_COMMUNITY): Payer: Medicaid Other | Attending: Cardiology

## 2023-05-16 ENCOUNTER — Encounter (HOSPITAL_COMMUNITY): Payer: Self-pay | Admitting: Cardiology

## 2023-05-21 DIAGNOSIS — Z3482 Encounter for supervision of other normal pregnancy, second trimester: Secondary | ICD-10-CM | POA: Diagnosis not present

## 2023-05-21 DIAGNOSIS — Z3483 Encounter for supervision of other normal pregnancy, third trimester: Secondary | ICD-10-CM | POA: Diagnosis not present

## 2023-05-26 DIAGNOSIS — Z419 Encounter for procedure for purposes other than remedying health state, unspecified: Secondary | ICD-10-CM | POA: Diagnosis not present

## 2023-05-28 DIAGNOSIS — Z3685 Encounter for antenatal screening for Streptococcus B: Secondary | ICD-10-CM | POA: Diagnosis not present

## 2023-06-01 LAB — OB RESULTS CONSOLE GBS: GBS: POSITIVE

## 2023-06-12 ENCOUNTER — Telehealth (HOSPITAL_COMMUNITY): Payer: Self-pay | Admitting: *Deleted

## 2023-06-12 ENCOUNTER — Encounter (HOSPITAL_COMMUNITY): Payer: Self-pay | Admitting: *Deleted

## 2023-06-12 NOTE — Telephone Encounter (Signed)
Preadmission screen  

## 2023-06-14 ENCOUNTER — Other Ambulatory Visit: Payer: Self-pay | Admitting: Obstetrics and Gynecology

## 2023-06-14 DIAGNOSIS — Z3A39 39 weeks gestation of pregnancy: Secondary | ICD-10-CM

## 2023-06-15 ENCOUNTER — Inpatient Hospital Stay (HOSPITAL_COMMUNITY): Payer: Medicaid Other | Admitting: Anesthesiology

## 2023-06-15 ENCOUNTER — Inpatient Hospital Stay (HOSPITAL_COMMUNITY): Payer: Medicaid Other

## 2023-06-15 ENCOUNTER — Inpatient Hospital Stay (HOSPITAL_COMMUNITY)
Admission: RE | Admit: 2023-06-15 | Discharge: 2023-06-17 | DRG: 806 | Disposition: A | Discharge status: Home / Self Care | Payer: Medicaid Other | Attending: Obstetrics and Gynecology | Admitting: Obstetrics and Gynecology

## 2023-06-15 ENCOUNTER — Encounter (HOSPITAL_COMMUNITY): Payer: Self-pay | Admitting: Obstetrics and Gynecology

## 2023-06-15 DIAGNOSIS — O99824 Streptococcus B carrier state complicating childbirth: Secondary | ICD-10-CM | POA: Diagnosis not present

## 2023-06-15 DIAGNOSIS — Z3A40 40 weeks gestation of pregnancy: Secondary | ICD-10-CM

## 2023-06-15 DIAGNOSIS — E039 Hypothyroidism, unspecified: Secondary | ICD-10-CM | POA: Diagnosis not present

## 2023-06-15 DIAGNOSIS — O358XX0 Maternal care for other (suspected) fetal abnormality and damage, not applicable or unspecified: Secondary | ICD-10-CM | POA: Diagnosis present

## 2023-06-15 DIAGNOSIS — Z3A39 39 weeks gestation of pregnancy: Principal | ICD-10-CM

## 2023-06-15 DIAGNOSIS — O480 Post-term pregnancy: Secondary | ICD-10-CM | POA: Diagnosis present

## 2023-06-15 DIAGNOSIS — O9081 Anemia of the puerperium: Secondary | ICD-10-CM | POA: Diagnosis not present

## 2023-06-15 DIAGNOSIS — O99214 Obesity complicating childbirth: Secondary | ICD-10-CM | POA: Diagnosis not present

## 2023-06-15 DIAGNOSIS — D62 Acute posthemorrhagic anemia: Secondary | ICD-10-CM | POA: Diagnosis not present

## 2023-06-15 DIAGNOSIS — O26893 Other specified pregnancy related conditions, third trimester: Secondary | ICD-10-CM | POA: Diagnosis not present

## 2023-06-15 DIAGNOSIS — O99284 Endocrine, nutritional and metabolic diseases complicating childbirth: Secondary | ICD-10-CM | POA: Diagnosis not present

## 2023-06-15 LAB — CBC
HCT: 32.7 % — ABNORMAL LOW (ref 36.0–46.0)
Hemoglobin: 10.7 g/dL — ABNORMAL LOW (ref 12.0–15.0)
MCH: 29.3 pg (ref 26.0–34.0)
MCHC: 32.7 g/dL (ref 30.0–36.0)
MCV: 89.6 fL (ref 80.0–100.0)
Platelets: 191 10*3/uL (ref 150–400)
RBC: 3.65 MIL/uL — ABNORMAL LOW (ref 3.87–5.11)
RDW: 13.9 % (ref 11.5–15.5)
WBC: 7.7 10*3/uL (ref 4.0–10.5)
nRBC: 0 % (ref 0.0–0.2)

## 2023-06-15 LAB — TYPE AND SCREEN
ABO/RH(D): A POS
Antibody Screen: NEGATIVE

## 2023-06-15 LAB — RPR: RPR Ser Ql: NONREACTIVE

## 2023-06-15 MED ORDER — OXYTOCIN-SODIUM CHLORIDE 30-0.9 UT/500ML-% IV SOLN: 2.5000 [IU]/h | INTRAVENOUS | Status: DC

## 2023-06-15 MED ORDER — OXYTOCIN-SODIUM CHLORIDE 30-0.9 UT/500ML-% IV SOLN
1.0000 m[IU]/min | INTRAVENOUS | Status: DC
  Administered 2023-06-15: 2 m[IU]/min via INTRAVENOUS
  Administered 2023-06-15: 24 m[IU]/min via INTRAVENOUS
  Filled 2023-06-15 (×2): qty 500

## 2023-06-15 MED ORDER — PENICILLIN G POT IN DEXTROSE 60000 UNIT/ML IV SOLN
3.0000 10*6.[IU] | INTRAVENOUS | Status: DC
  Administered 2023-06-15 (×3): 3 10*6.[IU] via INTRAVENOUS
  Filled 2023-06-15 (×4): qty 50

## 2023-06-15 MED ORDER — TRANEXAMIC ACID-NACL 1000-0.7 MG/100ML-% IV SOLN
INTRAVENOUS | Status: AC
  Administered 2023-06-15: 10 mg
  Filled 2023-06-15: qty 100

## 2023-06-15 MED ORDER — DIPHENHYDRAMINE HCL 50 MG/ML IJ SOLN: 12.5000 mg | INTRAMUSCULAR | Status: DC | PRN

## 2023-06-15 MED ORDER — FENTANYL-BUPIVACAINE-NACL 0.5-0.125-0.9 MG/250ML-% EP SOLN: 12.0000 mL/h | EPIDURAL | Status: DC | PRN

## 2023-06-15 MED ORDER — TERBUTALINE SULFATE 1 MG/ML IJ SOLN: 0.2500 mg | Freq: Once | INTRAMUSCULAR | Status: DC | PRN

## 2023-06-15 MED ORDER — LIDOCAINE HCL (PF) 1 % IJ SOLN
INTRAMUSCULAR | Status: DC | PRN
  Administered 2023-06-15: 8 mL via EPIDURAL

## 2023-06-15 MED ORDER — SODIUM CHLORIDE 0.9 % IV SOLN
5.0000 10*6.[IU] | Freq: Once | INTRAVENOUS | Status: AC
  Administered 2023-06-15: 5 10*6.[IU] via INTRAVENOUS
  Filled 2023-06-15: qty 5

## 2023-06-15 MED ORDER — LACTATED RINGERS IV SOLN
500.0000 mL | Freq: Once | INTRAVENOUS | Status: AC
  Administered 2023-06-15: 500 mL via INTRAVENOUS

## 2023-06-15 MED ORDER — OXYTOCIN BOLUS FROM INFUSION: 333.0000 mL | Freq: Once | INTRAVENOUS | Status: DC

## 2023-06-15 MED ORDER — OXYCODONE-ACETAMINOPHEN 5-325 MG PO TABS: 1.0000 | ORAL_TABLET | ORAL | Status: DC | PRN

## 2023-06-15 MED ORDER — EPHEDRINE 5 MG/ML INJ: 10.0000 mg | INTRAVENOUS | Status: DC | PRN

## 2023-06-15 MED ORDER — PHENYLEPHRINE 80 MCG/ML (10ML) SYRINGE FOR IV PUSH (FOR BLOOD PRESSURE SUPPORT): 80.0000 ug | PREFILLED_SYRINGE | INTRAVENOUS | Status: DC | PRN

## 2023-06-15 MED ORDER — PHENYLEPHRINE 80 MCG/ML (10ML) SYRINGE FOR IV PUSH (FOR BLOOD PRESSURE SUPPORT)
80.0000 ug | PREFILLED_SYRINGE | INTRAVENOUS | Status: DC | PRN
  Filled 2023-06-15: qty 10

## 2023-06-15 MED ORDER — TRANEXAMIC ACID-NACL 1000-0.7 MG/100ML-% IV SOLN: 1000.0000 mg | INTRAVENOUS | Status: AC

## 2023-06-15 MED ORDER — OXYCODONE-ACETAMINOPHEN 5-325 MG PO TABS: 2.0000 | ORAL_TABLET | ORAL | Status: DC | PRN

## 2023-06-15 MED ORDER — FENTANYL-BUPIVACAINE-NACL 0.5-0.125-0.9 MG/250ML-% EP SOLN
12.0000 mL/h | EPIDURAL | Status: DC | PRN
  Administered 2023-06-15: 12 mL/h via EPIDURAL
  Filled 2023-06-15: qty 250

## 2023-06-15 MED ORDER — FENTANYL CITRATE (PF) 100 MCG/2ML IJ SOLN
100.0000 ug | INTRAMUSCULAR | Status: DC | PRN
  Administered 2023-06-15: 100 ug via INTRAVENOUS
  Filled 2023-06-15: qty 2

## 2023-06-15 MED ORDER — LACTATED RINGERS IV SOLN: INTRAVENOUS | Status: DC

## 2023-06-15 MED ORDER — ACETAMINOPHEN 325 MG PO TABS: 650.0000 mg | ORAL_TABLET | ORAL | Status: DC | PRN

## 2023-06-15 MED ORDER — ONDANSETRON HCL 4 MG/2ML IJ SOLN
4.0000 mg | Freq: Four times a day (QID) | INTRAMUSCULAR | Status: DC | PRN
  Administered 2023-06-15: 4 mg via INTRAVENOUS
  Filled 2023-06-15: qty 2

## 2023-06-15 MED ORDER — SOD CITRATE-CITRIC ACID 500-334 MG/5ML PO SOLN: 30.0000 mL | ORAL | Status: DC | PRN

## 2023-06-15 MED ORDER — LACTATED RINGERS IV SOLN: 500.0000 mL | INTRAVENOUS | Status: DC | PRN

## 2023-06-15 MED ORDER — LIDOCAINE HCL (PF) 1 % IJ SOLN: 30.0000 mL | INTRAMUSCULAR | Status: DC | PRN

## 2023-06-15 NOTE — Progress Notes (Signed)
Labor Note  S: comfortable from epidural  O: BP (!) 102/55   Pulse 89   Temp 97.7 F (36.5 C) (Axillary)   Resp 16   Ht 5\' 1"  (1.549 m)   Wt 111.1 kg   LMP 09/07/2022   SpO2 99%   BMI 46.27 kg/m  CE: 5-6/90/0 FHR: Baseline 145, occaccels, + early decels, min to mod variability TOCO q11m, pitocin at 5mU/min, MVU180  A/P: This is a 32 y.o. [id number] at [redacted]w[redacted]d  admitted for IOL at term given BMI 46. GBS pos FWB: Cat 1 tracing  MWB: comfortable s/p epidural Labor course: now transitioning into active labor after epidural placement, s/p SROM @ 1000. May titrate pitocin to max 30 mU/min for MVU 200s. Continue PCN for GBS ppx  Anticipate SVD

## 2023-06-15 NOTE — Progress Notes (Signed)
Pt up in bathroom  

## 2023-06-15 NOTE — Lactation Note (Signed)
This note was copied from a baby's chart. Lactation Consultation Note  Patient Name: Kristy Wright [id number] Date: 06/15/2023 Age:34 hours  Birth Parent declined [facility] services for L&D.    Maternal Data    Feeding    LATCH Score Latch: Repeated attempts needed to sustain latch, nipple held in mouth throughout feeding, stimulation needed to elicit sucking reflex.  Audible Swallowing: Spontaneous and intermittent  Type of Nipple: Everted at rest and after stimulation  Comfort (Breast/Nipple): Soft / non-tender  Hold (Positioning): No assistance needed to correctly position infant at breast.  LATCH Score: 9   Lactation Tools Discussed/Used    Interventions    Discharge    Consult Status      Kristy Wright 06/15/2023, 11:20 PM

## 2023-06-15 NOTE — Progress Notes (Signed)
CE 3/50/-2,pitocin at 8Mu/min, some pain with contractions, continues to leak. Continue to titrate BP 113/65   Pulse 80   Temp 97.6 F (36.4 C) (Oral)   Resp 19   Ht 5\' 1"  (1.549 m)   Wt 111.1 kg   LMP 09/07/2022   BMI 46.27 kg/m

## 2023-06-15 NOTE — Progress Notes (Signed)
Accidential AROM with attempted insertion of foley bulb

## 2023-06-15 NOTE — H&P (Signed)
Kristy Wright is a 32 y.o. female presenting for scheduled IOL. +FM, denies VB, LOF, only irr ctx.  PNC cb/  1) Grand multip - [id number] 2) BMI 46 with isolated CP cyst  - baby Asa, NIPT low risk, XY. 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 4) Dyspnea - S/p cardiology eval on 5/17, no show for Echo on 6/21 GBS pos, NKDA  One-time elvated BP at 24wks with normal PreE labs, Asymptomatic this AM OB History     Gravida  6   Para  5   Term  5   Preterm      AB      Living  5      SAB      IAB      Ectopic      Multiple      Live Births  5          Past Medical History:  Diagnosis Date   Carpal tunnel syndrome, bilateral    s/p release 05-01-2022   GERD (gastroesophageal reflux disease)    History of acute pyelonephritis    ED visit in epic 04-02-2022   Hypothyroidism    followed by pcp   (last ultrasound in epic 11-11-2018 no nodules)   Past Surgical History:  Procedure Laterality Date   CARPAL TUNNEL RELEASE Right 05/07/2022   Procedure: Right Carpal Tunnel Release;  Surgeon: Gomez Cleverly, MD;  Location: [facility];  Service: Orthopedics;  Laterality: Right;  with local anesthesia   CARPAL TUNNEL RELEASE Left 07/16/2022   Procedure: CARPAL TUNNEL RELEASE;  Surgeon: Gomez Cleverly, MD;  Location: [facility] [place];  Service: Orthopedics;  Laterality: Left;  with local anesthesia   Family History: family history includes Arthritis in her paternal grandmother; Cancer in her paternal grandmother; Diabetes in her father, maternal grandmother, and paternal grandfather; Healthy in her daughter, son, son, son, and son; Heart disease in her maternal grandmother; Hypertension in her father, maternal grandmother, paternal grandfather, paternal grandmother, and sister; Liver disease in her father; Stroke in her maternal grandmother and another family member; Thyroid disease in her brother, paternal grandmother, and  sister. Social History:  reports that she has never smoked. She has never used smokeless tobacco. She reports current alcohol use of about 2.0 - 3.0 standard drinks of alcohol per week. She reports that she does not use drugs.     Maternal Diabetes: No1hr 143, 3hr WNL Genetic Screening: Normal Maternal Ultrasounds/Referrals: Isolated choroid plexus cyst Fetal Ultrasounds or other Referrals:  None Maternal Substance Abuse:  No Significant Maternal Medications:  None Significant Maternal Lab Results:  Group B Strep positive Number of Prenatal Visits:greater than 3 verified prenatal visits Other Comments:  None  Review of Systems  Constitutional:  Negative for chills and fever.  Respiratory:  Negative for shortness of breath.   Cardiovascular:  Negative for chest pain, palpitations and leg swelling.  Gastrointestinal:  Negative for abdominal pain and vomiting.  Neurological:  Negative for dizziness, weakness and headaches.  Psychiatric/Behavioral:  Negative for suicidal ideas.    Maternal Medical History:  Contractions: Frequency: rare.   Fetal activity: Perceived fetal activity is normal.   Prenatal complications: No infection or IUGR.   Prenatal Complications - Diabetes: none.   Dilation: 1 Effacement (%): Thick Station: -2 Exam by:: S Grindstaff RN Blood pressure (!) 125/92, pulse 85, temperature 97.8 F (36.6 C), temperature source Oral, resp. rate 16, height 5\' 1"  (1.549 m),  weight 111.1 kg, last menstrual period 09/07/2022. Exam Physical Exam Constitutional:      General: She is not in acute distress.    Appearance: She is well-developed.  HENT:     Head: Normocephalic and atraumatic.  Eyes:     Pupils: Pupils are equal, round, and reactive to light.  Cardiovascular:     Rate and Rhythm: Normal rate and regular rhythm.     Heart sounds: No murmur heard.    No gallop.  Abdominal:     Tenderness: There is no abdominal tenderness. There is no guarding or rebound.   Genitourinary:    Vagina: Normal.     Uterus: Normal.   Musculoskeletal:        General: Normal range of motion.     Cervical back: Normal range of motion and neck supple.  Skin:    General: Skin is warm and dry.  Neurological:     Mental Status: She is alert and oriented to person, place, and time.     Prenatal labs: ABO, Rh: --/--/A POS (07/21 0900) Antibody: NEG (07/21 0900) Rubella: Immune (12/21 0000) RPR: Nonreactive (12/21 0000)  HBsAg: Negative (12/21 0000)  HIV: Non-reactive (12/21 0000)  GBS: Positive/-- (07/07 0000)   Cat 1 tracing, +accels, no decels, mod var Irr TOCO, pitocin at 2 CE 2/50/-2  Assessment/Plan: This is a 31yo [id number] @ 40 1/7 by 8wk TVUS NTO c/w LMP admitted for IOL at term. CE 2/50/-2, had consented for FB for ripening however SROM prior to placement. Plan to titrate pitocin per protocol, PCN for GBS ppx. Declining epidural at this time. Does desire circ for baby boy. Pelvis proven to 9lb1oz, anticipate SVD   Carlisle Cater 06/15/2023, 10:05 AM

## 2023-06-15 NOTE — Anesthesia Procedure Notes (Signed)
Epidural Patient location during procedure: OB Start time: 06/15/2023 8:28 PM End time: 06/15/2023 8:32 PM  Staffing Anesthesiologist: Bethena Midget, MD  Preanesthetic Checklist Completed: patient identified, IV checked, site marked, risks and benefits discussed, surgical consent, monitors and equipment checked, pre-op evaluation and timeout performed  Epidural Patient position: sitting Prep: DuraPrep and site prepped and draped Patient monitoring: continuous pulse ox and blood pressure Approach: midline Location: L3-L4 Injection technique: LOR air  Needle:  Needle type: Tuohy  Needle gauge: 17 G Needle length: 9 cm and 9 Needle insertion depth: 8 cm Catheter type: closed end flexible Catheter size: 19 Gauge Catheter at skin depth: 14 cm Test dose: negative  Assessment Events: blood not aspirated, no cerebrospinal fluid, injection not painful, no injection resistance, no paresthesia and negative IV test

## 2023-06-15 NOTE — Progress Notes (Signed)
Some painful contractions, no other issues BP 103/61   Pulse 76   Temp 98.4 F (36.9 C) (Oral)   Resp 20   Ht 5\' 1"  (1.549 m)   Wt 111.1 kg   LMP 09/07/2022   BMI 46.27 kg/m  CE 3/50/-2, IUPC placed Pitocin at 12 mU/min  Titrate pitocin q62m until adequate

## 2023-06-15 NOTE — Anesthesia Preprocedure Evaluation (Signed)
Anesthesia Evaluation  Patient identified by MRN, date of birth, ID band Patient awake    Reviewed: Allergy & Precautions, H&P , NPO status , Patient's Chart, lab work & pertinent test results, reviewed documented beta blocker date and time   Airway Mallampati: II  TM Distance: >3 FB Neck ROM: full    Dental no notable dental hx. (+) Teeth Intact, Dental Advisory Given   Pulmonary neg pulmonary ROS, shortness of breath   Pulmonary exam normal breath sounds clear to auscultation       Cardiovascular negative cardio ROS Normal cardiovascular exam Rhythm:regular Rate:Normal     Neuro/Psych  Neuromuscular disease negative neurological ROS  negative psych ROS   GI/Hepatic negative GI ROS, Neg liver ROS,GERD  ,,  Endo/Other  negative endocrine ROSHypothyroidism    Renal/GU negative Renal ROS  negative genitourinary   Musculoskeletal   Abdominal  (+) + obese  Peds  Hematology negative hematology ROS (+)   Anesthesia Other Findings   Reproductive/Obstetrics (+) Pregnancy                             Anesthesia Physical Anesthesia Plan  ASA: 3  Anesthesia Plan: Epidural   Post-op Pain Management: Minimal or no pain anticipated   Induction: Intravenous  PONV Risk Score and Plan: 2  Airway Management Planned:   Additional Equipment: None  Intra-op Plan:   Post-operative Plan:   Informed Consent: I have reviewed the patients History and Physical, chart, labs and discussed the procedure including the risks, benefits and alternatives for the proposed anesthesia with the patient or authorized representative who has indicated his/her understanding and acceptance.       Plan Discussed with: Anesthesiologist and CRNA  Anesthesia Plan Comments:        Anesthesia Quick Evaluation

## 2023-06-16 LAB — CBC
HCT: 29.5 % — ABNORMAL LOW (ref 36.0–46.0)
Hemoglobin: 9.6 g/dL — ABNORMAL LOW (ref 12.0–15.0)
MCH: 29.9 pg (ref 26.0–34.0)
MCHC: 32.5 g/dL (ref 30.0–36.0)
MCV: 91.9 fL (ref 80.0–100.0)
Platelets: 159 10*3/uL (ref 150–400)
RBC: 3.21 MIL/uL — ABNORMAL LOW (ref 3.87–5.11)
RDW: 13.7 % (ref 11.5–15.5)
WBC: 12.7 10*3/uL — ABNORMAL HIGH (ref 4.0–10.5)
nRBC: 0 % (ref 0.0–0.2)

## 2023-06-16 MED ORDER — SENNOSIDES-DOCUSATE SODIUM 8.6-50 MG PO TABS
2.0000 | ORAL_TABLET | Freq: Every day | ORAL | Status: DC
  Administered 2023-06-16 – 2023-06-17 (×2): 2 via ORAL
  Filled 2023-06-16 (×2): qty 2

## 2023-06-16 MED ORDER — ONDANSETRON HCL 4 MG/2ML IJ SOLN: 4.0000 mg | INTRAMUSCULAR | Status: DC | PRN

## 2023-06-16 MED ORDER — COCONUT OIL OIL: 1.0000 | TOPICAL_OIL | Status: DC | PRN

## 2023-06-16 MED ORDER — LEVOTHYROXINE SODIUM 100 MCG PO TABS: 200.0000 ug | ORAL_TABLET | Freq: Every day | ORAL | Status: DC

## 2023-06-16 MED ORDER — ZOLPIDEM TARTRATE 5 MG PO TABS: 5.0000 mg | ORAL_TABLET | Freq: Every evening | ORAL | Status: DC | PRN

## 2023-06-16 MED ORDER — BENZOCAINE-MENTHOL 20-0.5 % EX AERO
1.0000 | INHALATION_SPRAY | CUTANEOUS | Status: DC | PRN
  Administered 2023-06-16: 1 via TOPICAL
  Filled 2023-06-16: qty 56

## 2023-06-16 MED ORDER — ACETAMINOPHEN 325 MG PO TABS
650.0000 mg | ORAL_TABLET | ORAL | Status: DC | PRN
  Administered 2023-06-16: 650 mg via ORAL
  Filled 2023-06-16: qty 2

## 2023-06-16 MED ORDER — ONDANSETRON HCL 4 MG PO TABS: 4.0000 mg | ORAL_TABLET | ORAL | Status: DC | PRN

## 2023-06-16 MED ORDER — LEVOTHYROXINE SODIUM 100 MCG PO TABS
200.0000 ug | ORAL_TABLET | Freq: Every day | ORAL | Status: DC
  Administered 2023-06-16 – 2023-06-17 (×2): 200 ug via ORAL
  Filled 2023-06-16 (×2): qty 2

## 2023-06-16 MED ORDER — DIBUCAINE (PERIANAL) 1 % EX OINT: 1.0000 | TOPICAL_OINTMENT | CUTANEOUS | Status: DC | PRN

## 2023-06-16 MED ORDER — PRENATAL MULTIVITAMIN CH
1.0000 | ORAL_TABLET | Freq: Every day | ORAL | Status: DC
  Administered 2023-06-16 – 2023-06-17 (×2): 1 via ORAL
  Filled 2023-06-16 (×2): qty 1

## 2023-06-16 MED ORDER — FERROUS SULFATE 325 (65 FE) MG PO TABS
325.0000 mg | ORAL_TABLET | Freq: Every day | ORAL | Status: DC
  Administered 2023-06-16 – 2023-06-17 (×2): 325 mg via ORAL
  Filled 2023-06-16 (×2): qty 1

## 2023-06-16 MED ORDER — DIPHENHYDRAMINE HCL 25 MG PO CAPS
25.0000 mg | ORAL_CAPSULE | Freq: Four times a day (QID) | ORAL | Status: DC | PRN
  Administered 2023-06-16: 25 mg via ORAL
  Filled 2023-06-16: qty 1

## 2023-06-16 MED ORDER — IBUPROFEN 600 MG PO TABS
600.0000 mg | ORAL_TABLET | Freq: Four times a day (QID) | ORAL | Status: DC
  Administered 2023-06-16 – 2023-06-17 (×6): 600 mg via ORAL
  Filled 2023-06-16 (×7): qty 1

## 2023-06-16 MED ORDER — SIMETHICONE 80 MG PO CHEW: 80.0000 mg | CHEWABLE_TABLET | ORAL | Status: DC | PRN

## 2023-06-16 MED ORDER — WITCH HAZEL-GLYCERIN EX PADS: 1.0000 | MEDICATED_PAD | CUTANEOUS | Status: DC | PRN

## 2023-06-16 MED ORDER — TETANUS-DIPHTH-ACELL PERTUSSIS 5-2.5-18.5 LF-MCG/0.5 IM SUSY: 0.5000 mL | PREFILLED_SYRINGE | Freq: Once | INTRAMUSCULAR | Status: DC

## 2023-06-16 NOTE — Anesthesia Postprocedure Evaluation (Signed)
Anesthesia Post Note  Patient: Kristy Wright  Procedure(s) Performed: AN AD HOC LABOR EPIDURAL     Patient location during evaluation: Mother Baby Anesthesia Type: Epidural Level of consciousness: awake and alert Pain management: pain level controlled Vital Signs Assessment: post-procedure vital signs reviewed and stable Respiratory status: spontaneous breathing, nonlabored ventilation and respiratory function stable Cardiovascular status: stable Postop Assessment: no headache, no backache and epidural receding Anesthetic complications: no   No notable events documented.  Last Vitals:  Vitals:   06/16/23 0601 06/16/23 1412  BP: 108/68 105/61  Pulse: 98 76  Resp: 18 18  Temp: 36.5 C 36.7 C  SpO2: 98% 98%    Last Pain:  Vitals:   06/16/23 1412  TempSrc: Oral  PainSc: 0-No pain   Pain Goal: Patients Stated Pain Goal: 2 (06/15/23 2006)                 Ardis Lawley

## 2023-06-16 NOTE — Lactation Note (Signed)
This note was copied from a baby's chart. Lactation Consultation Note  Patient Name: Kristy Wright [id number] Date: 06/16/2023 Age:34 hours Reason for consult: Initial assessment;Term  P6, Mother states for now she is going to formula feed only and will decided tomorrow when is more rested if she wants to breastfeed or not.  Provided lactation information sheet.    Maternal Data Has patient been taught Hand Expression?: Yes  Feeding Mother's Current Feeding Choice: Breast Milk and Formula  Interventions Interventions: LC Services brochure Consult Status Consult Status: PRN    Hardie Pulley 06/16/2023, 8:12 AM

## 2023-06-16 NOTE — Progress Notes (Addendum)
Post Partum Day 1 Subjective: Patient is doing well this morning. Pain is controlled. Ambulating, voiding, tolerating PO. Minimal lochia. Breastfeeding.   Objective: Patient Vitals for the past 24 hrs:  BP Temp Temp src Pulse Resp SpO2 Height Weight  06/16/23 0601 108/68 97.7 F (36.5 C) Oral 98 18 98 % -- --  06/16/23 0146 113/64 98.4 F (36.9 C) Oral 100 17 98 % -- --  06/16/23 0030 101/69 98.5 F (36.9 C) Oral 98 18 -- -- --  06/16/23 0001 (!) 122/59 -- -- 89 -- -- -- --  06/15/23 2347 (!) 110/59 -- -- 70 -- -- -- --  06/15/23 2330 (!) 92/54 -- -- 83 -- -- -- --  06/15/23 2316 106/60 -- -- 85 -- -- -- --  06/15/23 2301 104/67 -- -- (!) 102 -- -- -- --  06/15/23 2247 (!) 114/48 -- -- 88 -- -- -- --  06/15/23 2231 (!) 138/120 -- -- (!) 182 -- -- -- --  06/15/23 2200 122/67 -- -- 95 -- -- -- --  06/15/23 2132 (!) 103/59 98.2 F (36.8 C) Oral 84 -- -- -- --  06/15/23 2130 -- -- -- -- -- 99 % -- --  06/15/23 2125 -- -- -- -- -- 97 % -- --  06/15/23 2120 -- -- -- -- -- 99 % -- --  06/15/23 2115 (!) 102/55 -- -- 89 -- 99 % -- --  06/15/23 2110 (!) 95/53 -- -- 85 -- 98 % -- --  06/15/23 2105 (!) 114/49 -- -- 82 -- 98 % -- --  06/15/23 2100 (!) 119/59 -- -- 82 -- 99 % -- --  06/15/23 2055 111/60 -- -- 83 -- 98 % -- --  06/15/23 2051 (!) 111/58 -- -- 86 -- -- -- --  06/15/23 2046 114/63 -- -- 80 -- -- -- --  06/15/23 2042 118/63 -- -- 72 -- -- -- --  06/15/23 2036 124/65 -- -- 70 -- -- -- --  06/15/23 2031 120/74 -- -- 95 -- -- -- --  06/15/23 1903 132/79 -- -- 88 16 -- -- --  06/15/23 1855 -- -- -- -- 20 -- -- --  06/15/23 1835 126/70 -- -- 77 18 -- -- --  06/15/23 1804 131/70 97.7 F (36.5 C) Axillary 89 16 -- -- --  06/15/23 1735 123/72 -- -- 74 20 -- -- --  06/15/23 1704 119/62 -- -- 80 19 -- -- --  06/15/23 1633 120/67 -- -- 74 16 -- -- --  06/15/23 1543 103/61 98.4 F (36.9 C) Oral 76 20 -- -- --  06/15/23 1513 113/65 -- -- 80 19 -- -- --  06/15/23 1436 (!) 109/53 -- --  80 16 -- -- --  06/15/23 1347 112/68 -- -- 85 20 -- -- --  06/15/23 1303 115/71 -- -- 83 16 -- -- --  06/15/23 1209 121/66 97.6 F (36.4 C) Oral 83 17 -- -- --  06/15/23 1103 120/71 -- -- 86 16 -- -- --  06/15/23 1008 102/86 97.7 F (36.5 C) Axillary 81 20 -- -- --  06/15/23 0944 (!) 125/92 97.8 F (36.6 C) Oral 85 16 -- -- --  06/15/23 0848 -- -- -- -- -- -- 5\' 1"  (1.549 m) 111.1 kg  06/15/23 0822 137/77 97.7 F (36.5 C) Axillary 100 20 -- -- --    Physical Exam:  General: alert, cooperative, and no distress Lochia: appropriate Uterine Fundus: firm DVT Evaluation: No evidence of DVT seen on physical  exam.  Recent Labs    06/15/23 0859 06/16/23 0420  WBC 7.7 12.7*  HGB 10.7* 9.6*  HCT 32.7* 29.5*  PLT 191 159    No results for input(s): "NA", "K", "CL", "CO2CT", "BUN", "CREATININE", "GLUCOSE", "BILITOT", "ALT", "AST", "ALKPHOS", "PROT", "ALBUMIN" in the last 72 hours.  No results for input(s): "CALCIUM", "MG", "PHOS" in the last 72 hours.  No results for input(s): "PROTIME", "APTT", "INR" in the last 72 hours.  No results for input(s): "PROTIME", "APTT", "INR", "FIBRINOGEN" in the last 72 hours. Assessment/Plan: Kristy Wright 32 y.o. [id number] PPD#1 sp SVD 1. PPC: routine PP care 2. ABLA: asymptomatic, PO iron ordered 3. Desires circumcision. Delivered at 2230 last night, has not yet voided. Anticipate circumcision later today vs. Tomorrow.    LOS: 1 day   Charlett Nose 06/16/2023, 7:42 AM    Addendum:  Desires neonatal circumcision, R/B/A of procedure discussed at length. Pt understands that neonatal circumcision is not considered medically necessary and is elective. The risks include, but are not limited to bleeding, infection, damage to the penis, development of scar tissue, and having to have it redone at a later date. Pt understands theses risks and wishes to proceed.   Alinda Deem, MD 06/16/23 12:02 PM

## 2023-06-17 ENCOUNTER — Other Ambulatory Visit: Payer: Self-pay

## 2023-06-17 ENCOUNTER — Encounter (HOSPITAL_COMMUNITY): Payer: Self-pay | Admitting: Obstetrics and Gynecology

## 2023-06-17 MED ORDER — IBUPROFEN 600 MG PO TABS: 600.0000 mg | ORAL_TABLET | Freq: Four times a day (QID) | ORAL | 0 refills | Status: DC

## 2023-06-17 NOTE — Progress Notes (Signed)
PPD #2 Doing well Afeb, VSS D/c home 

## 2023-06-17 NOTE — Discharge Instructions (Signed)
As per discharge pamphlet °

## 2023-06-17 NOTE — Discharge Summary (Signed)
Postpartum Discharge Summary      Patient Name: Kristy Wright DOB: Feb 24, 1991 MRN: [id number]  Date of admission: 06/15/2023 Delivery date:06/15/2023 Delivering provider: Carlisle Cater Date of discharge: 06/17/2023  Admitting diagnosis: [redacted] weeks gestation of pregnancy [Z3A.39] Intrauterine pregnancy: [redacted]w[redacted]d     Secondary diagnosis:  Principal Problem:   [redacted] weeks gestation of pregnancy    Discharge diagnosis: Term Pregnancy Delivered and maternal obesity, grand multip                                                Hospital course: Induction of Labor With Vaginal Delivery   32 y.o. yo [id number] at [redacted]w[redacted]d was admitted to the hospital 06/15/2023 for induction of labor.  Indication for induction:  BMI>40 .  Patient had an labor course complicated by nothing Membrane Rupture Time/Date: 10:01 AM,06/15/2023  Delivery Method:Vaginal, Spontaneous Episiotomy: None Lacerations:  None Details of delivery can be found in separate delivery note.  Patient had a postpartum course complicated by nothing. Patient is discharged home 06/17/23.  Newborn Data: Birth date:06/15/2023 Birth time:10:30 PM Gender:Female Living status:Living Apgars:9 ,9  Weight:3430 g   Physical exam  Vitals:   06/16/23 0601 06/16/23 1412 06/16/23 2027 06/17/23 0532  BP: 108/68 105/61 125/67 109/64  Pulse: 98 76 83 76  Resp: 18 18 19    Temp: 97.7 F (36.5 C) 98 F (36.7 C) 98.2 F (36.8 C) 97.8 F (36.6 C)  TempSrc: Oral Oral Oral Oral  SpO2: 98% 98% 98% 99%  Weight:      Height:       General: alert Lochia: appropriate Uterine Fundus: firm  Labs: Lab Results  Component Value Date   WBC 12.7 (H) 06/16/2023   HGB 9.6 (L) 06/16/2023   HCT 29.5 (L) 06/16/2023   MCV 91.9 06/16/2023   PLT 159 06/16/2023      Latest Ref Rng & Units 04/30/2023    7:11 PM  CMP  Glucose 70 - 99 mg/dL 94   BUN 6 - 20 mg/dL 10   Creatinine 1.47 - 1.00 mg/dL 8.29   Sodium 562 - 130 mmol/L 136   Potassium 3.5 - 5.1 mmol/L  3.7   Chloride 98 - 111 mmol/L 104   CO2 22 - 32 mmol/L 22   Calcium 8.9 - 10.3 mg/dL 9.3   Total Protein 6.5 - 8.1 g/dL 6.4   Total Bilirubin 0.3 - 1.2 mg/dL 0.6   Alkaline Phos 38 - 126 U/L 79   AST 15 - 41 U/L 19   ALT 0 - 44 U/L 17    Edinburgh Score:    06/16/2023    8:51 PM  Edinburgh Postnatal Depression Scale Screening Tool  I have been able to laugh and see the funny side of things. 0  I have looked forward with enjoyment to things. 0  I have blamed myself unnecessarily when things went wrong. 0  I have been anxious or worried for no good reason. 0  I have felt scared or panicky for no good reason. 0  Things have been getting on top of me. 0  I have been so unhappy that I have had difficulty sleeping. 0  I have felt sad or miserable. 0  I have been so unhappy that I have been crying. 0  The thought of harming myself has occurred to me.  0  Edinburgh Postnatal Depression Scale Total 0      After visit meds:  Allergies as of 06/17/2023   No Known Allergies      Medication List     TAKE these medications    Blood Pressure Cuff Misc This prescription is good for one blood pressure monitor with cuff.   ibuprofen 600 MG tablet Commonly known as: ADVIL Take 1 tablet (600 mg total) by mouth every 6 (six) hours.   levothyroxine 200 MCG tablet Commonly known as: SYNTHROID Take 1 tablet (200 mcg total) by mouth daily before breakfast.   meclizine 25 MG tablet Commonly known as: ANTIVERT Take 1 tablet (25 mg total) by mouth 3 (three) times daily as needed for dizziness.   multivitamin-prenatal 27-0.8 MG Tabs tablet Take 1 tablet by mouth daily at 12 noon.         Discharge home in stable condition Infant Feeding: Breast Infant Disposition:home with mother Discharge instruction: per After Visit Summary and Postpartum booklet. Activity: Advance as tolerated. Pelvic rest for 6 weeks.  Diet: routine diet Postpartum Appointment:6 weeks Future  Appointments: Future Appointments  Date Time Provider Department Center  07/25/2023  1:20 PM Tobb, Lavona Mound, DO [facility] None   Follow up Visit:  Follow-up Information     [facility]. Schedule an appointment as soon as possible for a visit in 6 week(s).   Contact information: [address]  [address] [place] [address] [phone]                     06/17/2023 Zenaida Niece, MD

## 2023-06-17 NOTE — Plan of Care (Signed)
  Problem: Education: ?Goal: Knowledge of General Education information will improve ?Description: Including pain rating scale, medication(s)/side effects and non-pharmacologic comfort measures ?Outcome: Adequate for Discharge ?  ?Problem: Health Behavior/Discharge Planning: ?Goal: Ability to manage health-related needs will improve ?Outcome: Adequate for Discharge ?  ?Problem: Clinical Measurements: ?Goal: Ability to maintain clinical measurements within normal limits will improve ?Outcome: Adequate for Discharge ?Goal: Will remain free from infection ?Outcome: Adequate for Discharge ?Goal: Diagnostic test results will improve ?Outcome: Adequate for Discharge ?Goal: Respiratory complications will improve ?Outcome: Adequate for Discharge ?Goal: Cardiovascular complication will be avoided ?Outcome: Adequate for Discharge ?  ?Problem: Activity: ?Goal: Risk for activity intolerance will decrease ?Outcome: Adequate for Discharge ?  ?Problem: Nutrition: ?Goal: Adequate nutrition will be maintained ?Outcome: Adequate for Discharge ?  ?Problem: Coping: ?Goal: Level of anxiety will decrease ?Outcome: Adequate for Discharge ?  ?Problem: Elimination: ?Goal: Will not experience complications related to bowel motility ?Outcome: Adequate for Discharge ?Goal: Will not experience complications related to urinary retention ?Outcome: Adequate for Discharge ?  ?Problem: Pain Managment: ?Goal: General experience of comfort will improve ?Outcome: Adequate for Discharge ?  ?Problem: Safety: ?Goal: Ability to remain free from injury will improve ?Outcome: Adequate for Discharge ?  ?Problem: Skin Integrity: ?Goal: Risk for impaired skin integrity will decrease ?Outcome: Adequate for Discharge ?  ?Problem: Education: ?Goal: Knowledge of Childbirth will improve ?Outcome: Adequate for Discharge ?Goal: Ability to make informed decisions regarding treatment and plan of care will improve ?Outcome: Adequate for Discharge ?Goal: Ability to state  and carry out methods to decrease the pain will improve ?Outcome: Adequate for Discharge ?Goal: Individualized Educational Video(s) ?Outcome: Adequate for Discharge ?  ?Problem: Coping: ?Goal: Ability to verbalize concerns and feelings about labor and delivery will improve ?Outcome: Adequate for Discharge ?  ?Problem: Life Cycle: ?Goal: Ability to make normal progression through stages of labor will improve ?Outcome: Adequate for Discharge ?Goal: Ability to effectively push during vaginal delivery will improve ?Outcome: Adequate for Discharge ?  ?Problem: Role Relationship: ?Goal: Will demonstrate positive interactions with the child ?Outcome: Adequate for Discharge ?  ?Problem: Safety: ?Goal: Risk of complications during labor and delivery will decrease ?Outcome: Adequate for Discharge ?  ?Problem: Pain Management: ?Goal: Relief or control of pain from uterine contractions will improve ?Outcome: Adequate for Discharge ?  ?Problem: Education: ?Goal: Knowledge of condition will improve ?Outcome: Adequate for Discharge ?Goal: Individualized Educational Video(s) ?Outcome: Adequate for Discharge ?Goal: Individualized Newborn Educational Video(s) ?Outcome: Adequate for Discharge ?  ?Problem: Activity: ?Goal: Will verbalize the importance of balancing activity with adequate rest periods ?Outcome: Adequate for Discharge ?Goal: Ability to tolerate increased activity will improve ?Outcome: Adequate for Discharge ?  ?Problem: Coping: ?Goal: Ability to identify and utilize available resources and services will improve ?Outcome: Adequate for Discharge ?  ?Problem: Life Cycle: ?Goal: Chance of risk for complications during the postpartum period will decrease ?Outcome: Adequate for Discharge ?  ?Problem: Role Relationship: ?Goal: Ability to demonstrate positive interaction with newborn will improve ?Outcome: Adequate for Discharge ?  ?Problem: Skin Integrity: ?Goal: Demonstration of wound healing without infection will  improve ?Outcome: Adequate for Discharge ?  ?

## 2023-06-25 ENCOUNTER — Encounter: Payer: Self-pay | Admitting: Cardiology

## 2023-06-26 DIAGNOSIS — Z419 Encounter for procedure for purposes other than remedying health state, unspecified: Secondary | ICD-10-CM | POA: Diagnosis not present

## 2023-06-27 ENCOUNTER — Ambulatory Visit
Admission: EM | Admit: 2023-06-27 | Discharge: 2023-06-27 | Disposition: A | Discharge status: Home / Self Care | Payer: Medicaid Other | Attending: Surgery | Admitting: Surgery

## 2023-06-27 ENCOUNTER — Observation Stay: Payer: Medicaid Other | Admitting: Certified Registered"

## 2023-06-27 ENCOUNTER — Other Ambulatory Visit: Payer: Self-pay

## 2023-06-27 ENCOUNTER — Encounter
Admission: EM | Disposition: A | Discharge status: Home / Self Care | Payer: Self-pay | Source: Home / Self Care | Attending: Emergency Medicine

## 2023-06-27 ENCOUNTER — Emergency Department: Payer: Medicaid Other

## 2023-06-27 DIAGNOSIS — K429 Umbilical hernia without obstruction or gangrene: Secondary | ICD-10-CM | POA: Diagnosis not present

## 2023-06-27 DIAGNOSIS — K219 Gastro-esophageal reflux disease without esophagitis: Secondary | ICD-10-CM | POA: Diagnosis not present

## 2023-06-27 DIAGNOSIS — K810 Acute cholecystitis: Secondary | ICD-10-CM | POA: Diagnosis present

## 2023-06-27 DIAGNOSIS — R1011 Right upper quadrant pain: Secondary | ICD-10-CM | POA: Diagnosis not present

## 2023-06-27 DIAGNOSIS — K819 Cholecystitis, unspecified: Secondary | ICD-10-CM | POA: Diagnosis not present

## 2023-06-27 DIAGNOSIS — K8012 Calculus of gallbladder with acute and chronic cholecystitis without obstruction: Secondary | ICD-10-CM | POA: Diagnosis not present

## 2023-06-27 DIAGNOSIS — K8020 Calculus of gallbladder without cholecystitis without obstruction: Secondary | ICD-10-CM | POA: Diagnosis not present

## 2023-06-27 DIAGNOSIS — K828 Other specified diseases of gallbladder: Secondary | ICD-10-CM | POA: Diagnosis not present

## 2023-06-27 HISTORY — PX: UMBILICAL HERNIA REPAIR: SHX196

## 2023-06-27 LAB — COMPREHENSIVE METABOLIC PANEL
ALT: 72 U/L — ABNORMAL HIGH (ref 0–44)
AST: 51 U/L — ABNORMAL HIGH (ref 15–41)
Albumin: 3.5 g/dL (ref 3.5–5.0)
Alkaline Phosphatase: 92 U/L (ref 38–126)
Anion gap: 8 (ref 5–15)
BUN: 19 mg/dL (ref 6–20)
CO2: 27 mmol/L (ref 22–32)
Calcium: 9.2 mg/dL (ref 8.9–10.3)
Chloride: 102 mmol/L (ref 98–111)
Creatinine, Ser: 0.85 mg/dL (ref 0.44–1.00)
GFR, Estimated: >60 mL/min (ref >60)
Glucose, Bld: 99 mg/dL (ref 70–99)
Potassium: 3.9 mmol/L (ref 3.5–5.1)
Sodium: 137 mmol/L (ref 135–145)
Total Bilirubin: 0.5 mg/dL (ref 0.3–1.2)
Total Protein: 7.4 g/dL (ref 6.5–8.1)

## 2023-06-27 LAB — URINALYSIS, ROUTINE W REFLEX MICROSCOPIC
Bacteria, UA: NONE SEEN
Bilirubin Urine: NEGATIVE
Glucose, UA: NEGATIVE mg/dL
Ketones, ur: NEGATIVE mg/dL
Nitrite: NEGATIVE
Protein, ur: NEGATIVE mg/dL
Specific Gravity, Urine: 1.025 (ref 1.005–1.030)
pH: 6 (ref 5.0–8.0)

## 2023-06-27 LAB — POC URINE PREG, ED: Preg Test, Ur: NEGATIVE

## 2023-06-27 LAB — CBC
HCT: 39 % (ref 36.0–46.0)
Hemoglobin: 12.5 g/dL (ref 12.0–15.0)
MCH: 28.9 pg (ref 26.0–34.0)
MCHC: 32.1 g/dL (ref 30.0–36.0)
MCV: 90.3 fL (ref 80.0–100.0)
Platelets: 295 10*3/uL (ref 150–400)
RBC: 4.32 MIL/uL (ref 3.87–5.11)
RDW: 13 % (ref 11.5–15.5)
WBC: 7.2 10*3/uL (ref 4.0–10.5)
nRBC: 0 % (ref 0.0–0.2)

## 2023-06-27 LAB — LIPASE, BLOOD: Lipase: 44 U/L (ref 11–51)

## 2023-06-27 SURGERY — CHOLECYSTECTOMY, ROBOT-ASSISTED, LAPAROSCOPIC
Anesthesia: General | Site: Abdomen

## 2023-06-27 MED ORDER — ACETAMINOPHEN 10 MG/ML IV SOLN: 1000.0000 mg | Freq: Once | INTRAVENOUS | Status: DC | PRN

## 2023-06-27 MED ORDER — POLYETHYLENE GLYCOL 3350 17 G PO PACK: 17.0000 g | PACK | Freq: Every day | ORAL | Status: DC | PRN

## 2023-06-27 MED ORDER — SUCCINYLCHOLINE CHLORIDE 200 MG/10ML IV SOSY
PREFILLED_SYRINGE | INTRAVENOUS | Status: AC
  Filled 2023-06-27: qty 10

## 2023-06-27 MED ORDER — DEXAMETHASONE SODIUM PHOSPHATE 10 MG/ML IJ SOLN
INTRAMUSCULAR | Status: DC | PRN
  Administered 2023-06-27: 10 mg via INTRAVENOUS

## 2023-06-27 MED ORDER — MIDAZOLAM HCL 2 MG/2ML IJ SOLN
INTRAMUSCULAR | Status: DC | PRN
  Administered 2023-06-27: 2 mg via INTRAVENOUS

## 2023-06-27 MED ORDER — BUPIVACAINE LIPOSOME 1.3 % IJ SUSP
INTRAMUSCULAR | Status: AC
  Filled 2023-06-27: qty 20

## 2023-06-27 MED ORDER — IBUPROFEN 600 MG PO TABS: 600.0000 mg | ORAL_TABLET | Freq: Three times a day (TID) | ORAL | 1 refills | Status: DC | PRN

## 2023-06-27 MED ORDER — OXYCODONE HCL 5 MG PO TABS
5.0000 mg | ORAL_TABLET | Freq: Once | ORAL | Status: AC | PRN
  Administered 2023-06-27: 5 mg via ORAL

## 2023-06-27 MED ORDER — FENTANYL CITRATE (PF) 100 MCG/2ML IJ SOLN
INTRAMUSCULAR | Status: AC
  Filled 2023-06-27: qty 2

## 2023-06-27 MED ORDER — ONDANSETRON HCL 4 MG/2ML IJ SOLN
INTRAMUSCULAR | Status: AC
  Filled 2023-06-27: qty 2

## 2023-06-27 MED ORDER — ACETAMINOPHEN 500 MG PO TABS
1000.0000 mg | ORAL_TABLET | Freq: Four times a day (QID) | ORAL | Status: DC
  Administered 2023-06-27: 1000 mg via ORAL
  Filled 2023-06-27: qty 2

## 2023-06-27 MED ORDER — LACTATED RINGERS IV SOLN: INTRAVENOUS | Status: DC

## 2023-06-27 MED ORDER — LIDOCAINE HCL (PF) 2 % IJ SOLN
INTRAMUSCULAR | Status: AC
  Filled 2023-06-27: qty 5

## 2023-06-27 MED ORDER — 0.9 % SODIUM CHLORIDE (POUR BTL) OPTIME
TOPICAL | Status: DC | PRN
  Administered 2023-06-27: 500 mL

## 2023-06-27 MED ORDER — LIDOCAINE HCL (CARDIAC) PF 100 MG/5ML IV SOSY
PREFILLED_SYRINGE | INTRAVENOUS | Status: DC | PRN
  Administered 2023-06-27: 100 mg via INTRAVENOUS

## 2023-06-27 MED ORDER — KETOROLAC TROMETHAMINE 15 MG/ML IJ SOLN
INTRAMUSCULAR | Status: DC | PRN
  Administered 2023-06-27: 30 mg via INTRAVENOUS

## 2023-06-27 MED ORDER — PIPERACILLIN-TAZOBACTAM 3.375 G IVPB
3.3750 g | Freq: Three times a day (TID) | INTRAVENOUS | Status: DC
  Administered 2023-06-27: 3.375 g via INTRAVENOUS

## 2023-06-27 MED ORDER — CHLORHEXIDINE GLUCONATE 0.12 % MT SOLN
15.0000 mL | Freq: Once | OROMUCOSAL | Status: AC
  Administered 2023-06-27: 15 mL via OROMUCOSAL

## 2023-06-27 MED ORDER — ROCURONIUM BROMIDE 10 MG/ML (PF) SYRINGE
PREFILLED_SYRINGE | INTRAVENOUS | Status: AC
  Filled 2023-06-27: qty 10

## 2023-06-27 MED ORDER — ACETAMINOPHEN 10 MG/ML IV SOLN
INTRAVENOUS | Status: DC | PRN
  Administered 2023-06-27: 1000 mg via INTRAVENOUS

## 2023-06-27 MED ORDER — SUCCINYLCHOLINE CHLORIDE 200 MG/10ML IV SOSY
PREFILLED_SYRINGE | INTRAVENOUS | Status: DC | PRN
  Administered 2023-06-27: 120 mg via INTRAVENOUS

## 2023-06-27 MED ORDER — INDOCYANINE GREEN 25 MG IV SOLR
INTRAVENOUS | Status: AC
  Filled 2023-06-27: qty 10

## 2023-06-27 MED ORDER — PROPOFOL 10 MG/ML IV BOLUS
INTRAVENOUS | Status: DC | PRN
Start: 2023-06-27 — End: 2023-06-27
  Administered 2023-06-27: 150 mg via INTRAVENOUS

## 2023-06-27 MED ORDER — OXYCODONE HCL 5 MG/5ML PO SOLN: 5.0000 mg | Freq: Once | ORAL | Status: AC | PRN

## 2023-06-27 MED ORDER — PHENYLEPHRINE 80 MCG/ML (10ML) SYRINGE FOR IV PUSH (FOR BLOOD PRESSURE SUPPORT)
PREFILLED_SYRINGE | INTRAVENOUS | Status: DC | PRN
  Administered 2023-06-27: 160 ug via INTRAVENOUS

## 2023-06-27 MED ORDER — OXYCODONE HCL 5 MG PO TABS: 5.0000 mg | ORAL_TABLET | ORAL | 0 refills | Status: DC | PRN

## 2023-06-27 MED ORDER — HYDROMORPHONE HCL 1 MG/ML IJ SOLN: 0.5000 mg | INTRAMUSCULAR | Status: DC | PRN

## 2023-06-27 MED ORDER — DROPERIDOL 2.5 MG/ML IJ SOLN: 0.6250 mg | Freq: Once | INTRAMUSCULAR | Status: DC | PRN

## 2023-06-27 MED ORDER — SODIUM CHLORIDE 0.9 % IV BOLUS
1000.0000 mL | Freq: Once | INTRAVENOUS | Status: AC
  Administered 2023-06-27: 1000 mL via INTRAVENOUS

## 2023-06-27 MED ORDER — CHLORHEXIDINE GLUCONATE 0.12 % MT SOLN
OROMUCOSAL | Status: AC
  Filled 2023-06-27: qty 15

## 2023-06-27 MED ORDER — ONDANSETRON HCL 4 MG/2ML IJ SOLN
INTRAMUSCULAR | Status: DC | PRN
  Administered 2023-06-27: 4 mg via INTRAVENOUS

## 2023-06-27 MED ORDER — ONDANSETRON 4 MG PO TBDP: 4.0000 mg | ORAL_TABLET | Freq: Four times a day (QID) | ORAL | Status: DC | PRN

## 2023-06-27 MED ORDER — ACETAMINOPHEN 10 MG/ML IV SOLN
INTRAVENOUS | Status: AC
  Filled 2023-06-27: qty 100

## 2023-06-27 MED ORDER — PROMETHAZINE HCL 25 MG/ML IJ SOLN: 6.2500 mg | INTRAMUSCULAR | Status: DC | PRN

## 2023-06-27 MED ORDER — BUPIVACAINE-EPINEPHRINE (PF) 0.25% -1:200000 IJ SOLN
INTRAMUSCULAR | Status: AC
  Filled 2023-06-27: qty 30

## 2023-06-27 MED ORDER — SODIUM CHLORIDE 0.9 % IR SOLN
Status: DC | PRN
Start: 2023-06-27 — End: 2023-06-27
  Administered 2023-06-27: 1000 mL

## 2023-06-27 MED ORDER — DEXAMETHASONE SODIUM PHOSPHATE 10 MG/ML IJ SOLN
INTRAMUSCULAR | Status: AC
  Filled 2023-06-27: qty 1

## 2023-06-27 MED ORDER — AMOXICILLIN-POT CLAVULANATE 875-125 MG PO TABS: 1.0000 | ORAL_TABLET | Freq: Two times a day (BID) | ORAL | 0 refills | Status: DC

## 2023-06-27 MED ORDER — PIPERACILLIN-TAZOBACTAM 3.375 G IVPB 30 MIN
3.3750 g | Freq: Once | INTRAVENOUS | Status: AC
  Administered 2023-06-27: 3.375 g via INTRAVENOUS
  Filled 2023-06-27: qty 50

## 2023-06-27 MED ORDER — SUGAMMADEX SODIUM 200 MG/2ML IV SOLN
INTRAVENOUS | Status: DC | PRN
  Administered 2023-06-27: 200 mg via INTRAVENOUS

## 2023-06-27 MED ORDER — ONDANSETRON HCL 4 MG/2ML IJ SOLN: 4.0000 mg | Freq: Four times a day (QID) | INTRAMUSCULAR | Status: DC | PRN

## 2023-06-27 MED ORDER — KETOROLAC TROMETHAMINE 30 MG/ML IJ SOLN
INTRAMUSCULAR | Status: AC
  Filled 2023-06-27: qty 1

## 2023-06-27 MED ORDER — KETOROLAC TROMETHAMINE 15 MG/ML IJ SOLN
15.0000 mg | Freq: Once | INTRAMUSCULAR | Status: AC
  Administered 2023-06-27: 15 mg via INTRAVENOUS
  Filled 2023-06-27: qty 1

## 2023-06-27 MED ORDER — MIDAZOLAM HCL 2 MG/2ML IJ SOLN
INTRAMUSCULAR | Status: AC
  Filled 2023-06-27: qty 2

## 2023-06-27 MED ORDER — PROPOFOL 10 MG/ML IV BOLUS
INTRAVENOUS | Status: AC
  Filled 2023-06-27: qty 20

## 2023-06-27 MED ORDER — FENTANYL CITRATE (PF) 100 MCG/2ML IJ SOLN
INTRAMUSCULAR | Status: DC | PRN
  Administered 2023-06-27: 50 ug via INTRAVENOUS
  Administered 2023-06-27: 25 ug via INTRAVENOUS
  Administered 2023-06-27: 50 ug via INTRAVENOUS
  Administered 2023-06-27: 25 ug via INTRAVENOUS
  Administered 2023-06-27: 50 ug via INTRAVENOUS

## 2023-06-27 MED ORDER — BUPIVACAINE LIPOSOME 1.3 % IJ SUSP
INTRAMUSCULAR | Status: DC | PRN
  Administered 2023-06-27: 20 mL

## 2023-06-27 MED ORDER — EPHEDRINE SULFATE (PRESSORS) 50 MG/ML IJ SOLN
INTRAMUSCULAR | Status: DC | PRN
  Administered 2023-06-27: 5 mg via INTRAVENOUS
  Administered 2023-06-27: 10 mg via INTRAVENOUS

## 2023-06-27 MED ORDER — OXYCODONE HCL 5 MG PO TABS
ORAL_TABLET | ORAL | Status: AC
  Filled 2023-06-27: qty 1

## 2023-06-27 MED ORDER — BUPIVACAINE-EPINEPHRINE (PF) 0.25% -1:200000 IJ SOLN
INTRAMUSCULAR | Status: DC | PRN
  Administered 2023-06-27: 30 mL

## 2023-06-27 MED ORDER — PANTOPRAZOLE SODIUM 40 MG IV SOLR: 40.0000 mg | Freq: Every day | INTRAVENOUS | Status: DC

## 2023-06-27 MED ORDER — INDOCYANINE GREEN 25 MG IV SOLR
2.5000 mg | INTRAVENOUS | Status: AC
  Administered 2023-06-27: 2.5 mg via INTRAVENOUS
  Filled 2023-06-27: qty 10

## 2023-06-27 MED ORDER — ONDANSETRON HCL 4 MG/2ML IJ SOLN
4.0000 mg | Freq: Once | INTRAMUSCULAR | Status: AC
  Administered 2023-06-27: 4 mg via INTRAVENOUS
  Filled 2023-06-27: qty 2

## 2023-06-27 MED ORDER — ROCURONIUM BROMIDE 100 MG/10ML IV SOLN
INTRAVENOUS | Status: DC | PRN
  Administered 2023-06-27 (×2): 20 mg via INTRAVENOUS

## 2023-06-27 MED ORDER — ACETAMINOPHEN 500 MG PO TABS: 1000.0000 mg | ORAL_TABLET | Freq: Four times a day (QID) | ORAL | Status: DC | PRN

## 2023-06-27 MED ORDER — FENTANYL CITRATE (PF) 100 MCG/2ML IJ SOLN
25.0000 ug | INTRAMUSCULAR | Status: DC | PRN
  Administered 2023-06-27 (×2): 25 ug via INTRAVENOUS

## 2023-06-27 SURGICAL SUPPLY — 55 items
ADH SKN CLS APL DERMABOND .7 (GAUZE/BANDAGES/DRESSINGS) ×2
BAG PRESSURE INF REUSE 1000 (BAG) IMPLANT
CANNULA CAP OBTURATR AIRSEAL 8 (CAP) IMPLANT
CAUTERY HOOK MNPLR 1.6 DVNC XI (INSTRUMENTS) ×2 IMPLANT
CLIP LIGATING HEMO O LOK GREEN (MISCELLANEOUS) ×2 IMPLANT
DERMABOND ADVANCED .7 DNX12 (GAUZE/BANDAGES/DRESSINGS) ×2 IMPLANT
DRAPE ARM DVNC X/XI (DISPOSABLE) ×8 IMPLANT
DRAPE COLUMN DVNC XI (DISPOSABLE) ×2 IMPLANT
ELECT CAUTERY BLADE 6.4 (BLADE) ×2 IMPLANT
ELECT CAUTERY BLADE TIP 2.5 (TIP) ×2
ELECT COATED BLADE 2.86 ST (ELECTRODE) IMPLANT
ELECT REM PT RETURN 9FT ADLT (ELECTROSURGICAL) ×2
ELECTRODE CAUTERY BLDE TIP 2.5 (TIP) ×2 IMPLANT
ELECTRODE REM PT RTRN 9FT ADLT (ELECTROSURGICAL) ×2 IMPLANT
FORCEPS BPLR R/ABLATION 8 DVNC (INSTRUMENTS) ×2 IMPLANT
FORCEPS PROGRASP DVNC XI (FORCEP) ×2 IMPLANT
GLOVE SURG SYN 7.0 (GLOVE) ×4 IMPLANT
GLOVE SURG SYN 7.0 PF PI (GLOVE) ×4 IMPLANT
GLOVE SURG SYN 7.5 E (GLOVE) ×4 IMPLANT
GLOVE SURG SYN 7.5 PF PI (GLOVE) ×4 IMPLANT
GOWN STRL REUS W/ TWL LRG LVL3 (GOWN DISPOSABLE) ×8 IMPLANT
GOWN STRL REUS W/TWL LRG LVL3 (GOWN DISPOSABLE) ×8
IRRIGATION STRYKERFLOW (MISCELLANEOUS) IMPLANT
IRRIGATOR STRYKERFLOW (MISCELLANEOUS) ×2
IRRIGATOR SUCT 8 DISP DVNC XI (IRRIGATION / IRRIGATOR) IMPLANT
IV NS 1000ML (IV SOLUTION) ×2
IV NS 1000ML BAXH (IV SOLUTION) IMPLANT
KIT PINK PAD W/HEAD ARE REST (MISCELLANEOUS) ×2 IMPLANT
KIT PINK PAD W/HEAD ARM REST (MISCELLANEOUS) ×2 IMPLANT
LABEL OR SOLS (LABEL) ×2 IMPLANT
MANIFOLD NEPTUNE II (INSTRUMENTS) ×2 IMPLANT
NDL HYPO 22X1.5 SAFETY MO (MISCELLANEOUS) ×2 IMPLANT
NEEDLE HYPO 22X1.5 SAFETY MO (MISCELLANEOUS) ×2 IMPLANT
NS IRRIG 500ML POUR BTL (IV SOLUTION) ×2 IMPLANT
OBTURATOR OPTICAL STND 8 DVNC (TROCAR) ×2
OBTURATOR OPTICALSTD 8 DVNC (TROCAR) ×2 IMPLANT
PACK LAP CHOLECYSTECTOMY (MISCELLANEOUS) ×2 IMPLANT
PENCIL SMOKE EVACUATOR (MISCELLANEOUS) ×2 IMPLANT
SEAL UNIV 5-12 XI (MISCELLANEOUS) ×8 IMPLANT
SET TUBE FILTERED XL AIRSEAL (SET/KITS/TRAYS/PACK) IMPLANT
SET TUBE SMOKE EVAC HIGH FLOW (TUBING) ×2 IMPLANT
SOL ELECTROSURG ANTI STICK (MISCELLANEOUS) ×2
SOLUTION ELECTROSURG ANTI STCK (MISCELLANEOUS) ×2 IMPLANT
SPIKE FLUID TRANSFER (MISCELLANEOUS) ×2 IMPLANT
SPONGE T-LAP 18X18 ~~LOC~~+RFID (SPONGE) IMPLANT
SPONGE T-LAP 4X18 ~~LOC~~+RFID (SPONGE) ×2 IMPLANT
SUT MNCRL AB 4-0 PS2 18 (SUTURE) ×2 IMPLANT
SUT VIC AB 2-0 SH 27 (SUTURE) ×2
SUT VIC AB 2-0 SH 27XBRD (SUTURE) IMPLANT
SUT VIC AB 3-0 SH 27 (SUTURE) ×2
SUT VIC AB 3-0 SH 27X BRD (SUTURE) IMPLANT
SUT VICRYL 0 UR6 27IN ABS (SUTURE) ×4 IMPLANT
SYS BAG RETRIEVAL 10MM (BASKET) ×2
SYSTEM BAG RETRIEVAL 10MM (BASKET) ×2 IMPLANT
WATER STERILE IRR 500ML POUR (IV SOLUTION) ×2 IMPLANT

## 2023-06-27 NOTE — Progress Notes (Signed)
Patient is doing very well in PACU. Pain is tolerable per patient at a 3 or 4 based on the pain scale 0-10. Denies N/V. VSS. Incisions clean dry intact x4 w/ dermabond. Pain medication last dosages updated on her AVS. Patient wants to go home rather than being admitted. Notified Dr. Aleen Campi of patient wishes.

## 2023-06-27 NOTE — Interval H&P Note (Signed)
History and Physical Interval Note:  06/27/2023 12:44 PM  Kristy Wright  has presented today for surgery, with the diagnosis of Cholecytitis.  The various methods of treatment have been discussed with the patient and family. After consideration of risks, benefits and other options for treatment, the patient has consented to  Procedure(s): XI ROBOTIC ASSISTED LAPAROSCOPIC CHOLECYSTECTOMY (N/A) INDOCYANINE GREEN FLUORESCENCE IMAGING (ICG) (N/A) as a surgical intervention.  The patient's history has been reviewed, patient examined, no change in status, stable for surgery.  I have reviewed the patient's chart and labs.  Questions were answered to the patient's satisfaction.     Esgar Barnick

## 2023-06-27 NOTE — Anesthesia Preprocedure Evaluation (Signed)
Anesthesia Evaluation  Patient identified by MRN, date of birth, ID band Patient awake    Reviewed: Allergy & Precautions, H&P , NPO status , Patient's Chart, lab work & pertinent test results, reviewed documented beta blocker date and time   Airway Mallampati: II  TM Distance: >3 FB Neck ROM: full    Dental  (+) Teeth Intact   Pulmonary shortness of breath and with exertion   Pulmonary exam normal        Cardiovascular Exercise Tolerance: Good negative cardio ROS Normal cardiovascular exam Rhythm:regular Rate:Normal     Neuro/Psych  Neuromuscular disease  negative psych ROS   GI/Hepatic Neg liver ROS,GERD  Medicated,,  Endo/Other  Hypothyroidism    Renal/GU negative Renal ROS  negative genitourinary   Musculoskeletal   Abdominal   Peds  Hematology negative hematology ROS (+)   Anesthesia Other Findings Past Medical History: No date: Carpal tunnel syndrome, bilateral     Comment:  s/p release 05-01-2022 No date: GERD (gastroesophageal reflux disease) No date: History of acute pyelonephritis     Comment:  ED visit in epic 04-02-2022 No date: Hypothyroidism     Comment:  followed by pcp   (last ultrasound in epic 11-11-2018 no              nodules) Past Surgical History: 05/07/2022: CARPAL TUNNEL RELEASE; Right     Comment:  Procedure: Right Carpal Tunnel Release;  Surgeon:               Gomez Cleverly, MD;  Location: [facility];              Service: Orthopedics;  Laterality: Right;  with local               anesthesia 07/16/2022: CARPAL TUNNEL RELEASE; Left     Comment:  Procedure: CARPAL TUNNEL RELEASE;  Surgeon: Gomez Cleverly, MD;  Location: [facility] [place];                Service: Orthopedics;  Laterality: Left;  with local               anesthesia BMI    Body Mass Index: 43.90 kg/m     Reproductive/Obstetrics negative OB ROS                              Anesthesia Physical Anesthesia Plan  ASA: 2  Anesthesia Plan: General ETT   Post-op Pain Management:    Induction:   PONV Risk Score and Plan: 4 or greater  Airway Management Planned:   Additional Equipment:   Intra-op Plan:   Post-operative Plan:   Informed Consent: I have reviewed the patients History and Physical, chart, labs and discussed the procedure including the risks, benefits and alternatives for the proposed anesthesia with the patient or authorized representative who has indicated his/her understanding and acceptance.     Dental Advisory Given  Plan Discussed with: CRNA  Anesthesia Plan Comments:        Anesthesia Quick Evaluation

## 2023-06-27 NOTE — ED Notes (Signed)
Surgeon at bedside.  

## 2023-06-27 NOTE — Transfer of Care (Signed)
Immediate Anesthesia Transfer of Care Note  Patient: Kristy Wright  Procedure(s) Performed: XI ROBOTIC ASSISTED LAPAROSCOPIC CHOLECYSTECTOMY INDOCYANINE GREEN FLUORESCENCE IMAGING (ICG) HERNIA REPAIR UMBILICAL ADULT (Abdomen)  Patient Location: PACU  Anesthesia Type:General  Level of Consciousness: awake, alert , and oriented  Airway & Oxygen Therapy: Patient Spontanous Breathing and Patient connected to face mask oxygen  Post-op Assessment: Report given to RN and Post -op Vital signs reviewed and stable  Post vital signs: stable  Last Vitals:  Vitals Value Taken Time  BP 137/68 06/27/23 1501  Temp    Pulse 84 06/27/23 1502  Resp 14 06/27/23 1504  SpO2 98 % 06/27/23 1502  Vitals shown include unfiled device data.  Last Pain:  Vitals:   06/27/23 1201  TempSrc: Temporal  PainSc: 7          Complications: No notable events documented.

## 2023-06-27 NOTE — H&P (Signed)
Date of Admission:  06/27/2023  Reason for Admission:  Acute cholecystitis  History of Present Illness: Kristy Wright is a 32 y.o. female presenting with acute onset of right upper quadrant abdominal pain associated with back pain and nausea and vomiting episodes at home.  The pain started at 3 AM this morning.  The patient reports that the pain woke her up from sleep and initially was more towards the back and then radiated towards the front in the right upper quadrant.  This started having multiple episodes of emesis at home.  She just had a vaginal delivery about 11 days ago she was worried there could be something wrong so she presented to emergency room right away.  She does not have a prior history of cholelithiasis.  Patient reports having some chills at home and diarrhea yesterday.  Denies any jaundice.  Denies any prior episodes like this during her pregnancy.  In the emergency room, her workup showed a normal white blood cell count of 7.2, with only mild elevation in AST of 51 and ALT of 72 with a normal total bilirubin 0.5 and alkaline phosphatase of 92.  She had an ultrasound of the right upper quadrant which showedSmall gallstones with a distended gallbladder, with mild pericholecystic fluid and gallbladder wall thickening of 4 mm.  Patient received a dose of IV Toradol and although it did help somewhat with her pain, she continues to have persistent pain at this point.  Past Medical History: Past Medical History:  Diagnosis Date   Carpal tunnel syndrome, bilateral    s/p release 05-01-2022   GERD (gastroesophageal reflux disease)    History of acute pyelonephritis    ED visit in epic 04-02-2022   Hypothyroidism    followed by pcp   (last ultrasound in epic 11-11-2018 no nodules)     Past Surgical History: Past Surgical History:  Procedure Laterality Date   CARPAL TUNNEL RELEASE Right 05/07/2022   Procedure: Right Carpal Tunnel Release;  Surgeon: Gomez Cleverly, MD;  Location:  [facility];  Service: Orthopedics;  Laterality: Right;  with local anesthesia   CARPAL TUNNEL RELEASE Left 07/16/2022   Procedure: CARPAL TUNNEL RELEASE;  Surgeon: Gomez Cleverly, MD;  Location: [facility];  Service: Orthopedics;  Laterality: Left;  with local anesthesia    Home Medications: Prior to Admission medications   Medication Sig Start Date End Date Taking? Authorizing Provider  Blood Pressure Monitoring (BLOOD PRESSURE CUFF) MISC This prescription is good for one blood pressure monitor with cuff. 04/11/23   Tobb, Kardie, DO  ibuprofen (ADVIL) 600 MG tablet Take 1 tablet (600 mg total) by mouth every 6 (six) hours. 06/17/23   Meisinger, Tawanna Cooler, MD  levothyroxine (SYNTHROID) 200 MCG tablet Take 1 tablet (200 mcg total) by mouth daily before breakfast. 01/15/23   Eden Emms, NP  meclizine (ANTIVERT) 25 MG tablet Take 1 tablet (25 mg total) by mouth 3 (three) times daily as needed for dizziness. 04/30/23   Raelyn Mora, CNM  Prenatal Vit-Fe Fumarate-FA (MULTIVITAMIN-PRENATAL) 27-0.8 MG TABS tablet Take 1 tablet by mouth daily at 12 noon.    [provider]    Allergies: No Known Allergies  Social History:  reports that she has never smoked. She has never used smokeless tobacco. She reports current alcohol use of about 2.0 - 3.0 standard drinks of alcohol per week. She reports that she does not use drugs.   Family History: Family History  Problem Relation Age of Onset  Liver disease Father    Hypertension Father    Diabetes Father    Thyroid disease Sister        uncertain type   Hypertension Sister    Thyroid disease Brother    Healthy Daughter    Healthy Son    Healthy Son    Healthy Son    Healthy Son    Diabetes Maternal Grandmother    Heart disease Maternal Grandmother    Hypertension Maternal Grandmother    Stroke Maternal Grandmother    Cancer Paternal Grandmother        breast   Hypertension Paternal Grandmother     Arthritis Paternal Grandmother    Thyroid disease Paternal Grandmother    Diabetes Paternal Grandfather    Hypertension Paternal Grandfather    Stroke Other     Review of Systems: Review of Systems  Constitutional:  Positive for chills. Negative for fever.  HENT:  Negative for hearing loss.   Respiratory:  Negative for shortness of breath.   Cardiovascular:  Negative for chest pain.  Gastrointestinal:  Positive for abdominal pain, diarrhea, nausea and vomiting.  Genitourinary:  Negative for dysuria.  Musculoskeletal:  Positive for back pain.  Skin:  Negative for rash.  Neurological:  Negative for dizziness.  Psychiatric/Behavioral:  Negative for depression.     Physical Exam BP 138/89   Pulse (!) 55   Temp 97.6 F (36.4 C) (Oral)   Resp (!) 22   Ht 5\' 2"  (1.575 m)   Wt 108.9 kg   LMP 09/07/2022   SpO2 98%   BMI 43.90 kg/m  CONSTITUTIONAL:  No acute distress HEENT:  Normocephalic, atraumatic, extraocular motion intact. NECK: Trachea is midline, and there is no jugular venous distension.  RESPIRATORY:  Normal respiratory effort without pathologic use of accessory muscles. CARDIOVASCULAR:  regular rhythm and rate. GI: The abdomen is  soft, nondistended, with localized tenderness in the right upper quadrant.  Equivocal Murphy sign as she has received pain medication. MUSCULOSKELETAL:  Normal muscle strength and tone in all four extremities.  No peripheral edema or cyanosis. SKIN: Skin turgor is normal. There are no pathologic skin lesions.  NEUROLOGIC:  Motor and sensation is grossly normal.  Cranial nerves are grossly intact. PSYCH:  Alert and oriented to person, place and time. Affect is normal.  Laboratory Analysis: Results for orders placed or performed during the hospital encounter of 06/27/23 (from the past 24 hour(s))  CBC     Status: None   Collection Time: 06/27/23  3:36 AM  Result Value Ref Range   WBC 7.2 4.0 - 10.5 K/uL   RBC 4.32 3.87 - 5.11 MIL/uL    Hemoglobin 12.5 12.0 - 15.0 g/dL   HCT 28.4 13.2 - 44.0 %   MCV 90.3 80.0 - 100.0 fL   MCH 28.9 26.0 - 34.0 pg   MCHC 32.1 30.0 - 36.0 g/dL   RDW 10.2 72.5 - 36.6 %   Platelets 295 150 - 400 K/uL   nRBC 0.0 0.0 - 0.2 %  Comprehensive metabolic panel     Status: Abnormal   Collection Time: 06/27/23  3:36 AM  Result Value Ref Range   Sodium 137 135 - 145 mmol/L   Potassium 3.9 3.5 - 5.1 mmol/L   Chloride 102 98 - 111 mmol/L   CO2 27 22 - 32 mmol/L   Glucose, Bld 99 70 - 99 mg/dL   BUN 19 6 - 20 mg/dL   Creatinine, Ser 4.40 0.44 - 1.00 mg/dL  Calcium 9.2 8.9 - 10.3 mg/dL   Total Protein 7.4 6.5 - 8.1 g/dL   Albumin 3.5 3.5 - 5.0 g/dL   AST 51 (H) 15 - 41 U/L   ALT 72 (H) 0 - 44 U/L   Alkaline Phosphatase 92 38 - 126 U/L   Total Bilirubin 0.5 0.3 - 1.2 mg/dL   GFR, Estimated >10 >27 mL/min   Anion gap 8 5 - 15  Lipase, blood     Status: None   Collection Time: 06/27/23  3:36 AM  Result Value Ref Range   Lipase 44 11 - 51 U/L    Imaging: US ABDOMEN LIMITED RUQ (LIVER/GB)  Result Date: 06/27/2023 CLINICAL DATA:  Right upper quadrant pain this morning EXAM: ULTRASOUND ABDOMEN LIMITED RIGHT UPPER QUADRANT COMPARISON:  Abdominal CT 04/02/2022 FINDINGS: Gallbladder: Small layering calculi. Full gallbladder with focal tenderness and mild pericholecystic fluid. Common bile duct: Diameter: 4 mm Liver: No focal lesion identified. Within normal limits in parenchymal echogenicity. Portal vein is patent on color Doppler imaging with normal direction of blood flow towards the liver. IMPRESSION: Cholelithiasis with gallbladder distension, focal tenderness, and pericholecystic fluid - possible cholecystitis/gallbladder obstruction. Electronically Signed   By: Tiburcio Pea M.D.   On: 06/27/2023 05:05    Assessment and Plan: This is a 32 y.o. female with acute cholecystitis.  - Discussed with the patient the findings on her labs and her ultrasound images.  Overall there is concern for acute  cholecystitis and her pain continues despite of IV pain medication.  I do agree that she has sign of early acute cholecystitis.  Discussed with her the typical management in the form of cholecystectomy, which can be done robotically or minimally invasive.  Reviewed the surgery at length with her including the planned incisions, the risks of bleeding, infection, injury to surrounding structures, the use of ICG to better evaluate the biliary anatomy, hospital stay, and she is willing to proceed. -- Patient will be admitted to the surgical team and be made NPO with IV fluid hydration, pain control, and IV antibiotics.  Will take her for surgery later today pending anesthesia/OR team availability.  If possible, may try to discharge home from PACU, but discussed with her that she may need to stay overnight depending on the intraoperative findings.  I spent 75 minutes dedicated to the care of this patient on the date of this encounter to include pre-visit review of records, face-to-face time with the patient discussing diagnosis and management, and any post-visit coordination of care.   Howie Ill, MD [facility]g:  [phone]

## 2023-06-27 NOTE — Anesthesia Procedure Notes (Signed)
Procedure Name: Intubation Date/Time: 06/27/2023 1:03 PM  Performed by: Rodney Booze, CRNAPre-anesthesia Checklist: Patient identified, Emergency Drugs available, Suction available and Patient being monitored Patient Re-evaluated:Patient Re-evaluated prior to induction Oxygen Delivery Method: Circle system utilized Preoxygenation: Pre-oxygenation with 100% oxygen Induction Type: IV induction and Rapid sequence Laryngoscope Size: McGraph and 3 Grade View: Grade I Tube type: Oral Tube size: 7.0 mm Number of attempts: 1 Airway Equipment and Method: Stylet and Oral airway Placement Confirmation: ETT inserted through vocal cords under direct vision, positive ETCO2 and breath sounds checked- equal and bilateral Secured at: 21 cm Tube secured with: Tape Dental Injury: Teeth and Oropharynx as per pre-operative assessment

## 2023-06-27 NOTE — Op Note (Signed)
Procedure Date:  06/27/2023  Pre-operative Diagnosis:  Acute cholecystitis  Post-operative Diagnosis: Acute cholecystitis and reducible umbilical hernia 2 cm.  Procedure:  Robotic assisted cholecystectomy with ICG FireFly cholangiogram; open umbilical hernia repair.  Surgeon:  Howie Ill, MD  Anesthesia:  General endotracheal  Estimated Blood Loss:  30 ml  Specimens:  gallbladder  Complications:  None  Indications for Procedure:  This is a 32 y.o. female who presents with abdominal pain and workup revealing acute cholecystitis.  The benefits, complications, treatment options, and expected outcomes were discussed with the patient. The risks of bleeding, infection, recurrence of symptoms, failure to resolve symptoms, bile duct damage, bile duct leak, retained common bile duct stone, bowel injury, and need for further procedures were all discussed with the patient and she was willing to proceed.  Description of Procedure: The patient was correctly identified in the preoperative area and brought into the operating room.  The patient was placed supine with VTE prophylaxis in place.  Appropriate time-outs were performed.  Anesthesia was induced and the patient was intubated.  Appropriate antibiotics were infused.  The abdomen was prepped and draped in a sterile fashion. She was noted to have an umbilical hernia, which was reducible.  An infraumbilical incision was made. Cautery was used to dissect down the umbilical stalk and to separate the stalk from the underlying fascia and hernia sac.  This revealed a 2 cm umbilical hernia defect.  The fascial edges were cleared with cautery.  Some bleeding from the preperitoneal fat was controlled with cautery.  A 12 mm robotic port was inserted.  Pneumoperitoneum was obtained with appropriate opening pressures.  Three 8-mm ports were placed in the mid abdomen at the level of the umbilicus under direct visualization.  The DaVinci platform was docked,  camera targeted, and instruments were placed under direct visualization.  The gallbladder was identified.  It was very edematous.  The fundus was grasped and retracted cephalad.  Adhesions were lysed bluntly and with electrocautery. The infundibulum was grasped and retracted laterally, exposing the peritoneum overlying the gallbladder.  This was incised with electrocautery and extended on either side of the gallbladder.  FireFly cholangiogram was then obtained, and we were able to clearly identify the cystic duct and common bile duct.  The cystic duct and cystic artery were carefully dissected with combination of cautery and blunt dissection.  Both were clipped twice proximally and once distally, cutting in between.  The gallbladder was taken from the gallbladder fossa in a retrograde fashion with electrocautery. There was mild bleeding from a posterior cystic artery, which was controlled with cautery/bipolar.  The gallbladder was placed in an Endocatch bag. The liver bed was inspected and any bleeding was controlled with electrocautery. The right upper quadrant was then inspected again revealing intact clips, no bleeding, and no ductal injury.  The area was thoroughly irrigated.  The 8 mm ports were removed under direct visualization and the 12 mm port was removed.  The Endocatch bag was brought out via the umbilical incision. The hernia defect was closed using 0 vicryl sutures.  Local anesthetic was infused in all incisions.  The stalk was reattached to the fascia using 2-0 Vicryl, and the umbilical incision was closed with 3-0 Vicryl and 4-0 Monocryl.  The other port incisions were closed with 4-0 Monocryl.  The wounds were cleaned and sealed with DermaBond.  The patient was emerged from anesthesia and extubated and brought to the recovery room for further management.  The patient tolerated  the procedure well and all counts were correct at the end of the case.   Howie Ill, MD

## 2023-06-27 NOTE — ED Provider Notes (Signed)
[facility] Provider Note    Event Date/Time   First MD Initiated Contact with Patient 06/27/23 [phone]     (approximate)   History   Chief Complaint: Abdominal Pain and Back Pain   HPI  Kristy Wright is a 32 y.o. female with past history of GERD, obesity, recently gave birth 2 weeks ago who comes ED complaining of right upper quadrant pain radiating to her back that started at about 3:00 AM, waking her up from sleep.  Associated with vomiting.  Pain is severe, no aggravating or alleviating factors.  Never had pain like this before.  Denies fever.     Physical Exam   Triage Vital Signs: ED Triage Vitals  Encounter Vitals Group     BP 06/27/23 0327 (!) 123/103     Systolic BP Percentile --      Diastolic BP Percentile --      Pulse Rate 06/27/23 0327 (!) 58     Resp 06/27/23 0327 (!) 22     Temp 06/27/23 0327 97.6 F (36.4 C)     Temp Source 06/27/23 0327 Oral     SpO2 06/27/23 0327 100 %     Weight 06/27/23 0328 240 lb (108.9 kg)     Height 06/27/23 0328 5\' 2"  (1.575 m)     Head Circumference --      Peak Flow --      Pain Score 06/27/23 0328 10     Pain Loc --      Pain Education --      Exclude from Growth Chart --     Most recent vital signs: Vitals:   06/27/23 0500 06/27/23 0515  BP:    Pulse: (!) 57 (!) 56  Resp:    Temp:    SpO2: 100% 99%    General: Awake, no distress.  CV:  Good peripheral perfusion.  Regular rate rhythm Resp:  Normal effort.  Clear to auscultation bilaterally Abd:  No distention.  Soft with right upper quadrant tenderness.  No CVA tenderness. Other:  Was drawn mucosa   ED Results / Procedures / Treatments   Labs (all labs ordered are listed, but only abnormal results are displayed) Labs Reviewed  COMPREHENSIVE METABOLIC PANEL - Abnormal; Notable for the following components:      Result Value   AST 51 (*)    ALT 72 (*)    All other components within normal limits  CBC  LIPASE, BLOOD  URINALYSIS,  ROUTINE W REFLEX MICROSCOPIC  HEPATIC FUNCTION PANEL  HIV ANTIBODY (ROUTINE TESTING W REFLEX)     EKG    RADIOLOGY Interpreted by me US shows gallstone and pericholecystitic fluid, radiology report reviewed   PROCEDURES:  Procedures   MEDICATIONS ORDERED IN ED: Medications  lactated ringers infusion ( Intravenous New Bag/Given 06/27/23 0604)  piperacillin-tazobactam (ZOSYN) IVPB 3.375 g (3.375 g Intravenous New Bag/Given 06/27/23 0610)  lactated ringers infusion (has no administration in time range)  acetaminophen (TYLENOL) tablet 1,000 mg (has no administration in time range)  HYDROmorphone (DILAUDID) injection 0.5 mg (has no administration in time range)  polyethylene glycol (MIRALAX / GLYCOLAX) packet 17 g (has no administration in time range)  ondansetron (ZOFRAN-ODT) disintegrating tablet 4 mg (has no administration in time range)    Or  ondansetron (ZOFRAN) injection 4 mg (has no administration in time range)  pantoprazole (PROTONIX) injection 40 mg (has no administration in time range)  piperacillin-tazobactam (ZOSYN) IVPB 3.375 g (has no administration in time  range)  indocyanine green (IC-GREEN) injection 2.5 mg (has no administration in time range)  sodium chloride 0.9 % bolus 1,000 mL (0 mLs Intravenous Stopped 06/27/23 0611)  ondansetron (ZOFRAN) injection 4 mg (4 mg Intravenous Given 06/27/23 0343)  ketorolac (TORADOL) 15 MG/ML injection 15 mg (15 mg Intravenous Given 06/27/23 0343)     IMPRESSION / MDM / ASSESSMENT AND PLAN / ED COURSE  I reviewed the triage vital signs and the nursing notes.  DDx: Gastritis, pancreatitis, cholecystitis, choledocholithiasis, biliary colic  Patient's presentation is most consistent with acute presentation with potential threat to life or bodily function.  Patient presents with upper abdominal pain, tenderness, vomiting.  Vital signs unremarkable, abdomen is nonacute.  Will obtain labs, ultrasound, give IV fluids Toradol Zofran for  symptom relief.   ----------------------------------------- 6:13 AM on 06/27/2023 ----------------------------------------- Ultrasound concerning for acute cholecystitis.  Patient still having pain and tenderness.  Discussed with surgery who will admit.  Zosyn ordered.      FINAL CLINICAL IMPRESSION(S) / ED DIAGNOSES   Final diagnoses:  Acute cholecystitis     Rx / DC Orders   ED Discharge Orders     None        Note:  This document was prepared using Dragon voice recognition software and may include unintentional dictation errors.   Sharman Cheek, MD 06/27/23 [phone]

## 2023-06-27 NOTE — ED Triage Notes (Signed)
Pt presents to ER with c/o upper abd pain and back pain that started appx 30 minutes ago with some associated n/v.  States pain "feels like my stomach is on fire."  Pt actively vomiting in triage.  Denies hx of gall bladder issues.  States she recently gave birth vaginally on 7/21.  Pt otherwise A&O x4 on arrival.

## 2023-06-27 NOTE — Discharge Instructions (Addendum)
In addition to included general post-operative instructions,  1.  Patient may shower, but do not scrub wounds heavily and dab dry only. 2.  Do not submerge wounds in pool/tub until fully healed. 3.  Do not apply ointments or hydrogen peroxide to the wounds. 4.  May apply ice packs to the wounds for comfort. 5.  If needing the oxycodone for pain control, would recommend "pump and dump" to decrease the amount of the medication in the breast milk.  Ibuprofen is OK to take while breast feeding without issues.  Diet: Resume home diet. Recommend avoiding or limiting fatty/greasy foods over the next few days/week. If you do eat these, you may (or may not) notice diarrhea. This is expected while your body adjusts to not having a gallbladder, and it typically resolves with time.    Activity: No heavy lifting >15 pounds (children, pets, laundry, garbage) or strenuous activity for 4 weeks, but light activity and walking are encouraged. Do not drive or drink alcohol if taking narcotic pain medications or having pain that might distract from driving.  Driving:  Do not drive while taking narcotics for pain control.  Prior to driving, make sure you are able to rotate right and left to look at blindspots without significant pain or discomfort.  Medications: Resume all home medications.  Call office [phone] / [phone]) at any time if any questions, worsening pain, fevers/chills, bleeding, drainage from incision site, or other concerns.  AMBULATORY SURGERY  DISCHARGE INSTRUCTIONS   The drugs that you were given will stay in your system until tomorrow so for the next 24 hours you should not:  Drive an automobile Make any legal decisions Drink any alcoholic beverage   You may resume regular meals tomorrow.  Today it is better to start with liquids and gradually work up to solid foods.  You may eat anything you prefer, but it is better to start with liquids, then soup and crackers, and gradually  work up to solid foods.   Please notify your doctor immediately if you have any unusual bleeding, trouble breathing, redness and pain at the surgery site, drainage, fever, or pain not relieved by medication.    Additional Instructions:        Please contact your physician with any problems or Same Day Surgery at [phone], Monday through Friday 6 am to 4 pm, or [place] at [facility] number at [phone].

## 2023-06-29 ENCOUNTER — Inpatient Hospital Stay (HOSPITAL_COMMUNITY): Payer: Medicaid Other

## 2023-06-30 ENCOUNTER — Encounter: Payer: Self-pay | Admitting: Surgery

## 2023-06-30 ENCOUNTER — Other Ambulatory Visit: Payer: Self-pay

## 2023-06-30 ENCOUNTER — Emergency Department: Payer: Medicaid Other

## 2023-06-30 DIAGNOSIS — R1084 Generalized abdominal pain: Secondary | ICD-10-CM | POA: Insufficient documentation

## 2023-06-30 DIAGNOSIS — R0600 Dyspnea, unspecified: Secondary | ICD-10-CM | POA: Insufficient documentation

## 2023-06-30 DIAGNOSIS — R079 Chest pain, unspecified: Secondary | ICD-10-CM | POA: Diagnosis not present

## 2023-06-30 DIAGNOSIS — R109 Unspecified abdominal pain: Secondary | ICD-10-CM | POA: Diagnosis not present

## 2023-06-30 DIAGNOSIS — Z9049 Acquired absence of other specified parts of digestive tract: Secondary | ICD-10-CM | POA: Diagnosis not present

## 2023-06-30 DIAGNOSIS — R0602 Shortness of breath: Secondary | ICD-10-CM | POA: Diagnosis not present

## 2023-06-30 DIAGNOSIS — R7989 Other specified abnormal findings of blood chemistry: Secondary | ICD-10-CM | POA: Diagnosis not present

## 2023-06-30 DIAGNOSIS — G8918 Other acute postprocedural pain: Secondary | ICD-10-CM | POA: Diagnosis not present

## 2023-06-30 DIAGNOSIS — I517 Cardiomegaly: Secondary | ICD-10-CM | POA: Diagnosis not present

## 2023-06-30 DIAGNOSIS — K449 Diaphragmatic hernia without obstruction or gangrene: Secondary | ICD-10-CM | POA: Diagnosis not present

## 2023-06-30 LAB — COMPREHENSIVE METABOLIC PANEL
ALT: 81 U/L — ABNORMAL HIGH (ref 0–44)
AST: 60 U/L — ABNORMAL HIGH (ref 15–41)
Albumin: 3.1 g/dL — ABNORMAL LOW (ref 3.5–5.0)
Alkaline Phosphatase: 88 U/L (ref 38–126)
Anion gap: 9 (ref 5–15)
BUN: 23 mg/dL — ABNORMAL HIGH (ref 6–20)
CO2: 26 mmol/L (ref 22–32)
Calcium: 8.8 mg/dL — ABNORMAL LOW (ref 8.9–10.3)
Chloride: 100 mmol/L (ref 98–111)
Creatinine, Ser: 0.83 mg/dL (ref 0.44–1.00)
GFR, Estimated: >60 mL/min (ref >60)
Glucose, Bld: 101 mg/dL — ABNORMAL HIGH (ref 70–99)
Potassium: 4 mmol/L (ref 3.5–5.1)
Sodium: 135 mmol/L (ref 135–145)
Total Bilirubin: 0.6 mg/dL (ref 0.3–1.2)
Total Protein: 6.6 g/dL (ref 6.5–8.1)

## 2023-06-30 LAB — TROPONIN I (HIGH SENSITIVITY): Troponin I (High Sensitivity): <2 ng/L (ref <18)

## 2023-06-30 LAB — CBC
HCT: 37.8 % (ref 36.0–46.0)
Hemoglobin: 12.2 g/dL (ref 12.0–15.0)
MCH: 29.5 pg (ref 26.0–34.0)
MCHC: 32.3 g/dL (ref 30.0–36.0)
MCV: 91.3 fL (ref 80.0–100.0)
Platelets: 261 10*3/uL (ref 150–400)
RBC: 4.14 MIL/uL (ref 3.87–5.11)
RDW: 12.6 % (ref 11.5–15.5)
WBC: 6.5 10*3/uL (ref 4.0–10.5)
nRBC: 0 % (ref 0.0–0.2)

## 2023-06-30 LAB — LIPASE, BLOOD: Lipase: 38 U/L (ref 11–51)

## 2023-06-30 LAB — D-DIMER, QUANTITATIVE: D-Dimer, Quant: 2.15 ug/mL-FEU — ABNORMAL HIGH (ref 0.00–0.50)

## 2023-06-30 NOTE — ED Triage Notes (Addendum)
Pt reports having hernia and lap chole on Friday. She is reporting that she is having shortness of breath that started around 1900, and upper right back pain that became worse with the shortness of breath. Nausea. Denies fevers. Pt had vaginal delivery on July 21st, no complications from delivery.

## 2023-06-30 NOTE — Anesthesia Postprocedure Evaluation (Signed)
Anesthesia Post Note  Patient: Kristy Wright  Procedure(s) Performed: XI ROBOTIC ASSISTED LAPAROSCOPIC CHOLECYSTECTOMY INDOCYANINE GREEN FLUORESCENCE IMAGING (ICG) HERNIA REPAIR UMBILICAL ADULT (Abdomen)  Patient location during evaluation: PACU Anesthesia Type: General Level of consciousness: awake and alert Pain management: pain level controlled Vital Signs Assessment: post-procedure vital signs reviewed and stable Respiratory status: spontaneous breathing, nonlabored ventilation, respiratory function stable and patient connected to nasal cannula oxygen Cardiovascular status: blood pressure returned to baseline and stable Postop Assessment: no apparent nausea or vomiting Anesthetic complications: no   No notable events documented.   Last Vitals:  Vitals:   06/27/23 1626 06/27/23 1749  BP: 123/79 120/64  Pulse: 60 60  Resp: 16 16  Temp: (!) 35.9 C (!) 36.2 C  SpO2: 98% 97%    Last Pain:  Vitals:   06/27/23 1749  TempSrc: Temporal  PainSc: 2                  Yevette Edwards

## 2023-07-01 ENCOUNTER — Emergency Department
Admission: EM | Admit: 2023-07-01 | Discharge: 2023-07-01 | Disposition: A | Discharge status: Home / Self Care | Payer: Medicaid Other | Attending: Emergency Medicine | Admitting: Emergency Medicine

## 2023-07-01 ENCOUNTER — Emergency Department: Payer: Medicaid Other

## 2023-07-01 DIAGNOSIS — R7989 Other specified abnormal findings of blood chemistry: Secondary | ICD-10-CM | POA: Diagnosis not present

## 2023-07-01 DIAGNOSIS — K449 Diaphragmatic hernia without obstruction or gangrene: Secondary | ICD-10-CM | POA: Diagnosis not present

## 2023-07-01 DIAGNOSIS — Z9049 Acquired absence of other specified parts of digestive tract: Secondary | ICD-10-CM | POA: Diagnosis not present

## 2023-07-01 DIAGNOSIS — R0600 Dyspnea, unspecified: Secondary | ICD-10-CM | POA: Diagnosis not present

## 2023-07-01 DIAGNOSIS — R109 Unspecified abdominal pain: Secondary | ICD-10-CM

## 2023-07-01 DIAGNOSIS — R0602 Shortness of breath: Secondary | ICD-10-CM

## 2023-07-01 DIAGNOSIS — I517 Cardiomegaly: Secondary | ICD-10-CM | POA: Diagnosis not present

## 2023-07-01 DIAGNOSIS — R1084 Generalized abdominal pain: Secondary | ICD-10-CM

## 2023-07-01 LAB — TROPONIN I (HIGH SENSITIVITY): Troponin I (High Sensitivity): 2 ng/L (ref <18)

## 2023-07-01 LAB — BRAIN NATRIURETIC PEPTIDE: B Natriuretic Peptide: 48.3 pg/mL (ref 0.0–100.0)

## 2023-07-01 MED ORDER — KETOROLAC TROMETHAMINE 30 MG/ML IJ SOLN
15.0000 mg | Freq: Once | INTRAMUSCULAR | Status: AC
  Administered 2023-07-01: 15 mg via INTRAVENOUS
  Filled 2023-07-01: qty 1

## 2023-07-01 MED ORDER — IOHEXOL 350 MG/ML SOLN
75.0000 mL | Freq: Once | INTRAVENOUS | Status: AC | PRN
  Administered 2023-07-01: 75 mL via INTRAVENOUS

## 2023-07-01 NOTE — ED Provider Notes (Signed)
[facility] Provider Note    Event Date/Time   First MD Initiated Contact with Patient 07/01/23 [phone]     (approximate)   History   Shortness of Breath   HPI  Kristy Wright is a 32 y.o. female who presents to the ED for evaluation of Shortness of Breath   Review obstetric DC summary from 7/23.  G6 P6 delivered at term vaginally, no complicating features at that time. Subsequently seen in the ED on 8/2 and diagnosed with acute cholecystitis and a lap chole was performed on 8/2.  Reducible umbilical hernia was also repaired.  Patient presents to the ED for evaluation of abdominal pain in the past few hours just tonight.  Reports that she has been sore since her surgery, but the pain seems somewhat worse tonight.  When the pain was worse she reports feeling dyspneic and short of breath, but this has improved and the pain remains.  Nauseous without emesis.  No fevers.  Still having some light vaginal bleeding from her delivery, but no worsening or new discharge or urinary symptoms.   Physical Exam   Triage Vital Signs: ED Triage Vitals [06/30/23 2249]  Encounter Vitals Group     BP 139/89     Systolic BP Percentile      Diastolic BP Percentile      Pulse Rate 80     Resp 18     Temp 98.8 F (37.1 C)     Temp Source Oral     SpO2 100 %     Weight      Height      Head Circumference      Peak Flow      Pain Score 7     Pain Loc      Pain Education      Exclude from Growth Chart     Most recent vital signs: Vitals:   07/01/23 0300 07/01/23 0330  BP: 137/77 (!) 92/59  Pulse: (!) 58 (!) 52  Resp:    Temp:    SpO2: 97% 98%    General: Awake, no distress.  CV:  Good peripheral perfusion.  Resp:  Normal effort.  Abd:  No distention.  Mild nonfocal tenderness without peritoneal features. MSK:  No deformity noted.  Neuro:  No focal deficits appreciated. Other:     ED Results / Procedures / Treatments   Labs (all labs ordered are  listed, but only abnormal results are displayed) Labs Reviewed  COMPREHENSIVE METABOLIC PANEL - Abnormal; Notable for the following components:      Result Value   Glucose, Bld 101 (*)    BUN 23 (*)    Calcium 8.8 (*)    Albumin 3.1 (*)    AST 60 (*)    ALT 81 (*)    All other components within normal limits  URINALYSIS, ROUTINE W REFLEX MICROSCOPIC - Abnormal; Notable for the following components:   Color, Urine YELLOW (*)    APPearance HAZY (*)    Specific Gravity, Urine >1.046 (*)    Hgb urine dipstick LARGE (*)    Protein, ur 30 (*)    Leukocytes,Ua TRACE (*)    All other components within normal limits  D-DIMER, QUANTITATIVE - Abnormal; Notable for the following components:   D-Dimer, Quant 2.15 (*)    All other components within normal limits  LIPASE, BLOOD  CBC  BRAIN NATRIURETIC PEPTIDE  POC URINE PREG, ED  TROPONIN I (HIGH SENSITIVITY)  TROPONIN  I (HIGH SENSITIVITY)    EKG Sinus rhythm with a rate of 79 bpm.  Rightward axis and incomplete bundle.  No STEMI.  Low amplitude.  Nonspecific ST changes  RADIOLOGY CXR interpreted by me without evidence of acute cardiopulmonary pathology.   Official radiology report(s): CT ABDOMEN PELVIS WO CONTRAST  Result Date: 07/01/2023 CLINICAL DATA:  Recent vaginal delivery and recent cholecystectomy, presenting with left-sided abdominal pain. EXAM: CT ABDOMEN AND PELVIS WITHOUT CONTRAST TECHNIQUE: Multidetector CT imaging of the abdomen and pelvis was performed following the standard protocol without IV contrast. RADIATION DOSE REDUCTION: This exam was performed according to the departmental dose-optimization program which includes automated exposure control, adjustment of the mA and/or kV according to patient size and/or use of iterative reconstruction technique. COMPARISON:  Apr 02, 2022 FINDINGS: Lower chest: No acute abnormality. Hepatobiliary: No focal liver abnormality is seen. Status post cholecystectomy. No biliary dilatation.  Pancreas: Unremarkable. No pancreatic ductal dilatation or surrounding inflammatory changes. Spleen: Normal in size without focal abnormality. Adrenals/Urinary Tract: Adrenal glands are unremarkable. Kidneys are normal, without renal calculi, focal lesion, or hydronephrosis. Contrast is seen throughout the bilateral renal collecting systems and an otherwise unremarkable urinary bladder. Stomach/Bowel: There is a small hiatal hernia. Appendix appears normal. No evidence of bowel wall thickening, distention, or inflammatory changes. Vascular/Lymphatic: No significant vascular findings are present. No enlarged abdominal or pelvic lymph nodes. Reproductive: The uterus is mildly enlarged and may be postpartum in nature. The bilateral adnexa are unremarkable. Other: A 1.9 cm x 1.6 cm x 1.7 cm collection of fluid and air is seen within the region of the umbilicus. A moderate amount of surrounding inflammatory fat stranding is noted. No abdominopelvic ascites. Musculoskeletal: No acute or significant osseous findings. IMPRESSION: 1. Small collection of fluid and air within the region of the umbilicus, as described above, likely postprocedural in origin. 2. Small hiatal hernia. 3. Evidence of prior cholecystectomy. Electronically Signed   By: Aram Candela M.D.   On: 07/01/2023 01:53   CT Angio Chest PE W and/or Wo Contrast  Result Date: 07/01/2023 CLINICAL DATA:  Recent abdominal surgery, dyspnea, positive D-dimer EXAM: CT ANGIOGRAPHY CHEST WITH CONTRAST TECHNIQUE: Multidetector CT imaging of the chest was performed using the standard protocol during bolus administration of intravenous contrast. Multiplanar CT image reconstructions and MIPs were obtained to evaluate the vascular anatomy. RADIATION DOSE REDUCTION: This exam was performed according to the departmental dose-optimization program which includes automated exposure control, adjustment of the mA and/or kV according to patient size and/or use of iterative  reconstruction technique. CONTRAST:  75mL OMNIPAQUE IOHEXOL 350 MG/ML SOLN COMPARISON:  None Available. FINDINGS: Cardiovascular: There is adequate opacification of the pulmonary arterial tree. No intraluminal filling defect is identified to suggest acute pulmonary embolism. The main pulmonary artery is enlarged measuring up to 3.8 cm in diameter in keeping with changes of pulmonary arterial hypertension. Cardiac size is mildly, globally enlarged. No significant coronary artery calcification. No pericardial effusion. The thoracic aorta is unremarkable. Mediastinum/Nodes: No enlarged mediastinal, hilar, or axillary lymph nodes. Thyroid gland, trachea, and esophagus demonstrate no significant findings. Lungs/Pleura: Lungs are clear. No pleural effusion or pneumothorax. Upper Abdomen: No acute abnormality. Musculoskeletal: No chest wall abnormality. No acute or significant osseous findings. Review of the MIP images confirms the above findings. IMPRESSION: 1. No pulmonary embolism. No acute intrathoracic pathology identified. 2. Mild global cardiomegaly. 3. Enlargement of the main pulmonary artery in keeping with changes of pulmonary arterial hypertension. These findings could be better assessed with echocardiography,  if indicated. Electronically Signed   By: Helyn Numbers M.D.   On: 07/01/2023 00:29   DG Chest 2 View  Result Date: 06/30/2023 CLINICAL DATA:  Chest pain and shortness of breath. EXAM: CHEST - 2 VIEW COMPARISON:  January 12, 2016 FINDINGS: The heart size and mediastinal contours are within normal limits. Both lungs are clear. The visualized skeletal structures are unremarkable. IMPRESSION: No active cardiopulmonary disease. Electronically Signed   By: Aram Candela M.D.   On: 06/30/2023 23:21    PROCEDURES and INTERVENTIONS:  .1-3 Lead EKG Interpretation  Performed by: Delton Prairie, MD Authorized by: Delton Prairie, MD     Interpretation: normal     ECG rate:  60   ECG rate assessment:  normal     Rhythm: sinus rhythm     Ectopy: none     Conduction: normal     Medications  iohexol (OMNIPAQUE) 350 MG/ML injection 75 mL (75 mLs Intravenous Contrast Given 07/01/23 0010)  ketorolac (TORADOL) 30 MG/ML injection 15 mg (15 mg Intravenous Given 07/01/23 0143)     IMPRESSION / MDM / ASSESSMENT AND PLAN / ED COURSE  I reviewed the triage vital signs and the nursing notes.  Differential diagnosis includes, but is not limited to, postoperative abscess or seroma, PE, new cardiomyopathy, ACS, choledocholithiasis  {Patient presents with symptoms of an acute illness or injury that is potentially life-threatening.  Patient with recent vaginal delivery and cholecystectomy presents with acute on subacute abdominal pain and an episode of dyspnea, with a fairly reassuring workup and suitable for trial of outpatient management.  Look systemically ill and has some mild tenderness but no peritoneal features or guarding on exam.  Her blood work is reassuring without evidence of leukocytosis, sepsis, hepatobiliary obstruction.  Normal lipase, BNP and troponins.  Urine with some blood, but she is having vaginal bleeding without urinary symptoms and I doubt this represents cystitis.  CTA without PE and CT abdomen/pelvis with a small periumbilical fluid collection, likely from her recent hernia repair.  Pain resolving with Toradol and I think she is suitable for outpatient management.  We discussed return precautions  Clinical Course as of 07/01/23 0405  Tue Jul 01, 2023  0321 Reassessed briefly much better after the Toradol and I reexamined her abdomen. [DS]  0321   We discussed reassuring CT abdomen/pelvis with postoperative periumbilical fluid collection that I suspect is normal in the timeframe of her recent umbilical hernia repair.  No urinary symptoms, but still having some vaginal bleeding from the delivery [DS]    Clinical Course User Index [DS] Delton Prairie, MD     FINAL CLINICAL  IMPRESSION(S) / ED DIAGNOSES   Final diagnoses:  Generalized abdominal pain  Shortness of breath  Postoperative abdominal pain     Rx / DC Orders   ED Discharge Orders     None        Note:  This document was prepared using Dragon voice recognition software and may include unintentional dictation errors.   Delton Prairie, MD 07/01/23 [phone]

## 2023-07-01 NOTE — Discharge Instructions (Addendum)
Please take Tylenol and ibuprofen/Advil for your pain.  It is safe to take them together, or to alternate them every few hours.  Take up to 1000mg of Tylenol at a time, up to 4 times per day.  Do not take more than 4000 mg of Tylenol in 24 hours.  For ibuprofen, take 400-600 mg, 3 - 4 times per day.  

## 2023-07-14 ENCOUNTER — Telehealth (HOSPITAL_COMMUNITY): Payer: Self-pay | Admitting: *Deleted

## 2023-07-14 NOTE — Telephone Encounter (Signed)
07/14/2023  Name: Kristy Wright MRN: [id number] DOB: Apr 12, 1991  Reason for Call:  Transition of Care Hospital Discharge Call  Contact Status: Patient Contact Status: Message  Language assistant needed:          Follow-Up Questions:    Inocente Salles Postnatal Depression Scale:  In the Past 7 Days:    PHQ2-9 Depression Scale:     Discharge Follow-up:    Post-discharge interventions: NA  Salena Saner, RN 07/14/2023 10:03

## 2023-07-15 ENCOUNTER — Encounter: Payer: Medicaid Other | Admitting: Physician Assistant

## 2023-07-25 ENCOUNTER — Ambulatory Visit: Payer: Medicaid Other | Admitting: Cardiology

## 2023-07-27 DIAGNOSIS — Z419 Encounter for procedure for purposes other than remedying health state, unspecified: Secondary | ICD-10-CM | POA: Diagnosis not present

## 2023-07-29 ENCOUNTER — Encounter: Payer: Self-pay | Admitting: Nurse Practitioner

## 2023-07-30 NOTE — Telephone Encounter (Signed)
Do you need to see in office for f/u before starting?

## 2023-07-30 NOTE — Telephone Encounter (Signed)
She will need an office visit 

## 2023-07-31 NOTE — Telephone Encounter (Signed)
Patient has been scheduled

## 2023-08-01 ENCOUNTER — Ambulatory Visit (INDEPENDENT_AMBULATORY_CARE_PROVIDER_SITE_OTHER): Payer: Medicaid Other | Admitting: Nurse Practitioner

## 2023-08-01 VITALS — BP 96/80 | HR 65 | Temp 97.6°F | Ht 62.0 in | Wt 227.4 lb

## 2023-08-01 DIAGNOSIS — Z3200 Encounter for pregnancy test, result unknown: Secondary | ICD-10-CM | POA: Insufficient documentation

## 2023-08-01 DIAGNOSIS — Z3041 Encounter for surveillance of contraceptive pills: Secondary | ICD-10-CM

## 2023-08-01 LAB — POCT URINE PREGNANCY: Preg Test, Ur: NEGATIVE

## 2023-08-01 MED ORDER — NORGESTIMATE-ETH ESTRADIOL 0.25-35 MG-MCG PO TABS: 1.0000 | ORAL_TABLET | Freq: Every day | ORAL | 1 refills | Status: DC

## 2023-08-01 NOTE — Assessment & Plan Note (Signed)
Pregnancy test negative in office.  Patient is not breast-feeding.  Start oral contraception.  Patient instructed to have backup contraception for 7 to 10 days after starting

## 2023-08-01 NOTE — Assessment & Plan Note (Signed)
Urine pregnancy test in office

## 2023-08-01 NOTE — Progress Notes (Signed)
Established Patient Office Visit  Subjective   Patient ID: Kristy Wright, female    DOB: 05/25/1991  Age: 32 y.o. MRN: [id number]  Chief Complaint  Patient presents with   Contraception    Pt would like to discuss birth control options/methods.     HPI  Contraception surveillance: Patient had a history of using Mirena in the past.  Patient reach out via MyChart and was interested in going back on some form of contraception.  She is here today for follow-up. States that she does have a history of normal periods. States 2 months post pardum and has had IUD in the past. She is interested in OCP. States that no breast cancer history. Had a cholecystectomy. No migrianes with aura.  She is not breast-feeding  Obesity: states that she is eating approx 2 meals a day. States that they are smaller. States that her appetitie isnt that great. States watching her diet because of her  States that she is at home and doing house. States that she does [facility] and go intot the office 2 days a week  States that she will drink water and zero calorie tea.  She is not breast feeding. Interested in weight loss injection.  Did inform patient that some of the GLP-1 medications can decrease effectiveness of birth control.    Review of Systems  Constitutional:  Negative for chills and fever.  Respiratory:  Negative for shortness of breath.   Cardiovascular:  Negative for chest pain.      Objective:     BP 96/80   Pulse 65   Temp 97.6 F (36.4 C) (Temporal)   Ht 5\' 2"  (1.575 m)   Wt 227 lb 6.4 oz (103.1 kg)   LMP 09/07/2022   SpO2 95%   Breastfeeding No   BMI 41.59 kg/m  BP Readings from Last 3 Encounters:  08/01/23 96/80  07/01/23 (!) 92/59  06/27/23 120/64   Wt Readings from Last 3 Encounters:  08/01/23 227 lb 6.4 oz (103.1 kg)  06/27/23 240 lb (108.9 kg)  06/15/23 244 lb 14.4 oz (111.1 kg)   SpO2 Readings from Last 3 Encounters:  08/01/23 95%  07/01/23 98%  06/27/23 97%       Physical Exam Vitals and nursing note reviewed.  Constitutional:      Appearance: Normal appearance.  Cardiovascular:     Rate and Rhythm: Normal rate and regular rhythm.     Heart sounds: Normal heart sounds.  Pulmonary:     Effort: Pulmonary effort is normal.     Breath sounds: Normal breath sounds.  Neurological:     Mental Status: She is alert.      Results for orders placed or performed in visit on 08/01/23  POCT urine pregnancy  Result Value Ref Range   Preg Test, Ur Negative Negative      The ASCVD Risk score (Arnett DK, et al., 2019) failed to calculate for the following reasons:   The 2019 ASCVD risk score is only valid for ages 82 to 42    Assessment & Plan:   Problem List Items Addressed This Visit       Other   Morbid obesity [facility])    Patient is interested in medical weight loss.  Patient is a good candidate for [facility].  She is wanting to be on oral contraception.  Discussed the possibility of Wegovy causing decreased birth control effectiveness.  Patient will start with the oral contraception and when she gets IUD in  place she will reach out and we can try a GLP-1 receptor agonist      Surveillance for birth control, oral contraceptives    Pregnancy test negative in office.  Patient is not breast-feeding.  Start oral contraception.  Patient instructed to have backup contraception for 7 to 10 days after starting      Relevant Medications   norgestimate-ethinyl estradiol (SPRINTEC 28) 0.25-35 MG-MCG tablet   Other Relevant Orders   Ambulatory referral to Gynecology   Possible pregnancy - Primary    Urine pregnancy test in office      Relevant Orders   POCT urine pregnancy (Completed)    Return if symptoms worsen or fail to improve.    Audria Nine, NP

## 2023-08-01 NOTE — Assessment & Plan Note (Signed)
Patient is interested in medical weight loss.  Patient is a good candidate for [facility].  She is wanting to be on oral contraception.  Discussed the possibility of Wegovy causing decreased birth control effectiveness.  Patient will start with the oral contraception and when she gets IUD in place she will reach out and we can try a GLP-1 receptor agonist

## 2023-08-01 NOTE — Patient Instructions (Addendum)
Nice to see you today We will start on the birth control pills Use a back up method of birth control for the next 10 days I have referred you to GYN for IUD consultation

## 2023-08-05 ENCOUNTER — Encounter: Payer: Self-pay | Admitting: Nurse Practitioner

## 2023-08-06 DIAGNOSIS — Z30430 Encounter for insertion of intrauterine contraceptive device: Secondary | ICD-10-CM | POA: Diagnosis not present

## 2023-08-06 DIAGNOSIS — Z1389 Encounter for screening for other disorder: Secondary | ICD-10-CM | POA: Diagnosis not present

## 2023-08-06 DIAGNOSIS — Z3009 Encounter for other general counseling and advice on contraception: Secondary | ICD-10-CM | POA: Diagnosis not present

## 2023-08-06 MED ORDER — PHENTERMINE HCL 15 MG PO CAPS
15.0000 mg | ORAL_CAPSULE | ORAL | 0 refills | Status: AC
Start: 2023-08-06 — End: ?

## 2023-08-26 DIAGNOSIS — Z419 Encounter for procedure for purposes other than remedying health state, unspecified: Secondary | ICD-10-CM | POA: Diagnosis not present

## 2023-08-28 ENCOUNTER — Other Ambulatory Visit: Payer: Self-pay | Admitting: Nurse Practitioner

## 2023-08-28 DIAGNOSIS — E039 Hypothyroidism, unspecified: Secondary | ICD-10-CM

## 2023-09-16 ENCOUNTER — Encounter: Payer: Self-pay | Admitting: Nurse Practitioner

## 2023-09-16 DIAGNOSIS — E781 Pure hyperglyceridemia: Secondary | ICD-10-CM

## 2023-09-17 MED ORDER — WEGOVY 0.25 MG/0.5ML ~~LOC~~ SOAJ
0.2500 mg | SUBCUTANEOUS | 0 refills | Status: DC
Start: 2023-09-17 — End: 2023-10-27

## 2023-09-23 ENCOUNTER — Other Ambulatory Visit (HOSPITAL_COMMUNITY): Payer: Self-pay

## 2023-09-23 ENCOUNTER — Telehealth: Payer: Self-pay | Admitting: Pharmacist

## 2023-09-23 NOTE — Telephone Encounter (Signed)
Pharmacy Patient Advocate Encounter  Received notification from [facility] that Prior Authorization for [facility] 0.25MG /0.5ML auto-injectors has been APPROVED from 09/23/2023 to 03/21/2024. Ran test claim, Copay is $4.00. This test claim was processed through [facility]- copay amounts may vary at other pharmacies due to pharmacy/plan contracts, or as the patient moves through the different stages of their insurance plan.   PA #/Case ID/Reference #: [id number]  Pharmacy contacted

## 2023-09-23 NOTE — Telephone Encounter (Signed)
Pharmacy Patient Advocate Encounter   Received notification from Patient Pharmacy that prior authorization for [facility] 0.25MG /0.5ML auto-injectors is required/requested.   Insurance verification completed.   The patient is insured through [employer] .   Per test claim: PA required; PA submitted to [facility] via CoverMyMeds Key/confirmation #/EOC [facility] Status is pending

## 2023-09-26 DIAGNOSIS — Z419 Encounter for procedure for purposes other than remedying health state, unspecified: Secondary | ICD-10-CM | POA: Diagnosis not present

## 2023-10-24 ENCOUNTER — Other Ambulatory Visit: Payer: Self-pay | Admitting: Nurse Practitioner

## 2023-10-24 DIAGNOSIS — E781 Pure hyperglyceridemia: Secondary | ICD-10-CM

## 2023-10-26 DIAGNOSIS — Z419 Encounter for procedure for purposes other than remedying health state, unspecified: Secondary | ICD-10-CM | POA: Diagnosis not present

## 2023-10-27 NOTE — Telephone Encounter (Signed)
We can see if zepbound is covered since she is having ADE with wegovy

## 2023-10-27 NOTE — Telephone Encounter (Signed)
Can we see if she is ready to go up to the next dose of 0.5mg 

## 2023-10-27 NOTE — Telephone Encounter (Signed)
Called patient she states she has been getting very bad headaches with each 0.25mg  injection that last 3-4 days. She is wanting to know if there is another option of weight loss medication that she can try.

## 2023-10-27 NOTE — Telephone Encounter (Signed)
Called and spoke with patient, advised new rx was being sent in. She is agreeable to trying either.  Message said Zepbound but Greggory Keen was sent in, just wanted to clarify correct rx was sent in.

## 2023-10-28 MED ORDER — TIRZEPATIDE-WEIGHT MANAGEMENT 2.5 MG/0.5ML ~~LOC~~ SOLN
2.5000 mg | SUBCUTANEOUS | 0 refills | Status: DC
Start: 2023-10-28 — End: 2024-01-07

## 2023-10-28 NOTE — Addendum Note (Signed)
Addended by: Eden Emms on: 10/28/2023 12:30 PM   Modules accepted: Orders

## 2023-11-10 ENCOUNTER — Encounter: Payer: Self-pay | Admitting: Nurse Practitioner

## 2023-11-11 NOTE — Telephone Encounter (Signed)
Please run PA for Zepbound

## 2023-11-12 ENCOUNTER — Other Ambulatory Visit (HOSPITAL_COMMUNITY): Payer: Self-pay

## 2023-11-12 ENCOUNTER — Telehealth: Payer: Self-pay

## 2023-11-12 NOTE — Telephone Encounter (Signed)
Pharmacy Patient Advocate Encounter   Received notification from Patient Advice Request messages that prior authorization for Zepbound 2.5MG /0.5ML solution is required/requested.   Insurance verification completed.   The patient is insured through [facility] .   Per test claim: PA required; PA submitted to above mentioned insurance via CoverMyMeds Key/confirmation #/EOC [id number] Status is pending

## 2023-11-12 NOTE — Telephone Encounter (Signed)
Prior Berkley Harvey was approved but it was approved for the vials which the pharmacy does not carry. The pharmacy said the prescription needs to be sent in as pens. Also we are trying to submit the prior auth for the pens and it is requesting the patients baseline weight within the past 45 days before the prior auth can be submitted. The last weight and bmi we have documented is from 08/06/23 which is outside of that 45 day window.   Key: [id number]

## 2023-11-12 NOTE — Telephone Encounter (Signed)
Contacted pt and relayed information. Pt stated that she will contact local pharmacies that do have the vials In stock.

## 2023-11-20 ENCOUNTER — Other Ambulatory Visit (HOSPITAL_COMMUNITY): Payer: Self-pay

## 2023-11-24 NOTE — Telephone Encounter (Signed)
I do not think that local pharmacies have the vials. There are 2 orders in the computer. We may need to reorder it as the pen.   RX team - will this PA work for that or would another need to be submitted?

## 2023-11-24 NOTE — Telephone Encounter (Signed)
I spoke with pt  and she is coming in for wt check on 11/25/23 at 8:30 pt has not been on zepbound before. Sending note to Iven Finn NP as Lorain Childes.

## 2023-11-25 ENCOUNTER — Ambulatory Visit: Payer: Medicaid Other

## 2023-11-25 ENCOUNTER — Other Ambulatory Visit (HOSPITAL_COMMUNITY): Payer: Self-pay

## 2023-11-25 NOTE — Telephone Encounter (Signed)
 Pt came in for wt ck Wt 241.6 Ht 5'6". Sending to [facility] CMA.

## 2023-11-26 DIAGNOSIS — Z419 Encounter for procedure for purposes other than remedying health state, unspecified: Secondary | ICD-10-CM | POA: Diagnosis not present

## 2023-11-28 NOTE — Telephone Encounter (Signed)
 See my chart message

## 2023-12-03 ENCOUNTER — Telehealth: Payer: Self-pay

## 2023-12-03 NOTE — Telephone Encounter (Signed)
 Pt contacted office via mychart for an update on zepbound PA.

## 2023-12-03 NOTE — Telephone Encounter (Signed)
 Contacted pt regarding zepbound through [employer].

## 2023-12-04 ENCOUNTER — Other Ambulatory Visit (HOSPITAL_COMMUNITY): Payer: Self-pay

## 2023-12-04 ENCOUNTER — Encounter: Payer: Self-pay | Admitting: Nurse Practitioner

## 2023-12-04 NOTE — Telephone Encounter (Signed)
 What is the status on the zepbound PA?

## 2023-12-10 ENCOUNTER — Telehealth: Payer: Self-pay

## 2023-12-10 ENCOUNTER — Other Ambulatory Visit (HOSPITAL_COMMUNITY): Payer: Self-pay

## 2023-12-10 NOTE — Telephone Encounter (Signed)
 Pharmacy Patient Advocate Encounter   Received notification from Pt Calls Messages that prior authorization for Zepbound  2.5MG /0.5ML pen-injectors is required/requested.   Insurance verification completed.   The patient is insured through [facility] .   Per test claim: PA required; PA submitted to above mentioned insurance via CoverMyMeds Key/confirmation #/EOC [id number] Status is pending

## 2023-12-10 NOTE — Telephone Encounter (Signed)
Updated pt via mychart

## 2023-12-10 NOTE — Telephone Encounter (Signed)
 PA request has been Submitted. New Encounter created for follow up. For additional info see Pharmacy Prior Auth telephone encounter from 12/10/2023.

## 2023-12-12 ENCOUNTER — Other Ambulatory Visit (HOSPITAL_COMMUNITY): Payer: Self-pay

## 2023-12-12 NOTE — Telephone Encounter (Signed)
Contacted pt to inform her of approved PA for Zepbound. Pt verbalized understanding and has no concerns.

## 2023-12-12 NOTE — Telephone Encounter (Signed)
Patient was contacted in TE today about medication approval.

## 2023-12-12 NOTE — Telephone Encounter (Signed)
Pharmacy Patient Advocate Encounter  Received notification from [facility] that Prior Authorization for Zepbound 2.5MG /0.5ML has been APPROVED from 12/10/2023 to 06/07/2024. Ran test claim, Copay is $4.00. This test claim was processed through [facility]- copay amounts may vary at other pharmacies due to pharmacy/plan contracts, or as the patient moves through the different stages of their insurance plan.   PA #/Case ID/Reference #: [id number]

## 2023-12-12 NOTE — Telephone Encounter (Signed)
Zepbound has been approved for $4.00

## 2023-12-27 DIAGNOSIS — Z419 Encounter for procedure for purposes other than remedying health state, unspecified: Secondary | ICD-10-CM | POA: Diagnosis not present

## 2024-01-05 ENCOUNTER — Other Ambulatory Visit: Payer: Self-pay | Admitting: Nurse Practitioner

## 2024-01-05 DIAGNOSIS — E781 Pure hyperglyceridemia: Secondary | ICD-10-CM

## 2024-01-06 NOTE — Telephone Encounter (Signed)
Can we see if she is ready to go to 5mg  or has she been off the 2.5 for a month now?

## 2024-01-07 ENCOUNTER — Other Ambulatory Visit: Payer: Self-pay | Admitting: Nurse Practitioner

## 2024-01-07 DIAGNOSIS — E781 Pure hyperglyceridemia: Secondary | ICD-10-CM

## 2024-01-07 MED ORDER — ZEPBOUND 5 MG/0.5ML ~~LOC~~ SOAJ
5.0000 mg | SUBCUTANEOUS | 0 refills | Status: DC
Start: 2024-01-07 — End: 2024-01-09

## 2024-01-09 ENCOUNTER — Encounter: Payer: Self-pay | Admitting: Nurse Practitioner

## 2024-01-09 DIAGNOSIS — E781 Pure hyperglyceridemia: Secondary | ICD-10-CM

## 2024-01-09 MED ORDER — ZEPBOUND 7.5 MG/0.5ML ~~LOC~~ SOAJ: 7.5000 mg | SUBCUTANEOUS | 0 refills | Status: DC

## 2024-01-12 MED ORDER — ZEPBOUND 5 MG/0.5ML ~~LOC~~ SOAJ: 5.0000 mg | SUBCUTANEOUS | 0 refills | Status: DC

## 2024-01-12 NOTE — Addendum Note (Signed)
 Addended by: Eden Emms on: 01/12/2024 01:55 PM   Modules accepted: Orders

## 2024-01-24 DIAGNOSIS — Z419 Encounter for procedure for purposes other than remedying health state, unspecified: Secondary | ICD-10-CM | POA: Diagnosis not present

## 2024-02-23 DIAGNOSIS — G5603 Carpal tunnel syndrome, bilateral upper limbs: Secondary | ICD-10-CM | POA: Diagnosis not present

## 2024-03-06 DIAGNOSIS — Z419 Encounter for procedure for purposes other than remedying health state, unspecified: Secondary | ICD-10-CM | POA: Diagnosis not present

## 2024-03-12 ENCOUNTER — Other Ambulatory Visit: Payer: Self-pay | Admitting: Nurse Practitioner

## 2024-03-12 DIAGNOSIS — E039 Hypothyroidism, unspecified: Secondary | ICD-10-CM

## 2024-03-15 NOTE — Telephone Encounter (Signed)
 Can we schedule the patient for a CPE in the next 30-60 days to continue getting refills

## 2024-03-15 NOTE — Telephone Encounter (Signed)
 Patient scheduled.

## 2024-03-25 ENCOUNTER — Encounter: Payer: Self-pay | Admitting: Nurse Practitioner

## 2024-03-25 NOTE — Telephone Encounter (Signed)
 Patient request increase in medication.

## 2024-03-26 MED ORDER — ZEPBOUND 10 MG/0.5ML ~~LOC~~ SOAJ: 10.0000 mg | SUBCUTANEOUS | 0 refills | Status: DC

## 2024-03-26 NOTE — Telephone Encounter (Signed)
 Needs office visit within 3 weeks

## 2024-04-05 DIAGNOSIS — Z419 Encounter for procedure for purposes other than remedying health state, unspecified: Secondary | ICD-10-CM | POA: Diagnosis not present

## 2024-04-16 ENCOUNTER — Ambulatory Visit: Admitting: Nurse Practitioner

## 2024-05-06 DIAGNOSIS — Z419 Encounter for procedure for purposes other than remedying health state, unspecified: Secondary | ICD-10-CM | POA: Diagnosis not present

## 2024-05-10 ENCOUNTER — Telehealth (INDEPENDENT_AMBULATORY_CARE_PROVIDER_SITE_OTHER): Admitting: Nurse Practitioner

## 2024-05-10 ENCOUNTER — Ambulatory Visit: Admitting: Nurse Practitioner

## 2024-05-10 DIAGNOSIS — R110 Nausea: Secondary | ICD-10-CM

## 2024-05-10 MED ORDER — ONDANSETRON 4 MG PO TBDP: 4.0000 mg | ORAL_TABLET | Freq: Three times a day (TID) | ORAL | 0 refills | Status: DC | PRN

## 2024-05-10 MED ORDER — ZEPBOUND 10 MG/0.5ML ~~LOC~~ SOAJ: 10.0000 mg | SUBCUTANEOUS | 0 refills | Status: DC

## 2024-05-10 NOTE — Assessment & Plan Note (Signed)
 Continue Zepbound  10 mg weekly.  Patient has not weighed herself in a few weeks does not have a scale at home.  Will have her come in office later this month for a full set of vitals and weight with her CPE.  Continue working on healthy lifestyle modifications inclusive of diet and exercise.

## 2024-05-10 NOTE — Progress Notes (Deleted)
   Established Patient Office Visit  Subjective   Patient ID: LIANETTE BROUSSARD, female    DOB: [birth year]  Age: 33 y.o. MRN: [id number]  No chief complaint on file.   HPI  Obesity: Patient was last seen by me on 08/01/2019 for.  We did have a discussion back-and-forth about medication.  Patient was started on Zepbound  on 10/28/2023.  There was a lapse in medication and patient was restarted in February.  Patient currently on 10 mg of Zepbound  weekly.  She is here for follow-up.  Patient's weight in September 2024 was 227 pounds.  Patient's highest weight was 244 pounds.  {History (Optional):23778}  ROS    Objective:     There were no vitals taken for this visit. {Vitals History (Optional):23777}  Physical Exam   No results found for any visits on 05/10/24.  {Labs (Optional):23779}  The ASCVD Risk score (Arnett DK, et al., 2019) failed to calculate for the following reasons:   The 2019 ASCVD risk score is only valid for ages 61 to 46    Assessment & Plan:   Problem List Items Addressed This Visit   None   No follow-ups on file.    Margarie Shay, NP

## 2024-05-10 NOTE — Progress Notes (Signed)
 Ph: [phone] Fax: [phone]   Patient ID: Kristy Wright, female    DOB: Feb 17, 1991, 33 y.o.   MRN: [id number]  Virtual visit completed through MyChart, a video enabled telemedicine application. Due to national recommendations of social distancing due to COVID-19, a virtual visit is felt to be most appropriate for this patient at this time. Reviewed limitations, risks, security and privacy concerns of performing a virtual visit and the availability of in person appointments. I also reviewed that there may be a patient responsible charge related to this service. The patient agreed to proceed.   Patient location: home Provider location: [place] [facility], office Persons participating in this virtual visit: patient, provider   If any vitals were documented, they were collected by patient at home unless specified below.    Wt 228 lb (103.4 kg) Comment: per patient  BMI 41.70 kg/m    CC: weight loss Subjective:   HPI: Kristy Wright is a 33 y.o. female presenting on 05/10/2024 for Medication Management (Zepbound . Pt complains of slight nausea from medication and would zofran  to help. )   Obesity: Patient was last seen by me on 08/01/2019 for.  We did have a discussion back-and-forth about medication.  Patient was started on Zepbound  on 10/28/2023.  There was a lapse in medication and patient was restarted in February.  Patient currently on 10 mg of Zepbound  weekly.  She is here for follow-up.  Patient's weight in September 2024 was 227 pounds.  Patient's highest weight was 244 pounds.  States that her weight  was 228 approx 2 weeks ago.  States that she felt well on the 10mg . States that she did not feel like the 7.5 helped States that she is drinking protein drink in the am and a meal for lunch. She will have small protein snacks. She will do bars, shakes, fruit and chicken  Exercise: states that she is cleaning the house.   States that she is having nausea with the  medications. Statse that she is having when she takes the shot and prior to the next injection  She pooping every other day. States that she has had a hard stool   She is drinking water or mylo tea that is zeros sugar. She is having 6 bottles of water      Relevant past medical, surgical, family and social history reviewed and updated as indicated. Interim medical history since our last visit reviewed. Allergies and medications reviewed and updated. Outpatient Medications Prior to Visit  Medication Sig Dispense Refill   levonorgestrel  (MIRENA , 52 MG,) 20 MCG/DAY IUD Device supplied by care center.     levothyroxine  (SYNTHROID ) 200 MCG tablet TAKE 1 TABLET BY MOUTH DAILY BEFORE BREAKFAST. 90 tablet 0   norgestimate -ethinyl estradiol  (SPRINTEC 28) 0.25-35 MG-MCG tablet Take 1 tablet by mouth daily. 84 tablet 1   phentermine  15 MG capsule Take 1 capsule (15 mg total) by mouth every morning. 30 capsule 0   tirzepatide  (ZEPBOUND ) 10 MG/0.5ML Pen Inject 10 mg into the skin once a week. 2 mL 0   acetaminophen  (TYLENOL ) 500 MG tablet Take 2 tablets (1,000 mg total) by mouth every 6 (six) hours as needed for mild pain. (Patient not taking: Reported on 05/10/2024)     Blood Pressure Monitoring (BLOOD PRESSURE CUFF) MISC This prescription is good for one blood pressure monitor with cuff. (Patient not taking: Reported on 05/10/2024) 1 each 0   ibuprofen  (ADVIL ) 600 MG tablet Take 1 tablet (600 mg total)  by mouth every 8 (eight) hours as needed for moderate pain. (Patient not taking: Reported on 05/10/2024) 60 tablet 1   meclizine  (ANTIVERT ) 25 MG tablet Take 1 tablet (25 mg total) by mouth 3 (three) times daily as needed for dizziness. (Patient not taking: Reported on 05/10/2024) 30 tablet 0   oxyCODONE  (OXY IR/ROXICODONE ) 5 MG immediate release tablet Take 1 tablet (5 mg total) by mouth every 4 (four) hours as needed for severe pain. (Patient not taking: Reported on 05/10/2024) 30 tablet 0   Prenatal Vit-Fe  Fumarate-FA (MULTIVITAMIN-PRENATAL) 27-0.8 MG TABS tablet Take 1 tablet by mouth daily at 12 noon. (Patient not taking: Reported on 05/10/2024)     No facility-administered medications prior to visit.     Per HPI unless specifically indicated in ROS section below Review of Systems Objective:  Wt 228 lb (103.4 kg) Comment: per patient  BMI 41.70 kg/m   Wt Readings from Last 3 Encounters:  05/10/24 228 lb (103.4 kg)  08/01/23 227 lb 6.4 oz (103.1 kg)  06/27/23 240 lb (108.9 kg)       Physical exam: Gen: alert, NAD, not ill appearing Pulm: speaks in complete sentences without increased work of breathing Psych: normal mood, normal thought content      Results for orders placed or performed in visit on 08/01/23  POCT urine pregnancy   Collection Time: 08/01/23  9:28 AM  Result Value Ref Range   Preg Test, Ur Negative Negative   Assessment & Plan:   Morbid obesity (HCC) Assessment & Plan: Continue Zepbound  10 mg weekly.  Patient has not weighed herself in a few weeks does not have a scale at home.  Will have her come in office later this month for a full set of vitals and weight with her CPE.  Continue working on healthy lifestyle modifications inclusive of diet and exercise.  Orders: -     Zepbound ; Inject 10 mg into the skin once a week.  Dispense: 2 mL; Refill: 0  Nausea Assessment & Plan: Patient having nausea secondary to GLP-1 receptor agonist use.  Will do Zofran  4 mg ODT 3 times daily as needed.  Orders: -     Ondansetron ; Take 1 tablet (4 mg total) by mouth every 8 (eight) hours as needed.  Dispense: 20 tablet; Refill: 0     I discussed the assessment and treatment plan with the patient. The patient was provided an opportunity to ask questions and all were answered. The patient agreed with the plan and demonstrated an understanding of the instructions. The patient was advised to call back or seek an in-person evaluation if the symptoms worsen or if the condition  fails to improve as anticipated.  Follow up plan: Return if symptoms worsen or fail to improve, for As scheduled for CPE.  Kristy Shay, NP

## 2024-05-10 NOTE — Assessment & Plan Note (Signed)
 Patient having nausea secondary to GLP-1 receptor agonist use.  Will do Zofran  4 mg ODT 3 times daily as needed.

## 2024-05-19 ENCOUNTER — Encounter: Admitting: Nurse Practitioner

## 2024-05-19 NOTE — Progress Notes (Deleted)
   Established Patient Office Visit  Subjective   Patient ID: Kristy Wright, female    DOB: 06-08-1991  Age: 33 y.o. MRN: [id number]  No chief complaint on file.   HPI  Obesity: Patient currently maintained on her Ozempic at 10 mg weekly.  Patient is on birth control with IUD in place  Hypothyroidism: Patient currently maintained on levothyroxine  200 mcg daily.  for complete physical and follow up of chronic conditions.  Immunizations: -Tetanus: Completed in 2024 -Influenza: Out of season -Shingles: Too young -Pneumonia: Too young  Diet: Fair diet.  Exercise: No regular exercise.  Eye exam: Completes annually  Dental exam: Completes semi-annually    Colonoscopy: Too young, currently average risk Lung Cancer Screening: N/A  Pap smear:?  Mammogram: Too young, currently average risk paternal grandmother did have breast cancer  Dexa: Too young  Sleep:     {History (Optional):23778}  ROS    Objective:     There were no vitals taken for this visit. {Vitals History (Optional):23777}  Physical Exam   No results found for any visits on 05/19/24.  {Labs (Optional):23779}  The ASCVD Risk score (Arnett DK, et al., 2019) failed to calculate for the following reasons:   The 2019 ASCVD risk score is only valid for ages 69 to 57    Assessment & Plan:   Problem List Items Addressed This Visit   None   No follow-ups on file.    Adina Crandall, NP

## 2024-05-26 ENCOUNTER — Other Ambulatory Visit (HOSPITAL_COMMUNITY): Payer: Self-pay

## 2024-05-26 ENCOUNTER — Telehealth: Payer: Self-pay

## 2024-05-26 NOTE — Telephone Encounter (Signed)
 Pharmacy Patient Advocate Encounter   Received notification from CoverMyMeds that prior authorization for Zepbound  10 is required/requested.   Insurance verification completed.   The patient is insured through [facility] .   Per test claim: Refill too soon. PA is not needed at this time. Medication was filled 05/10/24. Next eligible fill date is 05/30/24.

## 2024-06-05 DIAGNOSIS — Z419 Encounter for procedure for purposes other than remedying health state, unspecified: Secondary | ICD-10-CM | POA: Diagnosis not present

## 2024-07-06 DIAGNOSIS — Z419 Encounter for procedure for purposes other than remedying health state, unspecified: Secondary | ICD-10-CM | POA: Diagnosis not present

## 2024-07-18 ENCOUNTER — Other Ambulatory Visit: Payer: Self-pay | Admitting: Nurse Practitioner

## 2024-07-18 DIAGNOSIS — E039 Hypothyroidism, unspecified: Secondary | ICD-10-CM

## 2024-08-06 DIAGNOSIS — Z419 Encounter for procedure for purposes other than remedying health state, unspecified: Secondary | ICD-10-CM | POA: Diagnosis not present

## 2024-08-20 ENCOUNTER — Ambulatory Visit: Admitting: Nurse Practitioner

## 2024-08-20 NOTE — Progress Notes (Deleted)
   Established Patient Office Visit  Subjective   Patient ID: Kristy Wright, female    DOB: 12/09/1990  Age: 33 y.o. MRN: [id number]  No chief complaint on file.   HPI  Obesity: Patient currently maintained on Zepbound  10 mg weekly  Hypothyroidism: patient is currently on levothyroxine  200mcg daily   for complete physical and follow up of chronic conditions.  Immunizations: -Tetanus: Completed in 2024 -Influenza:  -Shingles: too young  -Pneumonia: too young   -HPV:  Diet: Fair diet.  Exercise: No regular exercise.  Eye exam: Completes annually  Dental exam: Completes semi-annually    Colonoscopy: Completed in  Lung Cancer Screening: Completed in   Pap smear: Due  Mammogram:  Dexa:  Sleep:   {History (Optional):23778}  ROS    Objective:     There were no vitals taken for this visit. {Vitals History (Optional):23777}  Physical Exam   No results found for any visits on 08/20/24.  {Labs (Optional):23779}  The ASCVD Risk score (Arnett DK, et al., 2019) failed to calculate for the following reasons:   The 2019 ASCVD risk score is only valid for ages 32 to 36    Assessment & Plan:   Problem List Items Addressed This Visit   None   No follow-ups on file.    Adina Crandall, NP

## 2024-09-16 ENCOUNTER — Encounter: Payer: Self-pay | Admitting: Nurse Practitioner

## 2024-09-20 NOTE — Telephone Encounter (Signed)
 Copied from CRM [id number]. Topic: General - Other >> Sep 20, 2024  1:42 PM Mercedes MATSU wrote: Reason for CRM: Patient called in wanting a status update on her FMLA paperwork that she submitted to be filled out via mychart on 10/24. Patient also requesting cb @ [phone].

## 2024-09-20 NOTE — Telephone Encounter (Signed)
 She needs a visit to discuss the paper work

## 2024-09-21 ENCOUNTER — Ambulatory Visit: Payer: Self-pay | Admitting: *Deleted

## 2024-09-21 ENCOUNTER — Telehealth: Payer: Self-pay | Admitting: Nurse Practitioner

## 2024-09-21 ENCOUNTER — Telehealth: Admitting: Family Medicine

## 2024-09-21 DIAGNOSIS — R519 Headache, unspecified: Secondary | ICD-10-CM

## 2024-09-21 DIAGNOSIS — R0981 Nasal congestion: Secondary | ICD-10-CM | POA: Diagnosis not present

## 2024-09-21 DIAGNOSIS — M791 Myalgia, unspecified site: Secondary | ICD-10-CM | POA: Diagnosis not present

## 2024-09-21 DIAGNOSIS — J320 Chronic maxillary sinusitis: Secondary | ICD-10-CM | POA: Diagnosis not present

## 2024-09-21 DIAGNOSIS — R52 Pain, unspecified: Secondary | ICD-10-CM

## 2024-09-21 NOTE — Telephone Encounter (Signed)
 Copied from CRM #[id number]. Topic: General - Other >> Sep 21, 2024 12:37 PM Alexandria E wrote: Reason for CRM: Patient is stating she is needing FMLA paperwork to be filled out for her employer, patient went to urgent care today where they diagnosed her with a sinus infection. UC advised patient to be out of work until 10/30.

## 2024-09-21 NOTE — Telephone Encounter (Signed)
 Can we see if she can be seen by a colleague today or if I have anything later in the week

## 2024-09-21 NOTE — Telephone Encounter (Signed)
 Patient was seen by urgent care today.  Does not want another appointment.  Pt states that her job needs work note filled out. Need office visit?

## 2024-09-21 NOTE — Progress Notes (Signed)
 Based on what you shared with me, I feel your condition warrants further evaluation as soon as possible at an Emergency department.    You reported a headache that is not like others in your past.    NOTE: There will be NO CHARGE for this eVisit   If you are having a true medical emergency please call 911.

## 2024-09-21 NOTE — Telephone Encounter (Addendum)
 When call completed, saw appt available today with other provider at practice but patient already disconnected from call and referred to UC. Please advise   FYI Only or Action Required?: Action required by provider: request for appointment and update on patient condition.  Patient was last seen in primary care on 05/10/2024 by Wendee Lynwood HERO, NP.  Called Nurse Triage reporting Headache.  Symptoms began yesterday.  Interventions attempted: OTC medications: goody powder, minimal relief and Rest, hydration, or home remedies.  Symptoms are: gradually worsening. 09/06/24 sx cough and runny nose got better and now headache and body aches.   Triage Disposition: See Physician Within 24 Hours  Patient/caregiver understands and will follow disposition?: No, wishes to speak with PCP patient requesting call back if possible due to missing work again.              Copied from CRM [id number]. Topic: Clinical - Red Word Triage >> Sep 21, 2024  8:46 AM Kristy Wright wrote: [facility] that prompted transfer to Nurse Triage: Patient states she  Has severe headaches since last night, runny nose, and really  bad body aches Reason for Disposition  [1] MODERATE headache (e.g., interferes with normal activities) AND [2] present > 24 hours AND [3] unexplained  (Exceptions: Pain medicines not tried, typical migraine, or headache part of viral illness.)  Answer Assessment - Initial Assessment Questions No available appt today or tomorrow. Offered appt Thursday and patient reports she would like to see provider due to she has called out of work again due to severe headache last night. Last week sick with cough and runny nose got better and now headache mild but severe body aches. Has not tested for covid or flu. Offered mobile bus in [place] but patient does not want to drive that far. Recommended UC. Patient requesting call back please.       1. LOCATION: Where does it hurt?      All over right and  left temple area 2. ONSET: When did the headache start? (e.g., minutes, hours, days)      Yesterday  3. PATTERN: Does the pain come and go, or has it been constant since it started?     Constant  4. SEVERITY: How bad is the pain? and What does it keep you from doing?  (e.g., Scale 1-10; mild, moderate, or severe)     Mild now but severe yesterday  5. RECURRENT SYMPTOM: Have you ever had headaches before? If Yes, ask: When was the last time? and What happened that time?      Yes but not like this  6. CAUSE: What do you think is causing the headache?     Not sure  7. MIGRAINE: Have you been diagnosed with migraine headaches? If Yes, ask: Is this headache similar?      na 8. HEAD INJURY: Has there been any recent injury to your head?      na 9. OTHER SYMPTOMS: Do you have any other symptoms? (e.g., fever, stiff neck, eye pain, sore throat, cold symptoms)     Headache, body aches , lightheaded, last week cough, runny nose on 09/06/24 and started feeling better and then feels sick again. Last night severe headache today is mild but body aches worsening. No fever  10. PREGNANCY: Is there any chance you are pregnant? When was your last menstrual period?       no  Protocols used: Headache-A-AH

## 2024-09-22 ENCOUNTER — Telehealth: Payer: Self-pay | Admitting: Nurse Practitioner

## 2024-09-22 NOTE — Telephone Encounter (Signed)
 Copied from CRM [id number]. Topic: Appointments - Scheduling Inquiry for Clinic >> Sep 22, 2024  2:22 PM Kristy Wright wrote: Reason for CRM: Pt is needing some FMLA paperwork filled out. Says she was told an appointment was needed. First available was not until 11/20. Went ahead and scheduled and added to wait list. But is needed to get paperwork filled out, does not want to lose job.

## 2024-09-22 NOTE — Telephone Encounter (Signed)
 She can see if the UC will give her a note since she was seen by them   If not will need an office visit

## 2024-09-22 NOTE — Telephone Encounter (Signed)
 Can we get patient scheduled in a same day slot please or any open slot

## 2024-09-22 NOTE — Telephone Encounter (Signed)
 See mychart messages

## 2024-09-24 ENCOUNTER — Ambulatory Visit: Admitting: Nurse Practitioner

## 2024-09-24 VITALS — BP 116/82 | HR 57 | Temp 97.5°F | Ht 62.0 in | Wt 218.2 lb

## 2024-09-24 DIAGNOSIS — J069 Acute upper respiratory infection, unspecified: Secondary | ICD-10-CM | POA: Insufficient documentation

## 2024-09-24 NOTE — Progress Notes (Signed)
   Established Patient Office Visit  Subjective   Patient ID: Kristy Wright, female    DOB: 27-Jan-1991  Age: 33 y.o. MRN: [id number]  Chief Complaint  Patient presents with   form consultation    Pt complains of needing work form filled out to go back to work.     HPI  09/06/2024-09/13/2024  09/19/2024-09/23/2024  Discussed the use of AI scribe software for clinical note transcription with the patient, who gave verbal consent to proceed.  History of Present Illness Kristy Wright is a 33 year old female who presents with persistent respiratory symptoms following an upper respiratory infection.  She initially experienced symptoms from October 13th to October 20th, characterized by a severe cough that significantly impacted her daily activities. During this period, she managed her symptoms through a [employer] consultation.  After returning to work from October 20th to October 22nd, she left early on the 22nd due to ongoing symptoms. On October 28th, she sought care at [facility] and was prescribed Augmentin , which she is currently taking with a few days remaining. She reports no adverse effects from the medication.  Currently, she has a mild runny nose and cough, but no fever, chills, sore throat, ear pain, or pressure. Her energy levels have not fully returned to normal, but she denies shortness of breath, nausea, vomiting, diarrhea, lightheadedness, dizziness, or headaches. The cough is productive of mucus.    Review of Systems  Constitutional:  Positive for malaise/fatigue. Negative for chills and fever.  HENT:  Negative for ear discharge, ear pain, sinus pain and sore throat.   Respiratory:  Positive for cough and sputum production. Negative for shortness of breath.   Gastrointestinal:  Negative for abdominal pain, diarrhea, nausea and vomiting.  Neurological:  Negative for dizziness and headaches.      Objective:     BP 116/82   Pulse (!) 57   Temp (!) 97.5 F (36.4 C)  (Oral)   Ht 5' 2 (1.575 m)   Wt 218 lb 3.2 oz (99 kg)   SpO2 97%   BMI 39.91 kg/m    Physical Exam Vitals and nursing note reviewed.  Constitutional:      Appearance: Normal appearance.  Cardiovascular:     Rate and Rhythm: Normal rate and regular rhythm.     Heart sounds: Normal heart sounds.  Pulmonary:     Effort: Pulmonary effort is normal.     Comments: Decreased in RLL Abdominal:     General: Bowel sounds are normal.  Neurological:     Mental Status: She is alert.      No results found for any visits on 09/24/24.    The ASCVD Risk score (Arnett DK, et al., 2019) failed to calculate for the following reasons:   The 2019 ASCVD risk score is only valid for ages 52 to 44    Assessment & Plan:   Problem List Items Addressed This Visit       Respiratory   Upper respiratory tract infection - Primary  Assessment and Plan Assessment & Plan Acute upper respiratory infection Acute URI with rhinorrhea, cough, and mucus production. No fever or other significant symptoms. Tolerating Augmentin  well. - Continue Augmentin . - Ensure afebrile before work. - Complete FMLA paperwork.   Return if symptoms worsen or fail to improve, for as scheduled .    Adina Crandall, NP

## 2024-09-24 NOTE — Patient Instructions (Signed)
 Nice to see you today If you are fever free you can return to work on Sunday Keep the appt with me as scheduled and we will make that your physical

## 2024-10-08 ENCOUNTER — Ambulatory Visit: Admitting: Nurse Practitioner

## 2024-10-09 LAB — LAB REPORT - SCANNED: EGFR: 116

## 2024-10-14 ENCOUNTER — Ambulatory Visit: Admitting: Nurse Practitioner

## 2024-10-27 ENCOUNTER — Telehealth: Payer: Self-pay | Admitting: Nurse Practitioner

## 2024-10-27 NOTE — Telephone Encounter (Signed)
 Copied from CRM [id number]. Topic: General - Other >> Oct 27, 2024 12:18 PM Alexandria E wrote: Reason for CRM: Patient stated she had her lab work done at a facility outside of [employer] office. Patient requested the results to be faxed to PCP, as this was done around 11/19. Patient calling in to confirm if fax was received.

## 2024-10-28 NOTE — Telephone Encounter (Signed)
 Left detailed voicemail for patient to call the office back.

## 2024-10-28 NOTE — Telephone Encounter (Unsigned)
 Copied from CRM #[id number]. Topic: Clinical - Lab/Test Results >> Oct 28, 2024  2:07 PM Mercedes MATSU wrote: Reason for CRM: Patient called in stating that her labs were faxed over. She said she received a message from Janae saying they were not. After checking the chart I do see the patients lab work from the bariatric clinic scanned into the chart. Patient is requesting a call back from Janae and can be reached at [phone] she wants to confirm that Janae did receive the lab work.

## 2024-11-05 DIAGNOSIS — Z419 Encounter for procedure for purposes other than remedying health state, unspecified: Secondary | ICD-10-CM | POA: Diagnosis not present

## 2024-11-09 NOTE — Telephone Encounter (Signed)
 Pt is requesting lab work to be reviewed. States that her thyroid  is feeling off.

## 2024-11-09 NOTE — Telephone Encounter (Signed)
 Copied from CRM #[id number]. Topic: Clinical - Lab/Test Results >> Oct 28, 2024  2:07 PM Mercedes MATSU wrote: Reason for CRM: Patient called in stating that her labs were faxed over. She said she received a message from [facility] Fritze saying they were not. After checking the chart I do see the patients lab work from the bariatric clinic scanned into the chart. Patient is requesting a call back from Lory Nowaczyk and can be reached at [phone] she wants to confirm that Willena Jeancharles did receive the lab work. >> Nov 09, 2024  3:16 PM Mercedes MATSU wrote: Patient called asking for an update on her paper work. She said she has called prior as well as wrote the nurse on mychart. The patient is requesting a call back and can be reached at [phone].

## 2024-11-10 NOTE — Telephone Encounter (Signed)
 Pt is scheduled for non fasting labs to recheck thyroid  function.

## 2024-11-10 NOTE — Telephone Encounter (Signed)
 TSH was slightly elevated. Can we set her up with a non fasting lab here to recheck the thyroid  functions. I have placed the lab orders

## 2024-11-10 NOTE — Addendum Note (Signed)
 Addended by: WENDEE LYNWOOD HERO on: 11/10/2024 12:41 PM   Modules accepted: Orders

## 2024-11-11 ENCOUNTER — Other Ambulatory Visit (INDEPENDENT_AMBULATORY_CARE_PROVIDER_SITE_OTHER)

## 2024-11-11 DIAGNOSIS — E039 Hypothyroidism, unspecified: Secondary | ICD-10-CM | POA: Diagnosis not present

## 2024-11-11 LAB — T4, FREE: Free T4: 0.83 ng/dL (ref 0.60–1.60)

## 2024-11-11 LAB — TSH: TSH: 1.38 u[IU]/mL (ref 0.35–5.50)

## 2024-11-11 LAB — T3, FREE: T3, Free: 3.6 pg/mL (ref 2.3–4.2)

## 2024-11-15 ENCOUNTER — Encounter: Payer: Self-pay | Admitting: Nurse Practitioner

## 2024-11-15 NOTE — Telephone Encounter (Signed)
 Will you need to see in office?

## 2024-11-16 ENCOUNTER — Ambulatory Visit: Payer: Self-pay | Admitting: Nurse Practitioner

## 2024-11-19 ENCOUNTER — Other Ambulatory Visit: Payer: Self-pay | Admitting: Family

## 2024-11-19 DIAGNOSIS — E039 Hypothyroidism, unspecified: Secondary | ICD-10-CM

## 2024-11-22 NOTE — Telephone Encounter (Signed)
 Given last when you were out by another provider.   Lab Results  Component Value Date   TSH 1.38 11/11/2024

## 2024-11-29 ENCOUNTER — Ambulatory Visit: Admitting: Nurse Practitioner

## 2024-12-10 ENCOUNTER — Ambulatory Visit (INDEPENDENT_AMBULATORY_CARE_PROVIDER_SITE_OTHER): Admitting: Nurse Practitioner

## 2024-12-10 MED ORDER — ZEPBOUND 2.5 MG/0.5ML ~~LOC~~ SOAJ: 2.5000 mg | SUBCUTANEOUS | 0 refills | Status: AC

## 2024-12-10 NOTE — Progress Notes (Signed)
 "  Established Patient Office Visit  Subjective   Patient ID: Kristy Wright, female    DOB: 07-May-1991  Age: 34 y.o. MRN: [id number]  Chief Complaint  Patient presents with   Medication Management    Pt would like to discuss weight loss medications.     HPI  Discussed the use of AI scribe software for clinical note transcription with the patient, who gave verbal consent to proceed.  History of Present Illness Kristy Wright is a 34 year old female who presents with concerns about weight loss options.  She has previously tried several medications for weight loss, including Zepbound , phentermine , and Wegovy . Zepbound  was initially effective at lower doses (2.5 mg and 5 mg) without symptoms, but she experienced intolerable side effects, including vomiting, at a higher dose of 7.5 mg. Insurance coverage for Zepbound  was discontinued in October of the previous year but has since been reinstated with different requirements.  Phentermine  did not significantly reduce her hunger, and Wegovy  caused major headaches and vomiting, making it intolerable. She wants to find a medication that effectively manages her weight without severe side effects.  She eats three meals a day but often feels hungry between meals, leading to frequent snacking. She primarily drinks water and zero-calorie flavored water drops. She does not engage in regular exercise, citing the demands of caring for her young child as her primary physical activity.  No fever, chills, chest pain, or shortness of breath.    Review of Systems  Constitutional:  Negative for chills and fever.  Respiratory:  Negative for shortness of breath.   Cardiovascular:  Negative for chest pain.      Objective:     BP 130/88   Pulse 71   Temp 98.1 F (36.7 C) (Oral)   Ht 5' 2 (1.575 m)   Wt 227 lb 12.8 oz (103.3 kg)   SpO2 98%   BMI 41.67 kg/m  BP Readings from Last 3 Encounters:  12/10/24 130/88  09/24/24 116/82  08/01/23 96/80    Wt Readings from Last 3 Encounters:  12/10/24 227 lb 12.8 oz (103.3 kg)  09/24/24 218 lb 3.2 oz (99 kg)  05/10/24 228 lb (103.4 kg)   SpO2 Readings from Last 3 Encounters:  12/10/24 98%  09/24/24 97%  08/01/23 95%      Physical Exam Vitals and nursing note reviewed.  Constitutional:      Appearance: Normal appearance.  Cardiovascular:     Rate and Rhythm: Normal rate and regular rhythm.     Heart sounds: Normal heart sounds.  Pulmonary:     Effort: Pulmonary effort is normal.     Breath sounds: Normal breath sounds.  Neurological:     Mental Status: She is alert.      No results found for any visits on 12/10/24.    The ASCVD Risk score (Arnett DK, et al., 2019) failed to calculate for the following reasons:   The 2019 ASCVD risk score is only valid for ages 44 to 71   * - Cholesterol units were assumed    Assessment & Plan:   Problem List Items Addressed This Visit       Other   Morbid obesity (HCC) - Primary   Relevant Medications   tirzepatide  (ZEPBOUND ) 2.5 MG/0.5ML Pen   Assessment and Plan Assessment & Plan Obesity Zepbound  effective at lower doses but caused vomiting at higher doses. Phentermine  and Wegovy  not tolerated. BMI qualifies for weight loss medication. Discussed Qsymia, Contrave, and injectables  like Zepbound  and Saxenda. Emphasized exercise and dietary management. Discussed side effects of injectables. Not safe in pregnancy, but IUD provides contraception. - Submitted prescription for Zepbound . - Scheduled a three-month follow-up appointment. - Provided information on the [facility] for dietary management. - Discussed alternative weight loss medications if Zepbound  is not covered.  General Health Maintenance Discussed importance of regular exercise and dietary habits to avoid excess calorie intake. - Encouraged regular exercise, aiming for five days a week, thirty minutes at a time. - Advised on dietary habits to  avoid grazing and manage calorie intake.   Return in about 3 months (around 03/10/2025) for weight recheck .    Adina Crandall, NP  "

## 2024-12-10 NOTE — Patient Instructions (Signed)
 Nice to see you today  The pill medications are qysmia and [employer] [facility]  Address: [address] [place], [place], [place] [address] Hours:  Closes soon ? 5?PM ? Opens 7?AM Tue Confirmed by this business 7 weeks ago Phone: [phone]

## 2024-12-16 ENCOUNTER — Encounter: Payer: Self-pay | Admitting: Nurse Practitioner

## 2024-12-16 ENCOUNTER — Other Ambulatory Visit (HOSPITAL_COMMUNITY): Payer: Self-pay

## 2024-12-16 ENCOUNTER — Telehealth: Payer: Self-pay

## 2024-12-16 NOTE — Telephone Encounter (Signed)
 Pharmacy Patient Advocate Encounter   Received notification from [employer] that prior authorization for Zepbound  2.5MG /0.5ML pen-injectors is required/requested.   Insurance verification completed.   The patient is insured through [facility] MEDICAID.   Per test claim: PA required; PA submitted to above mentioned insurance via Latent Key/confirmation #/EOC [id number] Status is pending

## 2024-12-17 ENCOUNTER — Other Ambulatory Visit (HOSPITAL_COMMUNITY): Payer: Self-pay

## 2024-12-17 NOTE — Telephone Encounter (Signed)
 Pharmacy Patient Advocate Encounter  Received notification from [facility] MEDICAID that Prior Authorization for Zepbound  2.5MG /0.5ML pen-injectors has been APPROVED from 12/16/24 to 06/14/25. Ran test claim, Copay is $4. This test claim was processed through [facility]- copay amounts may vary at other pharmacies due to pharmacy/plan contracts, or as the patient moves through the different stages of their insurance plan.   PA #/Case ID/Reference #: [id number]

## 2025-03-10 ENCOUNTER — Ambulatory Visit: Admitting: Nurse Practitioner
